# Patient Record
Sex: Male | Born: 1945 | Race: White | Hispanic: No | Marital: Married | Smoking: Never smoker
Health system: Southern US, Community
[De-identification: ages and names within clinical notes are randomized; demographics above are authoritative.]

## PROBLEM LIST (undated history)

## (undated) DIAGNOSIS — K219 Gastro-esophageal reflux disease without esophagitis: Secondary | ICD-10-CM

## (undated) DIAGNOSIS — E559 Vitamin D deficiency, unspecified: Secondary | ICD-10-CM

## (undated) DIAGNOSIS — C61 Malignant neoplasm of prostate: Secondary | ICD-10-CM

## (undated) DIAGNOSIS — R209 Unspecified disturbances of skin sensation: Secondary | ICD-10-CM

## (undated) DIAGNOSIS — S32401A Unspecified fracture of right acetabulum, initial encounter for closed fracture: Secondary | ICD-10-CM

## (undated) DIAGNOSIS — I10 Essential (primary) hypertension: Secondary | ICD-10-CM

## (undated) HISTORY — DX: Unspecified disturbances of skin sensation: R20.9

## (undated) HISTORY — DX: Essential (primary) hypertension: I10

## (undated) HISTORY — DX: Vitamin D deficiency, unspecified: E55.9

## (undated) HISTORY — DX: Unspecified fracture of right acetabulum, initial encounter for closed fracture: S32.401A

## (undated) HISTORY — PX: TONSILLECTOMY: SHX5217

---

## 1999-09-20 ENCOUNTER — Encounter: Payer: Self-pay | Admitting: Emergency Medicine

## 1999-09-20 ENCOUNTER — Emergency Department (HOSPITAL_COMMUNITY): Admission: EM | Admit: 1999-09-20 | Discharge: 1999-09-20 | Payer: Self-pay | Admitting: Emergency Medicine

## 2004-03-19 ENCOUNTER — Ambulatory Visit (HOSPITAL_COMMUNITY): Admission: RE | Admit: 2004-03-19 | Discharge: 2004-03-19 | Payer: Self-pay | Admitting: Gastroenterology

## 2004-03-19 ENCOUNTER — Encounter (INDEPENDENT_AMBULATORY_CARE_PROVIDER_SITE_OTHER): Payer: Self-pay | Admitting: Specialist

## 2004-05-05 ENCOUNTER — Encounter: Admission: RE | Admit: 2004-05-05 | Discharge: 2004-05-05 | Payer: Self-pay | Admitting: Family Medicine

## 2005-11-08 ENCOUNTER — Encounter: Admission: RE | Admit: 2005-11-08 | Discharge: 2005-11-08 | Payer: Self-pay | Admitting: Endocrinology

## 2007-01-10 ENCOUNTER — Encounter: Admission: RE | Admit: 2007-01-10 | Discharge: 2007-01-10 | Payer: Self-pay | Admitting: Otolaryngology

## 2009-01-05 ENCOUNTER — Ambulatory Visit (HOSPITAL_COMMUNITY): Admission: RE | Admit: 2009-01-05 | Discharge: 2009-01-05 | Payer: Self-pay | Admitting: *Deleted

## 2009-10-04 ENCOUNTER — Encounter: Payer: Self-pay | Admitting: Internal Medicine

## 2009-10-06 ENCOUNTER — Encounter: Payer: Self-pay | Admitting: Internal Medicine

## 2009-12-06 ENCOUNTER — Ambulatory Visit: Payer: Self-pay | Admitting: Internal Medicine

## 2010-03-16 ENCOUNTER — Ambulatory Visit: Payer: Self-pay | Admitting: Internal Medicine

## 2010-03-16 DIAGNOSIS — R209 Unspecified disturbances of skin sensation: Secondary | ICD-10-CM

## 2010-03-16 HISTORY — DX: Unspecified disturbances of skin sensation: R20.9

## 2010-04-19 ENCOUNTER — Encounter: Payer: Self-pay | Admitting: Internal Medicine

## 2010-07-31 NOTE — Letter (Signed)
Summary: PATIENT HX FORMS  PATIENT HX FORMS   Imported By: Georgian Co 04/19/2010 15:27:47  _____________________________________________________________________  External Attachment:    Type:   Image     Comment:   External Document

## 2010-07-31 NOTE — Assessment & Plan Note (Signed)
Summary: burning sensation in genital area for 2 days/cjr   Vital Signs:  Patient profile:   65 year old male Weight:      152 pounds Temp:     98.3 degrees F oral BP sitting:   120 / 78  (right arm) Cuff size:   regular  Vitals Entered By: Duard Brady LPN (March 16, 2010 11:05 AM) CC: c/o skin 'burning sensation'       **declines Flu vaccine Is Patient Diabetic? No   CC:  c/o skin 'burning sensation'       **declines Flu vaccine.  History of Present Illness: 65 year old patient who is seen today complain of some burning type sensation in the lower abdominal area.  He describes a burning, sensation it's very superficial in the skin region.  He also notes a similar sensation in the legs and left shoulder.  This was a similar complaints last spring and his previous PCP ordered LS films and laboratory evaluation, which was nonrevealing.  He states the discomfort has been present for two days.  He denies any bowel or bladder symptoms.  Allergies (verified): No Known Drug Allergies  Past History:  Past Medical History: Reviewed history from 12/06/2009 and no changes required. history of hypertension  Review of Systems  The patient denies anorexia, fever, weight loss, weight gain, vision loss, decreased hearing, hoarseness, chest pain, syncope, dyspnea on exertion, peripheral edema, prolonged cough, headaches, hemoptysis, abdominal pain, melena, hematochezia, severe indigestion/heartburn, hematuria, incontinence, genital sores, muscle weakness, suspicious skin lesions, transient blindness, difficulty walking, depression, unusual weight change, abnormal bleeding, enlarged lymph nodes, angioedema, breast masses, and testicular masses.    Physical Exam  General:  Well-developed,well-nourished,in no acute distress; alert,appropriate and cooperative throughout examination Head:  Normocephalic and atraumatic without obvious abnormalities. No apparent alopecia or balding. Eyes:   No corneal or conjunctival inflammation noted. EOMI. Perrla. Funduscopic exam benign, without hemorrhages, exudates or papilledema. Vision grossly normal. Mouth:  Oral mucosa and oropharynx without lesions or exudates.  Teeth in good repair. Neck:  No deformities, masses, or tenderness noted. Lungs:  Normal respiratory effort, chest expands symmetrically. Lungs are clear to auscultation, no crackles or wheezes. Heart:  Normal rate and regular rhythm. S1 and S2 normal without gallop, murmur, click, rub or other extra sounds. Abdomen:  Bowel sounds positive,abdomen soft and non-tender without masses, organomegaly or hernias noted. Msk:  No deformity or scoliosis noted of thoracic or lumbar spine.   Extremities:  No clubbing, cyanosis, edema, or deformity noted with normal full range of motion of all joints.   Neurologic:  alert & oriented X3, cranial nerves II-XII intact, strength normal in all extremities, sensation intact to pinprick, gait normal, and DTRs symmetrical and normal.     Impression & Recommendations:  Problem # 1:  PARESTHESIA (ICD-782.0)  Complete Medication List: 1)  No Rx Current Rx Meds   Patient Instructions: 1)  Please schedule a follow-up appointment in 6 months for Annual Exam  2)  Limit your Sodium (Salt) to less than 2 grams a day(slightly less than 1/2 a teaspoon) to prevent fluid retention, swelling, or worsening of symptoms. 3)  It is important that you exercise regularly at least 20 minutes 5 times a week. If you develop chest pain, have severe difficulty breathing, or feel very tired , stop exercising immediately and seek medical attention. 4)  Take 400-600mg  of Ibuprofen (Advil, Motrin) with food every 4-6 hours as needed for relief of pain or comfort of fever.

## 2010-07-31 NOTE — Assessment & Plan Note (Signed)
Summary: to be est/njr   Vital Signs:  Patient profile:   65 year old male Height:      66.25 inches Weight:      150 pounds BMI:     24.12 Temp:     98.0 degrees F oral BP sitting:   126 / 80  (right arm) Cuff size:   regular  Vitals Entered By: Duard Brady LPN (December 07, 1658 9:30 AM) CC: new to establish Is Patient Diabetic? No   CC:  new to establish.  History of Present Illness: 65 year old patient who is seen today to establish with our  practice.  he has a history of hypertension, which has been clean with Benicar in the past, but presently is followed off medication.  He takes no chronic medications.  He feels well today without concerns or complaints.  He did have a screening colonoscopy.  Last year.  Past medical history is otherwise unremarkable. He states that he has had some lower abdominal burning discomfort that has improved greatly since he has cut down on his coffee consumption.  Preventive Screening-Counseling & Management  Alcohol-Tobacco     Smoking Status: never  Allergies (verified): No Known Drug Allergies  Past History:  Past Medical History: history of hypertension  Past Surgical History: Tonsillectomy 1955  Family History: Reviewed history and no changes required. father died in his 90s, probable coronary artery disease mother is 65, resident of friends home 4 sisters are in good health  Social History: Reviewed history and no changes required. 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    Physical Exam  General:  Well-developed,well-nourished,in no acute distress; alert,appropriate and cooperative throughout examination; 130/80 Eyes:  No corneal or conjunctival inflammation noted. EOMI. Perrla. Funduscopic exam benign, without hemorrhages, exudates or papilledema. Vision grossly normal. Ears:  External ear exam shows no significant lesions or deformities.  Otoscopic examination reveals clear canals, tympanic membranes are intact bilaterally without bulging, retraction, inflammation or discharge. Hearing is grossly normal bilaterally. Nose:  External nasal examination shows no deformity or inflammation. Nasal mucosa are pink and moist without lesions or exudates. Mouth:  Oral mucosa and oropharynx without lesions or exudates.  Teeth in good repair. Neck:  No deformities, masses, or tenderness noted. Chest Wall:  No deformities, masses, tenderness or gynecomastia noted. Breasts:  No masses or gynecomastia noted Lungs:  Normal respiratory effort, chest expands symmetrically. Lungs are clear to auscultation, no crackles or wheezes. Heart:  Normal rate and regular rhythm. S1 and S2 normal without gallop, murmur, click, rub or other extra sounds. Abdomen:  Bowel sounds positive,abdomen soft and non-tender without masses, organomegaly or hernias noted. Rectal:  No external abnormalities noted. Normal sphincter tone. No rectal masses or tenderness. Genitalia:  Testes bilaterally descended without nodularity, tenderness or masses. No scrotal masses or lesions. No penis lesions or urethral discharge. Prostate:  Prostate gland firm and smooth, no enlargement, nodularity, tenderness, mass, asymmetry or induration. Msk:  No deformity or scoliosis noted of thoracic or lumbar spine.   Pulses:  R and L carotid,radial,femoral,dorsalis pedis and posterior tibial pulses are full and  equal  bilaterally Extremities:  No clubbing, cyanosis, edema, or deformity noted with normal full range of motion of all joints.   Neurologic:  No cranial nerve deficits noted. Station and gait are normal. Plantar reflexes are down-going bilaterally. DTRs are symmetrical throughout. Sensory, motor and coordinative functions appear intact. Skin:  Intact without suspicious lesions or rashes Cervical Nodes:  No lymphadenopathy noted Axillary Nodes:  No palpable lymphadenopathy Inguinal Nodes:  No significant adenopathy Psych:  Cognition and judgment appear intact. Alert and cooperative with normal attention span and concentration. No apparent delusions, illusions, hallucinations   Impression & Recommendations:  Problem # 1:  Preventive Health Care (ICD-V70.0)  Complete Medication List: 1)  No Rx Current Rx Meds   Patient Instructions: 1)  It is important that you exercise regularly at least 20 minutes 5 times a week. If you develop chest pain, have severe difficulty breathing, or feel very tired , stop exercising immediately and seek medical attention. 2)  Check your Blood Pressure regularly. If it is above: 150/90 you should make an appointment. 3)  Please schedule a follow-up appointment in 1 year.

## 2010-07-31 NOTE — Letter (Signed)
Summary: Records from [facility] 2010 - 2011  Records from [facility] 2010 - 2011   Imported By: Maryln Gottron 12/27/2009 15:18:21  _____________________________________________________________________  External Attachment:    Type:   Image     Comment:   External Document

## 2010-09-11 ENCOUNTER — Encounter: Payer: Self-pay | Admitting: Internal Medicine

## 2010-09-12 ENCOUNTER — Ambulatory Visit (INDEPENDENT_AMBULATORY_CARE_PROVIDER_SITE_OTHER): Payer: BC Managed Care – PPO | Admitting: Internal Medicine

## 2010-09-12 ENCOUNTER — Encounter: Payer: Self-pay | Admitting: Internal Medicine

## 2010-09-12 VITALS — BP 122/80 | Temp 97.9°F | Wt 152.0 lb

## 2010-09-12 DIAGNOSIS — R42 Dizziness and giddiness: Secondary | ICD-10-CM

## 2010-09-12 MED ORDER — MECLIZINE HCL 25 MG PO TABS: 25.0000 mg | ORAL_TABLET | Freq: Three times a day (TID) | ORAL | Status: DC | PRN

## 2010-09-12 NOTE — Progress Notes (Signed)
  Subjective:    Patient ID: Leslie Cox, male    DOB: [birth year], 65 y.o.   MRN: [id number]  HPI   65 year old patient who presents with two-day history of some cough and hoarseness. His chief complaint is positional dizziness he describes a sense of mild spinning and disequilibrium. No nausea or falls. No fever. No prior history of vertigo. He takes no chronic medications.    Review of Systems  Constitutional: Negative for fever, chills, appetite change and fatigue.  HENT: Positive for congestion. Negative for hearing loss, ear pain, sore throat, trouble swallowing, neck stiffness, dental problem, voice change and tinnitus.   Eyes: Negative for pain, discharge and visual disturbance.  Respiratory: Negative for cough, chest tightness, wheezing and stridor.   Cardiovascular: Negative for chest pain, palpitations and leg swelling.  Gastrointestinal: Negative for nausea, vomiting, abdominal pain, diarrhea, constipation, blood in stool and abdominal distention.  Genitourinary: Negative for urgency, hematuria, flank pain, discharge, difficulty urinating and genital sores.  Musculoskeletal: Negative for myalgias, back pain, joint swelling, arthralgias and gait problem.  Skin: Negative for rash.  Neurological: Positive for light-headedness. Negative for dizziness, syncope, speech difficulty, weakness, numbness and headaches.  Hematological: Negative for adenopathy. Does not bruise/bleed easily.  Psychiatric/Behavioral: Negative for behavioral problems and dysphoric mood. The patient is not nervous/anxious.        Objective:   Physical Exam  Constitutional: He is oriented to person, place, and time. He appears well-developed and well-nourished. No distress.  HENT:  Head: Normocephalic and atraumatic.  Right Ear: External ear normal.  Left Ear: External ear normal.  Nose: Nose normal.  Mouth/Throat: Oropharynx is clear and moist.  Eyes: Conjunctivae and EOM are normal. Pupils are equal,  round, and reactive to light.  Neck: Normal range of motion.  Cardiovascular: Normal rate and normal heart sounds.   Pulmonary/Chest: Breath sounds normal.  Abdominal: Bowel sounds are normal.  Musculoskeletal: Normal range of motion. He exhibits no edema and no tenderness.  Neurological: He is alert and oriented to person, place, and time.        Finger to nose testing normal  Psychiatric: He has a normal mood and affect. His behavior is normal.          Assessment & Plan:   URI with mild associated vertigo. He has been asked to take a once a day nonsedating antihistamine such as Claritin Alavert or Allegra. He was given a prescription for meclizine if symptoms worsen. He will be seen later this year for his annual physical

## 2010-09-12 NOTE — Patient Instructions (Signed)
Call or return to clinic prn if these symptoms worsen or fail to improve as anticipated.   Annual exam later this year as scheduled

## 2010-11-13 NOTE — Op Note (Signed)
NAME:  Leslie Cox, Leslie Cox NO.:  [id number]   MEDICAL RECORD NO.:  [id number]          PATIENT TYPE:  AMB   LOCATION:  ENDO                         FACILITY:  [facility]   PHYSICIAN:  Georgiana Spinner, M.D.    DATE OF BIRTH:  December 26, 1945   DATE OF PROCEDURE:  DATE OF DISCHARGE:                               OPERATIVE REPORT   PROCEDURE:  Colonoscopy.   INDICATIONS:  Colon cancer screening.   ANESTHESIA:  Fentanyl 50 mcg, Versed 5 mg.   PROCEDURE:  With the patient mildly sedated in the left lateral  decubitus position, a rectal exam was performed which was unremarkable.  The prostate felt slightly enlarged, but non-nodular and nontender.  Subsequently, the Pentax videoscopic pediatric colonoscope was inserted  in the rectum and passed under direct vision to the cecum, identified by  ileocecal valve and appendiceal orifice both of which were photographed.  From this point, the colonoscope was slowly withdrawn taking  circumferential views of colonic mucosa, stopping only in the rectum  which appeared normal on direct and retroflexed view.  The endoscope was  straightened and withdrawn.  The patient's vital signs and pulse  oximeter remained stable.  The patient tolerated procedure well without  apparent complications.   FINDINGS:  Negative examination.   PLAN:  Consider repeat examination in 5-10 years.           ______________________________  Georgiana Spinner, M.D.     GMO/MEDQ  D:  01/05/2009  T:  01/05/2009  Job:  [id number]

## 2010-11-16 NOTE — Op Note (Signed)
NAME:  Leslie Cox, Leslie Cox                        ACCOUNT NO.:  [id number]   MEDICAL RECORD NO.:  [id number]                   PATIENT TYPE:  AMB   LOCATION:  ENDO                                 FACILITY[facility]  [facility]   PHYSICIAN:  Danise Edge, M.D.                DATE OF BIRTH:  1946-01-17   DATE OF PROCEDURE:  03/19/2004  DATE OF DISCHARGE:                                 OPERATIVE REPORT   PROCEDURE:  Screening colonoscopy.   INDICATIONS FOR PROCEDURE:  Mr. Leslie Cox is a 65 year old male born  1945-11-17.  Mr. Leslie Cox is scheduled to undergo his first screening  colonoscopy with polypectomy to prevent colon cancer.   ENDOSCOPIST:  Charolett Bumpers, M.D.   PREMEDICATION:  Versed 5 mg, Demerol 50 mg.   PROCEDURE:  After obtaining confirmed consent, Leslie Cox was placed in the  left lateral decubitus position.  I administered intravenous Demerol and  intravenous Versed to achieve conscious sedation for the procedure.  The  patient's blood pressure, oxygen saturation, and cardiac rhythm were  monitored throughout the procedure and documented in the medical record.  Anal inspection and digital rectal exam were normal.  The Olympus pediatric  video colonoscope was introduced into the rectum and advanced to the cecum.  Colonic preparation for the exam today was excellent.   Rectum normal.  Sigmoid colon and descending colon:  From the distal descending colon at 50  cm from the anal verge, a 2 mm sessile polyp was removed with the cold  biopsy forceps.  Splenic flexure normal.  Transverse colon normal.  Hepatic flexure normal.  Ascending colon normal.  Cecum and ileocecal valve normal.   ASSESSMENT:  A small polyp was removed from the distal descending colon;  otherwise normal screening proctocolonoscopy to the cecum.     MJ/MEDQ  D:  03/19/2004  T:  03/19/2004  Job:  [id number]   cc:   Donia Guiles, M.D.  [address] [place]  [place] [address]  Fax: [phone]

## 2010-12-18 ENCOUNTER — Telehealth: Payer: Self-pay

## 2010-12-18 NOTE — Telephone Encounter (Signed)
Took overhead page call - c/o chest discomfort x1wk , no SOB , no Gerd , c/o (R) shoulder and hand tingling on/off. Offer appt today declined - appt made for tomorrow 915AM

## 2010-12-19 ENCOUNTER — Encounter: Payer: Self-pay | Admitting: Internal Medicine

## 2010-12-19 ENCOUNTER — Ambulatory Visit (INDEPENDENT_AMBULATORY_CARE_PROVIDER_SITE_OTHER): Payer: BC Managed Care – PPO | Admitting: Internal Medicine

## 2010-12-19 VITALS — BP 140/82 | Temp 97.8°F | Wt 151.0 lb

## 2010-12-19 DIAGNOSIS — R0789 Other chest pain: Secondary | ICD-10-CM

## 2010-12-19 NOTE — Progress Notes (Signed)
  Subjective:    Patient ID: Leslie Cox, male    DOB: [birth year], 65 y.o.   MRN: [id number]  HPI   65 year old patient who presents with a one-week history of chest discomfort. Last week this was present in the right upper anterior chest just beneath his right shoulder.  More recently it is present in the right mid chest region. He feels this is a muscular pain in this area is often tender. Denies any exertional chest pain. He has no cardiac risk factors. He feels it's a muscle strain. He has had minimal benefit with ibuprofen and especially Tylenol. Denies any shortness breath nausea diaphoresis or other complaints   Review of Systems  Respiratory: Negative for shortness of breath.   Cardiovascular: Positive for chest pain. Negative for palpitations.       Objective:   Physical Exam  Constitutional: He is oriented to person, place, and time. He appears well-developed.  HENT:  Head: Normocephalic.  Right Ear: External ear normal.  Left Ear: External ear normal.  Eyes: Conjunctivae and EOM are normal.  Neck: Normal range of motion.  Cardiovascular: Normal rate and normal heart sounds.   Pulmonary/Chest: Effort normal and breath sounds normal. No respiratory distress. He has no wheezes. He has no rales. He exhibits no tenderness.       Suggestion of tenderness along the right anterior chest  Abdominal: Bowel sounds are normal.  Musculoskeletal: Normal range of motion. He exhibits no edema and no tenderness.  Neurological: He is alert and oriented to person, place, and time.  Psychiatric: He has a normal mood and affect. His behavior is normal.          Assessment & Plan:   Chest wall pain. We'll treat with VIMOVO  twice a day and clinically observe

## 2010-12-19 NOTE — Patient Instructions (Signed)
Call or return to clinic prn if these symptoms worsen or fail to improve as anticipated.  Vimovo 1 twice daily

## 2011-02-07 ENCOUNTER — Other Ambulatory Visit (INDEPENDENT_AMBULATORY_CARE_PROVIDER_SITE_OTHER): Payer: BC Managed Care – PPO

## 2011-02-07 DIAGNOSIS — Z0000 Encounter for general adult medical examination without abnormal findings: Secondary | ICD-10-CM

## 2011-02-07 LAB — CBC WITH DIFFERENTIAL/PLATELET
Basophils Absolute: 0 10*3/uL (ref 0.0–0.1)
Eosinophils Absolute: 0.1 10*3/uL (ref 0.0–0.7)
Eosinophils Relative: 1.8 % (ref 0.0–5.0)
HCT: 45.6 % (ref 39.0–52.0)
Lymphs Abs: 1.3 10*3/uL (ref 0.7–4.0)
MCHC: 33.6 g/dL (ref 30.0–36.0)
MCV: 91.9 fl (ref 78.0–100.0)
Monocytes Absolute: 0.5 10*3/uL (ref 0.1–1.0)
Neutrophils Relative %: 67 % (ref 43.0–77.0)
Platelets: 236 10*3/uL (ref 150.0–400.0)
RDW: 12.9 % (ref 11.5–14.6)
WBC: 5.6 10*3/uL (ref 4.5–10.5)

## 2011-02-07 LAB — LIPID PANEL
Cholesterol: 198 mg/dL (ref 0–200)
HDL: 37.4 mg/dL — ABNORMAL LOW (ref >39.00)
LDL Cholesterol: 127 mg/dL — ABNORMAL HIGH (ref 0–99)
Total CHOL/HDL Ratio: 5
Triglycerides: 168 mg/dL — ABNORMAL HIGH (ref 0.0–149.0)
VLDL: 33.6 mg/dL (ref 0.0–40.0)

## 2011-02-07 LAB — BASIC METABOLIC PANEL
BUN: 11 mg/dL (ref 6–23)
Chloride: 102 mEq/L (ref 96–112)
Creatinine, Ser: 0.7 mg/dL (ref 0.4–1.5)
Glucose, Bld: 98 mg/dL (ref 70–99)

## 2011-02-07 LAB — HEPATIC FUNCTION PANEL
Bilirubin, Direct: 0.1 mg/dL (ref 0.0–0.3)
Total Bilirubin: 1.2 mg/dL (ref 0.3–1.2)

## 2011-02-07 LAB — POCT URINALYSIS DIPSTICK
Blood, UA: NEGATIVE
Nitrite, UA: NEGATIVE
Spec Grav, UA: 1.015
Urobilinogen, UA: 0.2
pH, UA: 7

## 2011-02-07 LAB — TSH: TSH: 2.24 u[IU]/mL (ref 0.35–5.50)

## 2011-02-14 ENCOUNTER — Ambulatory Visit (INDEPENDENT_AMBULATORY_CARE_PROVIDER_SITE_OTHER): Payer: BC Managed Care – PPO | Admitting: Internal Medicine

## 2011-02-14 ENCOUNTER — Encounter: Payer: Self-pay | Admitting: Internal Medicine

## 2011-02-14 VITALS — BP 120/80 | HR 80 | Temp 98.1°F | Resp 18 | Ht 66.0 in | Wt 148.0 lb

## 2011-02-14 DIAGNOSIS — Z2911 Encounter for prophylactic immunotherapy for respiratory syncytial virus (RSV): Secondary | ICD-10-CM

## 2011-02-14 DIAGNOSIS — Z23 Encounter for immunization: Secondary | ICD-10-CM

## 2011-02-14 DIAGNOSIS — Z0000 Encounter for general adult medical examination without abnormal findings: Secondary | ICD-10-CM

## 2011-02-14 NOTE — Patient Instructions (Addendum)
Limit your sodium (Salt) intake    It is important that you exercise regularly, at least 20 minutes 3 to 4 times per week.  If you develop chest pain or shortness of breath seek  medical attention.  Return in one year for follow-up   Please check your blood pressure on a regular basis.  If it is consistently greater than 150/90, please make an office appointment.   

## 2011-02-14 NOTE — Progress Notes (Signed)
Subjective:    Patient ID: Leslie Cox, male    DOB: [birth year], 65 y.o.   MRN: [id number]  HPI  65 year old patient who is seen today for a wellness exam. He enjoys excellent health. There has been a history of hypertension in the past but he has been successfully tapered off medication. He does monitor blood pressure with normal results.   Preventive Screening-Counseling & Management  Alcohol-Tobacco  Smoking Status: never   Allergies (verified):  No Known Drug Allergies   Past History:  Past Medical History:  history of hypertension   Past Surgical History:  Tonsillectomy 1955   Family History:  Reviewed history and no changes required.  father died in his 34s, probable coronary artery disease  mother is 22, resident of friends home  4 sisters are in good health   Social History:  Reviewed history and no changes required.  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  HENT: Negative for hearing loss, ear pain, congestion, rhinorrhea, sneezing, mouth sores, trouble swallowing, neck pain, neck stiffness, dental problem, voice change, sinus pressure and tinnitus.   Eyes: Negative for photophobia, pain, redness and visual disturbance.  Respiratory: Negative for apnea, cough, choking, chest tightness, shortness of breath and wheezing.   Cardiovascular: Negative for chest pain, palpitations and leg swelling.  Gastrointestinal: Negative for nausea, vomiting, abdominal pain, diarrhea, constipation, blood in stool, abdominal distention, anal bleeding and rectal pain.  Genitourinary: Negative for dysuria, urgency, frequency, hematuria, flank pain, decreased urine volume, discharge, penile swelling, scrotal swelling, difficulty urinating, genital sores and testicular pain.  Musculoskeletal: Negative for myalgias, back pain, joint swelling, arthralgias  and gait problem.  Skin: Negative for color change, rash and wound.  Neurological: Negative for dizziness, tremors, seizures, syncope, facial asymmetry, speech difficulty, weakness, light-headedness, numbness and headaches.  Hematological: Negative for adenopathy. Does not bruise/bleed easily.  Psychiatric/Behavioral: Negative for suicidal ideas, hallucinations, behavioral problems, confusion, sleep disturbance, self-injury, dysphoric mood, decreased concentration and agitation. The patient is not nervous/anxious.        Objective:   Physical Exam  Constitutional: He appears well-developed and well-nourished.  HENT:  Head: Normocephalic and atraumatic.  Right Ear: External ear normal.  Left Ear: External ear normal.  Nose: Nose normal.  Mouth/Throat: Oropharynx is clear and moist.  Eyes: Conjunctivae and EOM are normal. Pupils are equal, round, and reactive to light. No scleral icterus.  Neck: Normal range of motion. Neck supple. No JVD present. No thyromegaly present.  Cardiovascular: Regular rhythm, normal heart sounds and intact distal pulses.  Exam reveals no gallop and no friction rub.   No murmur heard. Pulmonary/Chest: Effort normal and breath sounds normal. He exhibits no tenderness.  Abdominal: Soft. Bowel sounds are normal. He exhibits no distension and no mass. There is no tenderness.  Genitourinary: Prostate normal and penis normal.  Musculoskeletal: Normal range of motion. He exhibits no edema and no tenderness.  Lymphadenopathy:    He has no cervical adenopathy.  Neurological: He is alert. He has normal reflexes. No cranial nerve deficit. Coordination normal.  Skin: Skin is warm and dry. No rash noted.  Psychiatric: He has a normal mood and affect. His behavior is normal.          Assessment & Plan:   Preventive health examination Normotensive  Exercise regimen low salt diet all encouraged. He will return in one year for followup. He's been asked  to monitor his  blood pressure readings

## 2012-01-08 ENCOUNTER — Ambulatory Visit: Payer: BC Managed Care – PPO | Admitting: Internal Medicine

## 2012-02-26 ENCOUNTER — Other Ambulatory Visit (INDEPENDENT_AMBULATORY_CARE_PROVIDER_SITE_OTHER): Payer: BC Managed Care – PPO

## 2012-02-26 DIAGNOSIS — Z0000 Encounter for general adult medical examination without abnormal findings: Secondary | ICD-10-CM

## 2012-02-26 LAB — BASIC METABOLIC PANEL
CO2: 27 mEq/L (ref 19–32)
Calcium: 9.2 mg/dL (ref 8.4–10.5)
Chloride: 105 mEq/L (ref 96–112)
Creatinine, Ser: 0.7 mg/dL (ref 0.4–1.5)
Glucose, Bld: 98 mg/dL (ref 70–99)

## 2012-02-26 LAB — LIPID PANEL
Cholesterol: 180 mg/dL (ref 0–200)
HDL: 34.9 mg/dL — ABNORMAL LOW (ref >39.00)
VLDL: 16.8 mg/dL (ref 0.0–40.0)

## 2012-02-26 LAB — CBC WITH DIFFERENTIAL/PLATELET
Basophils Absolute: 0 10*3/uL (ref 0.0–0.1)
HCT: 44.5 % (ref 39.0–52.0)
Lymphs Abs: 1.2 10*3/uL (ref 0.7–4.0)
MCHC: 33 g/dL (ref 30.0–36.0)
MCV: 92.6 fl (ref 78.0–100.0)
Monocytes Absolute: 0.4 10*3/uL (ref 0.1–1.0)
Platelets: 210 10*3/uL (ref 150.0–400.0)
RDW: 12.5 % (ref 11.5–14.6)

## 2012-02-26 LAB — POCT URINALYSIS DIPSTICK
Bilirubin, UA: NEGATIVE
Glucose, UA: NEGATIVE
Ketones, UA: NEGATIVE
Leukocytes, UA: NEGATIVE
pH, UA: 7

## 2012-02-26 LAB — HEPATIC FUNCTION PANEL
ALT: 17 U/L (ref 0–53)
Total Bilirubin: 0.8 mg/dL (ref 0.3–1.2)

## 2012-02-26 LAB — TSH: TSH: 1.63 u[IU]/mL (ref 0.35–5.50)

## 2012-03-04 ENCOUNTER — Encounter: Payer: Self-pay | Admitting: Internal Medicine

## 2012-03-04 ENCOUNTER — Ambulatory Visit (INDEPENDENT_AMBULATORY_CARE_PROVIDER_SITE_OTHER): Payer: BC Managed Care – PPO | Admitting: Internal Medicine

## 2012-03-04 VITALS — BP 130/80 | HR 80 | Temp 97.6°F | Resp 20 | Ht 66.0 in | Wt 149.0 lb

## 2012-03-04 DIAGNOSIS — Z0000 Encounter for general adult medical examination without abnormal findings: Secondary | ICD-10-CM

## 2012-03-04 NOTE — Patient Instructions (Addendum)
It is important that you exercise regularly, at least 20 minutes 3 to 4 times per week.  If you develop chest pain or shortness of breath seek  medical attention.  Return in one year for follow-up   

## 2012-03-04 NOTE — Progress Notes (Signed)
Subjective:    Patient ID: Leslie Cox, male    DOB: 07-Jun-1946, 66 y.o.   MRN: [id number]  HPI Subjective:    Patient ID: Leslie Cox, male    DOB: March 19, 1946, 66 y.o.   MRN: [id number]  HPI  66 year old patient who is seen today for a wellness exam. He enjoys excellent health. There has been a history of hypertension in the past but he has been successfully tapered off medication. He does monitor blood pressure with normal results. His only complaint today or concern is excessive flatus. He did have a screening colonoscopy by Dr. Laural Benes in 2005     Preventive Screening-Counseling & Management  Alcohol-Tobacco  Smoking Status: never   Allergies (verified):  No Known Drug Allergies   Past History:  Past Medical History:  history of hypertension   Past Surgical History:  Tonsillectomy 1955  Colonoscopy 2005  Family History:  Reviewed history and no changes required.  father died in his 33s, probable coronary artery disease  mother is 61, resident of [facility]  4 sisters are in good health   Social History:  Reviewed history and no changes required.  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  HENT: Negative for hearing loss, ear pain, congestion, rhinorrhea, sneezing, mouth sores, trouble swallowing, neck pain, neck stiffness, dental problem, voice change, sinus pressure and tinnitus.   Eyes: Negative for photophobia, pain, redness and visual disturbance.  Respiratory: Negative for apnea, cough, choking, chest tightness, shortness of breath and wheezing.   Cardiovascular: Negative for chest pain, palpitations and leg swelling.  Gastrointestinal: Negative for nausea, vomiting, abdominal pain, diarrhea, constipation, blood in stool, abdominal distention, anal bleeding and rectal pain.  Genitourinary: Negative for dysuria, urgency,  frequency, hematuria, flank pain, decreased urine volume, discharge, penile swelling, scrotal swelling, difficulty urinating, genital sores and testicular pain.  Musculoskeletal: Negative for myalgias, back pain, joint swelling, arthralgias and gait problem.  Skin: Negative for color change, rash and wound.  Neurological: Negative for dizziness, tremors, seizures, syncope, facial asymmetry, speech difficulty, weakness, light-headedness, numbness and headaches.  Hematological: Negative for adenopathy. Does not bruise/bleed easily.  Psychiatric/Behavioral: Negative for suicidal ideas, hallucinations, behavioral problems, confusion, sleep disturbance, self-injury, dysphoric mood, decreased concentration and agitation. The patient is not nervous/anxious.        Objective:   Physical Exam  Constitutional: He appears well-developed and well-nourished.  HENT:  Head: Normocephalic and atraumatic.  Right Ear: External ear normal.  Left Ear: External ear normal.  Nose: Nose normal.  Mouth/Throat: Oropharynx is clear and moist.  Eyes: Conjunctivae and EOM are normal. Pupils are equal, round, and reactive to light. No scleral icterus.  Neck: Normal range of motion. Neck supple. No JVD present. No thyromegaly present.  Cardiovascular: Regular rhythm, normal heart sounds and intact distal pulses.  Exam reveals no gallop and no friction rub.   No murmur heard. Pulmonary/Chest: Effort normal and breath sounds normal. He exhibits no tenderness.  Abdominal: Soft. Bowel sounds are normal. He exhibits no distension and no mass. There is no tenderness.  Genitourinary: Prostate normal and penis normal.  Musculoskeletal: Normal range of motion. He exhibits no edema and no tenderness.  Lymphadenopathy:    He has no cervical adenopathy.  Neurological: He is alert. He has normal reflexes. No cranial nerve deficit. Coordination normal.  Skin: Skin is warm and dry. No rash noted.  Psychiatric: He has  a normal mood  and affect. His behavior is normal.          Assessment & Plan:   Preventive health examination Normotensive  Exercise regimen low salt diet all encouraged. He will return in one year for followup. He's been asked to monitor his blood pressure readings    Review of Systems     Objective:   Physical Exam        Assessment & Plan:

## 2012-09-05 ENCOUNTER — Inpatient Hospital Stay (HOSPITAL_COMMUNITY): Payer: BC Managed Care – PPO

## 2012-09-05 ENCOUNTER — Inpatient Hospital Stay: Admit: 2012-09-05 | Payer: Self-pay | Admitting: Orthopedic Surgery

## 2012-09-05 ENCOUNTER — Encounter (HOSPITAL_COMMUNITY)
Admission: EM | Disposition: A | Discharge status: Rehabilitation Facility | Payer: Self-pay | Source: Home / Self Care | Attending: Orthopedic Surgery

## 2012-09-05 ENCOUNTER — Encounter (HOSPITAL_COMMUNITY): Payer: Self-pay

## 2012-09-05 ENCOUNTER — Inpatient Hospital Stay (HOSPITAL_COMMUNITY)
Admission: EM | Admit: 2012-09-05 | Discharge: 2012-09-14 | DRG: 233 | Disposition: A | Discharge status: Rehabilitation Facility | Payer: BC Managed Care – PPO | Attending: Orthopedic Surgery | Admitting: Orthopedic Surgery

## 2012-09-05 ENCOUNTER — Emergency Department (HOSPITAL_COMMUNITY): Payer: BC Managed Care – PPO

## 2012-09-05 ENCOUNTER — Inpatient Hospital Stay (HOSPITAL_COMMUNITY): Payer: BC Managed Care – PPO | Admitting: Anesthesiology

## 2012-09-05 ENCOUNTER — Encounter (HOSPITAL_COMMUNITY): Payer: Self-pay | Admitting: Anesthesiology

## 2012-09-05 DIAGNOSIS — K560 Paralytic ileus: Secondary | ICD-10-CM | POA: Diagnosis not present

## 2012-09-05 DIAGNOSIS — R338 Other retention of urine: Secondary | ICD-10-CM | POA: Diagnosis not present

## 2012-09-05 DIAGNOSIS — W010XXA Fall on same level from slipping, tripping and stumbling without subsequent striking against object, initial encounter: Secondary | ICD-10-CM | POA: Diagnosis present

## 2012-09-05 DIAGNOSIS — K9189 Other postprocedural complications and disorders of digestive system: Secondary | ICD-10-CM | POA: Diagnosis not present

## 2012-09-05 DIAGNOSIS — Y838 Other surgical procedures as the cause of abnormal reaction of the patient, or of later complication, without mention of misadventure at the time of the procedure: Secondary | ICD-10-CM | POA: Diagnosis not present

## 2012-09-05 DIAGNOSIS — R300 Dysuria: Secondary | ICD-10-CM | POA: Diagnosis not present

## 2012-09-05 DIAGNOSIS — N5089 Other specified disorders of the male genital organs: Secondary | ICD-10-CM | POA: Diagnosis not present

## 2012-09-05 DIAGNOSIS — S32409A Unspecified fracture of unspecified acetabulum, initial encounter for closed fracture: Principal | ICD-10-CM

## 2012-09-05 DIAGNOSIS — R339 Retention of urine, unspecified: Secondary | ICD-10-CM | POA: Diagnosis not present

## 2012-09-05 DIAGNOSIS — D62 Acute posthemorrhagic anemia: Secondary | ICD-10-CM | POA: Diagnosis not present

## 2012-09-05 DIAGNOSIS — E871 Hypo-osmolality and hyponatremia: Secondary | ICD-10-CM | POA: Diagnosis not present

## 2012-09-05 DIAGNOSIS — I10 Essential (primary) hypertension: Secondary | ICD-10-CM | POA: Diagnosis present

## 2012-09-05 DIAGNOSIS — E559 Vitamin D deficiency, unspecified: Secondary | ICD-10-CM | POA: Diagnosis present

## 2012-09-05 DIAGNOSIS — K219 Gastro-esophageal reflux disease without esophagitis: Secondary | ICD-10-CM | POA: Diagnosis present

## 2012-09-05 DIAGNOSIS — K929 Disease of digestive system, unspecified: Secondary | ICD-10-CM | POA: Diagnosis not present

## 2012-09-05 HISTORY — PX: EXTERNAL FIXATION LEG: SHX1549

## 2012-09-05 HISTORY — DX: Gastro-esophageal reflux disease without esophagitis: K21.9

## 2012-09-05 LAB — URINALYSIS, MICROSCOPIC ONLY
Glucose, UA: NEGATIVE mg/dL
Leukocytes, UA: NEGATIVE
Protein, ur: NEGATIVE mg/dL
pH: 7 (ref 5.0–8.0)

## 2012-09-05 LAB — BASIC METABOLIC PANEL
CO2: 28 mEq/L (ref 19–32)
Chloride: 103 mEq/L (ref 96–112)
Sodium: 139 mEq/L (ref 135–145)

## 2012-09-05 LAB — CBC
Platelets: 214 10*3/uL (ref 150–400)
RBC: 4.74 MIL/uL (ref 4.22–5.81)
WBC: 12.2 10*3/uL — ABNORMAL HIGH (ref 4.0–10.5)

## 2012-09-05 SURGERY — EXTERNAL FIXATION, LOWER EXTREMITY
Anesthesia: General | Site: Leg Lower | Laterality: Right | Wound class: Clean

## 2012-09-05 SURGERY — Surgical Case
Anesthesia: *Unknown

## 2012-09-05 MED ORDER — ONDANSETRON HCL 4 MG PO TABS: 4.0000 mg | ORAL_TABLET | Freq: Four times a day (QID) | ORAL | Status: DC | PRN

## 2012-09-05 MED ORDER — LIDOCAINE HCL (CARDIAC) 20 MG/ML IV SOLN
INTRAVENOUS | Status: DC | PRN
  Administered 2012-09-05: 30 mg via INTRAVENOUS

## 2012-09-05 MED ORDER — PROPOFOL 10 MG/ML IV BOLUS
INTRAVENOUS | Status: DC | PRN
  Administered 2012-09-05: 120 mg via INTRAVENOUS

## 2012-09-05 MED ORDER — MEPERIDINE HCL 25 MG/ML IJ SOLN: 6.2500 mg | INTRAMUSCULAR | Status: DC | PRN

## 2012-09-05 MED ORDER — SODIUM CHLORIDE 0.9 % IV BOLUS (SEPSIS)
500.0000 mL | Freq: Once | INTRAVENOUS | Status: AC
  Administered 2012-09-05: 500 mL via INTRAVENOUS

## 2012-09-05 MED ORDER — SODIUM CHLORIDE 0.9 % IV SOLN
INTRAVENOUS | Status: DC
  Filled 2012-09-05 (×6): qty 1000

## 2012-09-05 MED ORDER — MIDAZOLAM HCL 5 MG/5ML IJ SOLN
INTRAMUSCULAR | Status: DC | PRN
  Administered 2012-09-05: 2 mg via INTRAVENOUS

## 2012-09-05 MED ORDER — MIDAZOLAM HCL 2 MG/2ML IJ SOLN: 0.5000 mg | Freq: Once | INTRAMUSCULAR | Status: AC | PRN

## 2012-09-05 MED ORDER — SODIUM CHLORIDE 0.9 % IV SOLN: INTRAVENOUS | Status: DC

## 2012-09-05 MED ORDER — HYDROMORPHONE HCL PF 1 MG/ML IJ SOLN
0.2500 mg | INTRAMUSCULAR | Status: DC | PRN
  Administered 2012-09-08 (×2): 0.5 mg via INTRAVENOUS

## 2012-09-05 MED ORDER — HYDROMORPHONE HCL PF 1 MG/ML IJ SOLN
0.5000 mg | INTRAMUSCULAR | Status: DC | PRN
  Administered 2012-09-05: 1 mg via INTRAVENOUS
  Administered 2012-09-06: 2 mg via INTRAVENOUS
  Filled 2012-09-05: qty 2
  Filled 2012-09-05: qty 1

## 2012-09-05 MED ORDER — PHENOL 1.4 % MT LIQD: 1.0000 | OROMUCOSAL | Status: DC | PRN

## 2012-09-05 MED ORDER — ONDANSETRON HCL 4 MG/2ML IJ SOLN
INTRAMUSCULAR | Status: DC | PRN
  Administered 2012-09-05: 4 mg via INTRAVENOUS

## 2012-09-05 MED ORDER — SODIUM CHLORIDE 0.9 % IV SOLN
INTRAVENOUS | Status: DC
  Administered 2012-09-05 – 2012-09-09 (×4): via INTRAVENOUS
  Filled 2012-09-05 (×8): qty 1000

## 2012-09-05 MED ORDER — FENTANYL CITRATE 0.05 MG/ML IJ SOLN
50.0000 ug | Freq: Once | INTRAMUSCULAR | Status: AC
  Administered 2012-09-05: 50 ug via INTRAVENOUS
  Filled 2012-09-05: qty 2

## 2012-09-05 MED ORDER — ACETAMINOPHEN 325 MG PO TABS: 650.0000 mg | ORAL_TABLET | Freq: Four times a day (QID) | ORAL | Status: DC | PRN

## 2012-09-05 MED ORDER — POLYETHYLENE GLYCOL 3350 17 G PO PACK
17.0000 g | PACK | Freq: Every day | ORAL | Status: DC | PRN
  Filled 2012-09-05: qty 1

## 2012-09-05 MED ORDER — ENOXAPARIN SODIUM 40 MG/0.4ML ~~LOC~~ SOLN
40.0000 mg | SUBCUTANEOUS | Status: DC
  Filled 2012-09-05 (×2): qty 0.4

## 2012-09-05 MED ORDER — DOCUSATE SODIUM 100 MG PO CAPS
100.0000 mg | ORAL_CAPSULE | Freq: Two times a day (BID) | ORAL | Status: DC
  Administered 2012-09-05 – 2012-09-07 (×5): 100 mg via ORAL
  Filled 2012-09-05 (×6): qty 1

## 2012-09-05 MED ORDER — ACETAMINOPHEN 10 MG/ML IV SOLN
1000.0000 mg | Freq: Once | INTRAVENOUS | Status: AC
  Administered 2012-09-05: 1000 mg via INTRAVENOUS

## 2012-09-05 MED ORDER — BISACODYL 10 MG RE SUPP: 10.0000 mg | Freq: Every day | RECTAL | Status: DC | PRN

## 2012-09-05 MED ORDER — ONDANSETRON HCL 4 MG/2ML IJ SOLN: 4.0000 mg | Freq: Four times a day (QID) | INTRAMUSCULAR | Status: DC | PRN

## 2012-09-05 MED ORDER — MORPHINE SULFATE 2 MG/ML IJ SOLN: 0.5000 mg | INTRAMUSCULAR | Status: DC | PRN

## 2012-09-05 MED ORDER — OXYCODONE HCL 5 MG/5ML PO SOLN: 5.0000 mg | Freq: Once | ORAL | Status: AC | PRN

## 2012-09-05 MED ORDER — FLEET ENEMA 7-19 GM/118ML RE ENEM: 1.0000 | ENEMA | Freq: Once | RECTAL | Status: AC | PRN

## 2012-09-05 MED ORDER — METOCLOPRAMIDE HCL 10 MG PO TABS
5.0000 mg | ORAL_TABLET | Freq: Three times a day (TID) | ORAL | Status: DC | PRN
  Filled 2012-09-05: qty 1

## 2012-09-05 MED ORDER — PROMETHAZINE HCL 25 MG/ML IJ SOLN
6.2500 mg | INTRAMUSCULAR | Status: DC | PRN
  Administered 2012-09-08: 6.25 mg via INTRAVENOUS

## 2012-09-05 MED ORDER — ZOLPIDEM TARTRATE 5 MG PO TABS: 5.0000 mg | ORAL_TABLET | Freq: Every evening | ORAL | Status: DC | PRN

## 2012-09-05 MED ORDER — HYDROCODONE-ACETAMINOPHEN 5-325 MG PO TABS
1.0000 | ORAL_TABLET | ORAL | Status: DC | PRN
  Administered 2012-09-05 – 2012-09-08 (×7): 2 via ORAL
  Filled 2012-09-05 (×6): qty 2

## 2012-09-05 MED ORDER — ALUM & MAG HYDROXIDE-SIMETH 200-200-20 MG/5ML PO SUSP
30.0000 mL | Freq: Four times a day (QID) | ORAL | Status: DC | PRN
  Administered 2012-09-06: 30 mL via ORAL
  Filled 2012-09-05 (×2): qty 30

## 2012-09-05 MED ORDER — HYDROMORPHONE HCL PF 1 MG/ML IJ SOLN: 0.5000 mg | INTRAMUSCULAR | Status: DC | PRN

## 2012-09-05 MED ORDER — CEFAZOLIN SODIUM 1-5 GM-% IV SOLN
INTRAVENOUS | Status: DC | PRN
  Administered 2012-09-05: 1 g via INTRAVENOUS

## 2012-09-05 MED ORDER — OXYCODONE HCL 5 MG PO TABS
5.0000 mg | ORAL_TABLET | Freq: Once | ORAL | Status: AC | PRN
Start: 2012-09-05 — End: 2012-09-05

## 2012-09-05 MED ORDER — ENOXAPARIN SODIUM 40 MG/0.4ML ~~LOC~~ SOLN
40.0000 mg | SUBCUTANEOUS | Status: DC
  Administered 2012-09-06 – 2012-09-07 (×2): 40 mg via SUBCUTANEOUS
  Filled 2012-09-05 (×3): qty 0.4

## 2012-09-05 MED ORDER — MENTHOL 3 MG MT LOZG: 1.0000 | LOZENGE | OROMUCOSAL | Status: DC | PRN

## 2012-09-05 MED ORDER — HYDROCODONE-ACETAMINOPHEN 5-325 MG PO TABS
1.0000 | ORAL_TABLET | Freq: Four times a day (QID) | ORAL | Status: DC | PRN
  Administered 2012-09-06: 2 via ORAL
  Filled 2012-09-05 (×2): qty 2

## 2012-09-05 MED ORDER — EPHEDRINE SULFATE 50 MG/ML IJ SOLN
INTRAMUSCULAR | Status: DC | PRN
  Administered 2012-09-05: 10 mg via INTRAVENOUS

## 2012-09-05 MED ORDER — ACETAMINOPHEN 650 MG RE SUPP: 650.0000 mg | Freq: Four times a day (QID) | RECTAL | Status: DC | PRN

## 2012-09-05 MED ORDER — ONDANSETRON HCL 4 MG/2ML IJ SOLN: 4.0000 mg | Freq: Three times a day (TID) | INTRAMUSCULAR | Status: DC | PRN

## 2012-09-05 MED ORDER — METOCLOPRAMIDE HCL 5 MG/ML IJ SOLN
5.0000 mg | Freq: Three times a day (TID) | INTRAMUSCULAR | Status: DC | PRN
  Filled 2012-09-05: qty 2

## 2012-09-05 MED ORDER — LACTATED RINGERS IV SOLN
INTRAVENOUS | Status: DC | PRN
  Administered 2012-09-05 (×2): via INTRAVENOUS

## 2012-09-05 SURGICAL SUPPLY — 40 items
BLADE SURG CLIPPER 3M 9600 (MISCELLANEOUS) ×3 IMPLANT
BNDG COHESIVE 4X5 TAN STRL (GAUZE/BANDAGES/DRESSINGS) ×3 IMPLANT
BNDG COHESIVE 6X5 TAN STRL LF (GAUZE/BANDAGES/DRESSINGS) ×3 IMPLANT
CLOTH BEACON ORANGE TIMEOUT ST (SAFETY) ×3 IMPLANT
COVER SURGICAL LIGHT HANDLE (MISCELLANEOUS) ×3 IMPLANT
DRAPE STERI IOBAN 125X83 (DRAPES) ×3 IMPLANT
DRAPE SURG 17X23 STRL (DRAPES) ×6 IMPLANT
DRSG ADAPTIC 3X8 NADH LF (GAUZE/BANDAGES/DRESSINGS) ×3 IMPLANT
DRSG MEPILEX BORDER 4X4 (GAUZE/BANDAGES/DRESSINGS) ×3 IMPLANT
DURAPREP 26ML APPLICATOR (WOUND CARE) ×3 IMPLANT
ELECT REM PT RETURN 9FT ADLT (ELECTROSURGICAL) ×3
ELECTRODE REM PT RTRN 9FT ADLT (ELECTROSURGICAL) ×2 IMPLANT
FACESHIELD LNG OPTICON STERILE (SAFETY) IMPLANT
GAUZE SPONGE 2X2 8PLY STRL LF (GAUZE/BANDAGES/DRESSINGS) ×2 IMPLANT
GAUZE XEROFORM 1X8 LF (GAUZE/BANDAGES/DRESSINGS) ×3 IMPLANT
GLOVE BIOGEL PI IND STRL 7.5 (GLOVE) ×2 IMPLANT
GLOVE BIOGEL PI IND STRL 8 (GLOVE) IMPLANT
GLOVE BIOGEL PI INDICATOR 7.5 (GLOVE) ×1
GLOVE BIOGEL PI INDICATOR 8 (GLOVE)
GLOVE ORTHO TXT STRL SZ7.5 (GLOVE) ×6 IMPLANT
GLOVE SURG ORTHO 8.0 STRL STRW (GLOVE) IMPLANT
GOWN STRL NON-REIN LRG LVL3 (GOWN DISPOSABLE) ×9 IMPLANT
KIT BASIN OR (CUSTOM PROCEDURE TRAY) ×3 IMPLANT
KIT ROOM TURNOVER OR (KITS) ×3 IMPLANT
MANIFOLD NEPTUNE II (INSTRUMENTS) IMPLANT
NS IRRIG 1000ML POUR BTL (IV SOLUTION) ×3 IMPLANT
PACK GENERAL/GYN (CUSTOM PROCEDURE TRAY) IMPLANT
PACK ORTHO EXTREMITY (CUSTOM PROCEDURE TRAY) ×3 IMPLANT
PAD ARMBOARD 7.5X6 YLW CONV (MISCELLANEOUS) ×6 IMPLANT
SPONGE GAUZE 2X2 STER 10/PKG (GAUZE/BANDAGES/DRESSINGS) ×1
STAPLER VISISTAT 35W (STAPLE) ×3 IMPLANT
STOCKINETTE IMPERVIOUS LG (DRAPES) ×3 IMPLANT
SUT VIC AB 0 CT1 27 (SUTURE) ×1
SUT VIC AB 0 CT1 27XBRD ANBCTR (SUTURE) ×2 IMPLANT
SUT VIC AB 2-0 CT1 27 (SUTURE) ×1
SUT VIC AB 2-0 CT1 TAPERPNT 27 (SUTURE) ×2 IMPLANT
TAPE CLOTH SURG 4X10 WHT LF (GAUZE/BANDAGES/DRESSINGS) ×3 IMPLANT
TOWEL OR 17X24 6PK STRL BLUE (TOWEL DISPOSABLE) ×3 IMPLANT
TOWEL OR 17X26 10 PK STRL BLUE (TOWEL DISPOSABLE) ×3 IMPLANT
WATER STERILE IRR 1000ML POUR (IV SOLUTION) IMPLANT

## 2012-09-05 NOTE — ED Notes (Addendum)
Per EMS, Pt slipped and fell on ice this AM.  C/o R hip pain.  No shortening or deformity noted.  Full ROM.  Pt was ambulatory directly after fall.  Pain score 10/10.  Vitals are stable.  Denies numbness and tingling.  Pulses are strong.  Fentanyl given in route.

## 2012-09-05 NOTE — ED Provider Notes (Signed)
History     CSN: [id number]  Arrival date & time 09/05/12  1134   First MD Initiated Contact with Patient 09/05/12 1148      Chief Complaint  Patient presents with  . Fall  . Hip Pain    (Consider location/radiation/quality/duration/timing/severity/associated sxs/prior treatment) HPI Comments: Leslie Cox is a 67 y.o. male with a history of hypertension presents emergency department status post mechanical fall that occurred this morning.  Patient reports slipping with point of impact being the right side of his body.  Fall was from about 3-4 feet onto concrete.  Patient was able to get up with assistance after the fall but has been unable to bear weight on his right lower extremity since.  Pain location is described as being in the right hip region and right groin.  Patient denies numbness or tingling of extremity.  Pain worsened by use of extremity, palpation, and rotation.  Severity rated at 6/10.  Patient denies a history of osteopenia, osteoporosis or arthritis.  Patient denies associated symptoms. Last meal at 8 a.m. this morning consisting of eggs and bacon. No other concerns at this time.  Patient is a 68 y.o. male presenting with fall and hip pain. The history is provided by the patient.  Fall  Hip Pain    Past Medical History  Diagnosis Date  . Hypertension     Past Surgical History  Procedure Laterality Date  . Tonsillectomy      No family history on file.  History  Substance Use Topics  . Smoking status: Never Smoker   . Smokeless tobacco: Never Used  . Alcohol Use: No      Review of Systems  All other systems reviewed and are negative.    Allergies  Review of patient's allergies indicates no known allergies.  Home Medications  No current outpatient prescriptions on file.  BP 188/97  Pulse 110  Temp(Src) 98.3 F (36.8 C) (Oral)  Resp 16  SpO2 96%  Physical Exam  Nursing note and vitals reviewed. Constitutional: He appears well-developed and  well-nourished. No distress.  HENT:  Head: Normocephalic and atraumatic.  Eyes: Conjunctivae and EOM are normal.  Neck: Normal range of motion. Neck supple.  Cardiovascular:  Intact distal pulses, capillary refill < 3 seconds  Musculoskeletal:  Tenderness to palpation of right hip and groin area.  Pain with internal and external rotation of right lower extremity.  Leg length equal bilaterally, no inversion or eversion. All other extremities with normal ROM  Neurological:  No sensory deficit, inability to bear weight due to pain  Skin: He is not diaphoretic.  Skin intact, no tenting    ED Course  Procedures (including critical care time)  Labs Reviewed  CBC  BASIC METABOLIC PANEL   Dg Hip Complete Right  09/05/2012  *RADIOLOGY REPORT*  Clinical Data: Fall, right hip pain  RIGHT HIP - COMPLETE 2+ VIEW  Comparison: None.  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  The right proximal humerus appears intact.  Deformity of the right inferior pubic ramus is favored to be chronic.  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   Original Report Authenticated By: Charline Bills, M.D.      No diagnosis found.  Consult orthopedics: Dr. Charlann Boxer [facility] [facility], reviewed imaging and requests that patient be transferred to [facility] for possible surgery today.  Patient has been kept n.p.o. with last meal at  8 a.m.                               MDM  Right acetabular fracture  Patient is a 67 year old male with a history of hypertension presents emergency department status post mechanical fall.  Point of impact with right hip.  Neurovascularly intact on exam, unable to bear weight  N.p.o. since 8 a.m.  Patient's denies numbness or tingling of extremity.  Imaging and consults as above.  Case discussed with attending who agrees with treatment plan.  He is to be admitted to [facility] for  possible surgery today. The patient appears reasonably stabilized for admission considering the current resources, flow, and capabilities available in the ED at this time, and I doubt any other [facility] requiring further screening and/or treatment in the ED prior to admission.         Jaci Carrel, [place] 09/05/12 1610

## 2012-09-05 NOTE — Anesthesia Procedure Notes (Signed)
Procedure Name: LMA Insertion Date/Time: 09/05/2012 8:10 PM Performed by: Molli Hazard Pre-anesthesia Checklist: Patient identified, Emergency Drugs available, Suction available and Patient being monitored Patient Re-evaluated:Patient Re-evaluated prior to inductionOxygen Delivery Method: Circle system utilized Preoxygenation: Pre-oxygenation with 100% oxygen Intubation Type: IV induction LMA: LMA with gastric port inserted LMA Size: 4.0 Number of attempts: 1 Tube secured with: Tape Dental Injury: Teeth and Oropharynx as per pre-operative assessment

## 2012-09-05 NOTE — H&P (Signed)
Leslie Cox is an 67 y.o. male.    Chief Complaint:   Right hip pain  HPI: Leslie Cox is a 67 y.o. male with a history of hypertension presents emergency department status post mechanical fall that occurred this morning. Patient reports slipping with point of impact being the right side of his body. Fall was from about 3-4 feet onto concrete. Patient was able to get up with assistance after the fall but has been unable to bear weight on his right lower extremity since. Pain location is described as being in the right hip region and right groin. Patient denies numbness or tingling of extremity. Pain worsened by use of extremity, palpation, and rotation. Severity rated at 6/10. Patient denies a history of osteopenia, osteoporosis or arthritis. Patient denies associated symptoms. Last meal at 8 a.m. this morning consisting of eggs and bacon. No other concerns at this time. Ortho consulted and case was discussed. X-rays revealed a right acetabular with protrusio of the femoral head.  Discussion was had that the patient will need surgery. Risks, benefits and expectations were discussed with the patient. It is stressed to the patient regarding that he will be at risk for AVN and OA of the right hip/ Patient understand the risks, benefits and expectations and wishes to proceed with surgery.    PCP:  Rogelia Boga, MD  PMH: Past Medical History  Diagnosis Date  . Hypertension    PSH: Past Surgical History  Procedure Laterality Date  . Tonsillectomy     Social History:  reports that he has never smoked. He has never used smokeless tobacco. He reports that he does not drink alcohol or use illicit drugs.  Allergies:  No Known Allergies  Medications: No current facility-administered medications for this encounter.   No current outpatient prescriptions on file.    Results for orders placed during the hospital encounter of 09/05/12 (from the past 48 hour(s))  CBC     Status: Abnormal   Collection Time    09/05/12  2:45 PM      Result Value Range   WBC 12.2 (*) 4.0 - 10.5 K/uL   RBC 4.74  4.22 - 5.81 MIL/uL   Hemoglobin 14.6  13.0 - 17.0 g/dL   HCT 16.1  09.6 - 04.5 %   MCV 90.5  78.0 - 100.0 fL   MCH 30.8  26.0 - 34.0 pg   MCHC 34.0  30.0 - 36.0 g/dL   RDW 40.9  81.1 - 91.4 %   Platelets 214  150 - 400 K/uL  BASIC METABOLIC PANEL     Status: Abnormal   Collection Time    09/05/12  2:45 PM      Result Value Range   Sodium 139  135 - 145 mEq/L   Potassium 4.1  3.5 - 5.1 mEq/L   Chloride 103  96 - 112 mEq/L   CO2 28  19 - 32 mEq/L   Glucose, Bld 104 (*) 70 - 99 mg/dL   BUN 13  6 - 23 mg/dL   Creatinine, Ser 7.82  0.50 - 1.35 mg/dL   Calcium 8.7  8.4 - 95.6 mg/dL   GFR calc non Af Amer >90  >90 mL/min   GFR calc Af Amer >90  >90 mL/min   Comment:            The eGFR has been calculated     using the CKD EPI equation.     This calculation has not been  validated in all clinical     situations.     eGFR's persistently     <90 mL/min signify     possible Chronic Kidney Disease.   Dg Hip Complete Right  09/05/2012  *RADIOLOGY REPORT*  Clinical Data: Fall, right hip pain  RIGHT HIP - COMPLETE 2+ VIEW  Comparison: None.  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  The right proximal humerus appears intact.  Deformity of the right inferior pubic ramus is favored to be chronic.  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   Original Report Authenticated By: Charline Bills, M.D.     Review of Systems  Constitutional: Negative.   HENT: Negative.   Eyes: Negative.   Respiratory: Negative.   Cardiovascular: Negative.   Gastrointestinal: Negative.   Genitourinary: Negative.   Musculoskeletal: Positive for joint pain.  Skin: Negative.   Neurological: Negative.   Endo/Heme/Allergies: Negative.   Psychiatric/Behavioral: Negative.      Physical Exam    Constitutional: He is oriented to person, place, and time and well-developed, well-nourished, and in no distress.  HENT:  Head: Normocephalic and atraumatic.  Mouth/Throat: Oropharynx is clear and moist.  Eyes: Pupils are equal, round, and reactive to light.  Neck: Neck supple. No JVD present. No tracheal deviation present. No thyromegaly present.  Cardiovascular: Normal rate, regular rhythm, normal heart sounds and intact distal pulses.   Pulmonary/Chest: Effort normal and breath sounds normal. No stridor. No respiratory distress. He has no wheezes.  Abdominal: Soft. There is no tenderness. There is no guarding.  Musculoskeletal:       Right hip: He exhibits decreased range of motion, decreased strength, tenderness, bony tenderness and deformity. He exhibits no swelling and no laceration.  Lymphadenopathy:    He has no cervical adenopathy.  Neurological: He is alert and oriented to person, place, and time.  Skin: Skin is warm and dry.  Psychiatric: Affect normal.     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  Plan for today is a transtibial traction pin to pull inline traction on the right leg. Further diagnostic tests will be ordered to evaluate the acetabular fracture. Plan is for Dr. Carola Frost to address the fracture and for definitive treatment next week. Patient will be at risk for AVN and OA changes in future and this is discussed with the patient.   Anastasio Auerbach Rogan Ecklund   PAC  09/05/2012, 4:34 PM

## 2012-09-05 NOTE — ED Notes (Signed)
ZOX:[id number]<AV> Expected date:09/05/12<BR> Expected time:11:19 AM<BR> Means of arrival:Ambulance<BR> Comments:<BR> Fall/hip pain

## 2012-09-05 NOTE — Anesthesia Preprocedure Evaluation (Addendum)
Anesthesia Evaluation  Patient identified by MRN, date of birth, ID band Patient awake    Reviewed: Allergy & Precautions, H&P , NPO status , Patient's Chart, lab work & pertinent test results  History of Anesthesia Complications Negative for: history of anesthetic complications  Airway Mallampati: II TM Distance: >3 FB Neck ROM: Full    Dental  (+) Dental Advisory Given, Teeth Intact and Caps,    Pulmonary neg pulmonary ROS,  breath sounds clear to auscultation  Pulmonary exam normal       Cardiovascular negative cardio ROS  Rhythm:Regular Rate:Tachycardia  05-Sep-2012 16:39:02 [facility] ED ROUTINE RECORD SINUS TACHYCARDIA ~ V-rate> 99 EARLY PRECORDIAL R/S TRANSITION ~ QRS area positive in V2   Neuro/Psych negative neurological ROS  negative psych ROS   GI/Hepatic negative GI ROS, Neg liver ROS,   Endo/Other  negative endocrine ROS  Renal/GU negative Renal ROS  negative genitourinary   Musculoskeletal negative musculoskeletal ROS (+)   Abdominal   Peds  Hematology negative hematology ROS (+)   Anesthesia Other Findings Several rear crowns/molars  Reproductive/Obstetrics                        Anesthesia Physical Anesthesia Plan  ASA: I and emergent  Anesthesia Plan: General   Post-op Pain Management:    Induction: Intravenous  Airway Management Planned: Oral ETT  Additional Equipment:   Intra-op Plan:   Post-operative Plan: Extubation in OR  Informed Consent: I have reviewed the patients History and Physical, chart, labs and discussed the procedure including the risks, benefits and alternatives for the proposed anesthesia with the patient or authorized representative who has indicated his/her understanding and acceptance.   Dental advisory given  Plan Discussed with: CRNA, Anesthesiologist and Surgeon  Anesthesia Plan Comments: (Plan routine monitors, GETA)        Anesthesia Quick Evaluation

## 2012-09-05 NOTE — Preoperative (Signed)
Beta Blockers   Reason not to administer Beta Blockers:Not Applicable 

## 2012-09-05 NOTE — Anesthesia Postprocedure Evaluation (Signed)
  Anesthesia Post-op Note  Patient: Leslie Cox  Procedure(s) Performed: Procedure(s): Application of traction bow externally (Right)  Patient Location: PACU  Anesthesia Type:General  Level of Consciousness: sedated, patient cooperative and responds to stimulation and voice  Airway and Oxygen Therapy: Patient Spontanous Breathing and Patient connected to nasal cannula oxygen  Post-op Pain: none  Post-op Assessment: Post-op Vital signs reviewed, Patient's Cardiovascular Status Stable, Respiratory Function Stable, Patent Airway, No signs of Nausea or vomiting and Pain level controlled  Post-op Vital Signs: Reviewed and stable  Complications: No apparent anesthesia complications

## 2012-09-05 NOTE — ED Notes (Signed)
Patient transported to X-ray 

## 2012-09-05 NOTE — Transfer of Care (Signed)
Immediate Anesthesia Transfer of Care Note  Patient: Leslie Cox  Procedure(s) Performed: Procedure(s): Application of traction bow externally (Right)  Patient Location: PACU  Anesthesia Type:General  Level of Consciousness: awake, alert  and oriented  Airway & Oxygen Therapy: Patient connected to nasal cannula oxygen  Post-op Assessment: Report given to PACU RN, Post -op Vital signs reviewed and stable and Patient moving all extremities X 4  Post vital signs: Reviewed and stable  Complications: No apparent anesthesia complications

## 2012-09-05 NOTE — Progress Notes (Signed)
Orthopedic Tech Progress Note Patient Details:  Leslie Cox Dec 19, 1945 [id number]  Musculoskeletal Traction Type of Traction: Skeletal (Balanced Suspension) Traction Location: (R) LE Traction Weight: 20 lbs    Jennye Moccasin 09/05/2012, 9:19 PM

## 2012-09-06 ENCOUNTER — Inpatient Hospital Stay (HOSPITAL_COMMUNITY): Payer: BC Managed Care – PPO

## 2012-09-06 ENCOUNTER — Encounter (HOSPITAL_COMMUNITY): Payer: Self-pay | Admitting: Orthopedic Surgery

## 2012-09-06 SURGERY — FIXATION, FEMUR, NECK, PERCUTANEOUS, USING SCREW
Anesthesia: General | Laterality: Right

## 2012-09-06 NOTE — Progress Notes (Signed)
Subjective: Right hip pain   Objective: Vital signs in last 24 hours: Temp:  [97.5 F (36.4 C)-99.1 F (37.3 C)] 98 F (36.7 C) (03/09 0628) Pulse Rate:  [91-118] 91 (03/09 0628) Resp:  [11-18] 16 (03/09 0628) BP: (131-188)/(76-109) 131/79 mmHg (03/09 0628) SpO2:  [96 %-100 %] 100 % (03/09 0628)  Intake/Output from previous day: 03/08 0701 - 03/09 0700 In: 2077.5 [P.O.:240; I.V.:1787.5] Out: 805 [Urine:790; Blood:15] Intake/Output this shift:     Recent Labs  09/05/12 1445  HGB 14.6    Recent Labs  09/05/12 1445  WBC 12.2*  RBC 4.74  HCT 42.9  PLT 214    Recent Labs  09/05/12 1445  NA 139  K 4.1  CL 103  CO2 28  BUN 13  CREATININE 0.64  GLUCOSE 104*  CALCIUM 8.7   No results found for this basename: LABPT, INR,  in the last 72 hours  Neurologically intact No DVT. Pulses intact CT scan results pending Assessment/Plan: Right acetabular fracture. Stable Handy to see tomorrow. CT scan   BEANE,JEFFREY C 09/06/2012, 10:10 AM

## 2012-09-06 NOTE — Brief Op Note (Signed)
09/05/2012  5:55 AM  PATIENT:  Leslie Cox  67 y.o. male  PRE-OPERATIVE DIAGNOSIS:  Right acetabular fracture  POST-OPERATIVE DIAGNOSIS:  Right acetabular fracture, involving the medial wall with degree of acetabular protruiso  PROCEDURE:  Procedure(s): Application of traction bow externally (Right) tibia  SURGEON:  Surgeon(s) and Role:    * Shelda Pal, MD - Primary  PHYSICIAN ASSISTANT: None  ANESTHESIA:   general  EBL:  Total I/O In: 1470 [P.O.:120; I.V.:1300; Other:50] Out: 265 [Urine:250; Blood:15]  BLOOD ADMINISTERED:none  DRAINS: none   LOCAL MEDICATIONS USED:  NONE  SPECIMEN:  No Specimen  DISPOSITION OF SPECIMEN:  N/A  COUNTS:  Percutaneous procedure   TOURNIQUET:  * No tourniquets in log *  DICTATION: .Other Dictation: Dictation Number [id number]  PLAN OF CARE: Admit to inpatient   PATIENT DISPOSITION:  PACU - hemodynamically stable.   Delay start of Pharmacological VTE agent (>24hrs) due to surgical blood loss or risk of bleeding: no

## 2012-09-07 ENCOUNTER — Encounter (HOSPITAL_COMMUNITY): Payer: Self-pay | Admitting: Orthopedic Surgery

## 2012-09-07 ENCOUNTER — Inpatient Hospital Stay (HOSPITAL_COMMUNITY): Payer: BC Managed Care – PPO

## 2012-09-07 DIAGNOSIS — K219 Gastro-esophageal reflux disease without esophagitis: Secondary | ICD-10-CM | POA: Diagnosis present

## 2012-09-07 LAB — CBC
HCT: 30.1 % — ABNORMAL LOW (ref 39.0–52.0)
MCH: 30.7 pg (ref 26.0–34.0)
MCHC: 34.6 g/dL (ref 30.0–36.0)
MCV: 88.8 fL (ref 78.0–100.0)
Platelets: 150 10*3/uL (ref 150–400)
RDW: 12.6 % (ref 11.5–15.5)

## 2012-09-07 LAB — BASIC METABOLIC PANEL
BUN: 7 mg/dL (ref 6–23)
CO2: 29 mEq/L (ref 19–32)
Calcium: 8 mg/dL — ABNORMAL LOW (ref 8.4–10.5)
Creatinine, Ser: 0.55 mg/dL (ref 0.50–1.35)
Glucose, Bld: 164 mg/dL — ABNORMAL HIGH (ref 70–99)

## 2012-09-07 MED ORDER — CEFAZOLIN SODIUM-DEXTROSE 2-3 GM-% IV SOLR
2.0000 g | Freq: Once | INTRAVENOUS | Status: AC
  Administered 2012-09-08 (×2): 2 g via INTRAVENOUS
  Filled 2012-09-07: qty 50

## 2012-09-07 MED ORDER — PANTOPRAZOLE SODIUM 40 MG PO TBEC
40.0000 mg | DELAYED_RELEASE_TABLET | Freq: Every day | ORAL | Status: DC
  Administered 2012-09-07: 40 mg via ORAL
  Filled 2012-09-07: qty 1

## 2012-09-07 NOTE — Progress Notes (Signed)
Orthopedic Tech Progress Note Patient Details:  Leslie Cox 06-Aug-1945 [id number] Trapeze bar patient helper Patient ID: [name], male   DOB: [birth year], 67 y.o.   MRN: [id number]   Nikki Dom 09/07/2012, 3:40 PM

## 2012-09-07 NOTE — Op Note (Signed)
NAMEMarland Cox  CHET, GREENLEY NO.:  [id number]  MEDICAL RECORD NO.:  [id number]  LOCATION:  5N15C                        FACILITY:  [facility]  PHYSICIAN:  Madlyn Frankel. Charlann Boxer, M.D.  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  PROCEDURE:  Placement of a right transtibial pin for application of skeletal traction.  SURGEON:  Madlyn Frankel. Charlann Boxer, M.D.  ASSISTANT:  Surgical team.  ANESTHESIA:  General LMA.  SPECIMENS:  None.  COMPLICATION:  None.  TOURNIQUET:  None used fluoroscopy.  Procedural based on fluoroscopy used for stress radiography, evaluation of pin placement as well as evaluation acetabulum with applied traction.  INDICATIONS FOR PROCEDURE:  Mr. Leslie Cox is a 67 year old male who had a slip on ice, landing on his lateral side of his right hip.  He had immediate onset of pain in the groin area, inability to bear weight.  He was brought to the emergency room where plain films were ordered.  These revealed a acetabular protrusio with medial wall fracture compared to his left hip.  He was initially seen and evaluated by emergency room physician and subsequently an orthopedic consult was made.  After reviewing fracture pattern, I reviewed this with our trauma surgeon Dr. Doreen Beam and his team for definitive repair this week.  Plan was outlined as follows for skeletal traction, advanced imaging of the right hip with angled radiographic hip views, Judet views as well as CT scan of the acetabulum.  Consent was obtained after reviewing the indications of the procedure, minimal risk at this point.  Definitive surgical treatment will be reviewed at a later date.  PROCEDURE  IN DETAIL:  The patient was brought to operative theater. Once adequate anesthesia was established, I did give him a gram of Ancef.  He was positioned supine.  Time-out was performed and identifying the patient, planned procedure, and extremity.  The right lower extremity  basically prepped from just above the knee to the ankle with an impervious stockinette.  Fluoroscopy was draped out as well.  I selected a 5/30 second Steinmann pin.  Fluoroscopy was used to identify the proximal metaphysis of the tibia.  A stab incision was made laterally in the Steinmann pin pressed upon the anterior third of the tibia and then passed with power from lateral to medial and a stab incision made over the skin to allow for penetration medially.  Confirmed orientation of the pin radiographically in AP and lateral planes.  Once this was done, we evaluated the hip fracture with and without traction identifying gentle subluxation of the femoral head from acetabular fracture site reducing the medial protrudes in the segment.  Following this identification with radiographs, we applied the traction bow and padded it appropriately.  He was then subsequently awoken from anesthesia and brought to recovery room in stable condition.  The skeletal traction will be applied for 25 pounds of pressure on the tibial pin.  Postoperative orders  for Judet views of the right hip will be ordered as well as CT scan to better evaluate the acetabulum.  Dr. Myrene Galas will be assuming care on Monday with planned surgical intervention sometime this week.  He will be started on DVT prophylaxis in the interim.     Madlyn Frankel Charlann Boxer, M.D.     MDO/MEDQ  D:  09/06/2012  T:  09/07/2012  Job:  [id number]

## 2012-09-07 NOTE — ED Provider Notes (Signed)
Medical screening examination/treatment/procedure(s) were performed by non-physician practitioner and as supervising physician I was immediately available for consultation/collaboration.   Loren Racer, MD 09/07/12 [phone]

## 2012-09-07 NOTE — Progress Notes (Signed)
Patient ID: Leslie Cox, male   DOB: 11-Jun-1946, 67 y.o.   MRN: [id number] Subjective: 2 Days Post-Op Procedure(s) (LRB): Application of traction bow externally (Right)    Patient reports pain as mild.  Right hip better with traction in place. Comfortable awaiting discussion with Handy  Objective:   VITALS:   Filed Vitals:   09/07/12 0600  BP: 136/69  Pulse: 97  Temp: 97.7 F (36.5 C)  Resp: 18    Neurovascular intact Incisions with minimal dried blood  LABS  Recent Labs  09/05/12 1445 09/07/12 0500  HGB 14.6 10.4*  HCT 42.9 30.1*  WBC 12.2* 6.6  PLT 214 150     Recent Labs  09/05/12 1445 09/07/12 0500  NA 139 136  K 4.1 4.1  BUN 13 7  CREATININE 0.64 0.55  GLUCOSE 104* 164*    No results found for this basename: LABPT, INR,  in the last 72 hours   Assessment/Plan: 2 Days Post-Op Procedure(s) (LRB): Application of traction bow externally (Right)  CT PELVIS WITHOUT CONTRAST  Technique: Multidetector CT imaging of the pelvis was performed  following the standard protocol without intravenous contrast.  Comparison: Plain film right hip 09/06/2012.  Findings: As seen on patient's plain films, there is a comminuted,  complex right acetabular fracture with associated acetabular  protrusio. The fracture is T-shaped extending transversely across  the acetabulum from anterosuperior the posteroinferior. The medial  acetabular wall is displaced into the pelvis approximately 1 cm.  The fracture extends into the right ilium. The patient has a  fracture of the right inferior pubic ramus. There is extensive  hematoma formation association with the patient's fracture. No  other fracture is identified.  IMPRESSION:  Complex right acetabular fracture as described.  Original Report Authenticated By: Holley Dexter, M.D    Plain films and CT scan done. Handy to review and come up with definitive surgical plan Has been on lovenox

## 2012-09-07 NOTE — Consult Note (Signed)
Orthopaedic Trauma Service Consult   Reason for Consult: complex R acetabulum fracture Referring Physician:  Luna Fuse, MD (Ortho)   HPI:    Leslie Cox is a very pleasant 67 year old Caucasian male who sustained a ground-level fall on 09/05/2012. The patient states that he was in the parking lot to his condo complex when he got in his car to run into his condo real quick. Patient was going to the parking lot and was cognizant to avoid the snow however he did not recognize ice on the ground. He subsequently slipped landing on his right hip. Patient did have immediate onset of pain. With help he was able to get up and was able to get back to his condo. The EMS was called and the patient was brought to [facility] for evaluation. Patient was seen in the emergency department where he was found to have a right acetabulum fracture. Dr. Charlann Boxer orthopedics was consulted.  given the complexity of the injury Dr. Charlann Boxer felt that the patient should be seen and evaluated by the orthopedic trauma service and that his injury I need the expertise of a fellowship trained orthopedic traumatologist. Patient was placed into skeletal traction after he was transferred to [place]. CT scan was then obtained afterwards to fully characterize fracture pattern as well. The patient was then transferred to the orthopedic floor for observation and pain control.   currently patient is in 5 N. 15 complains of right hip pain with movement such as coughing. He also is noting some reflux-like symptoms with burning on eating as well as in a recumbent position. Patient denies any numbness or tingling in his lower extremities. He denies any additional injuries elsewhere. Denies hitting head or blacking out during or after the fall. Denies any neck pain. No chest pain, no palpitations, no shortness of breath. No nausea or vomiting. No abnormal pain. No recent illnesses. No headaches or lightheadedness.  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 He lives in a single level condo dwelling. Patient has one step outside to navigate to gain entry to his condo. he does not use any assistive devices. He lives with his wife. Of note his wife does not drive. He is the one in the family that tries. He does have a son who is grown.  Past Medical History  Diagnosis Date  . Hypertension     Past Surgical History  Procedure Laterality Date  . Tonsillectomy      No family history on file.  Social History:  reports that he has never smoked. He has never used smokeless tobacco. He reports that he does not drink alcohol or use illicit drugs.  Allergies: No Known Allergies  Medications:  I have reviewed the patient's current medications. Prior to Admission:  No prescriptions prior to admission   Scheduled: . docusate sodium  100 mg Oral BID  . enoxaparin (LOVENOX) injection  40 mg Subcutaneous Q24H   Pain medications include Dilaudid, Norco and Tylenol all on a prn basis  Results for orders placed during the hospital encounter of 09/05/12 (from the past 48 hour(s))  CBC     Status: Abnormal   Collection Time    09/05/12  2:45 PM      Result Value Range   WBC 12.2 (*) 4.0 - 10.5 K/uL   RBC 4.74  4.22 - 5.81 MIL/uL   Hemoglobin 14.6  13.0 - 17.0 g/dL   HCT 54.0  98.1 -  52.0 %   MCV 90.5  78.0 - 100.0 fL   MCH 30.8  26.0 - 34.0 pg   MCHC 34.0  30.0 - 36.0 g/dL   RDW 81.1  91.4 - 78.2 %   Platelets 214  150 - 400 K/uL  BASIC METABOLIC PANEL     Status: Abnormal   Collection Time    09/05/12  2:45 PM      Result Value Range   Sodium 139  135 - 145 mEq/L   Potassium 4.1  3.5 - 5.1 mEq/L   Chloride 103  96 - 112 mEq/L   CO2 28  19 - 32 mEq/L   Glucose, Bld 104 (*) 70 - 99 mg/dL   BUN 13  6 - 23 mg/dL   Creatinine, Ser 9.56  0.50 - 1.35 mg/dL   Calcium 8.7  8.4 - 21.3 mg/dL   GFR calc non Af Amer >90  >90 mL/min   GFR calc Af Amer >90  >90 mL/min   Comment:            The eGFR has been  calculated     using the CKD EPI equation.     This calculation has not been     validated in all clinical     situations.     eGFR's persistently     <90 mL/min signify     possible Chronic Kidney Disease.  URINALYSIS, MICROSCOPIC ONLY     Status: Abnormal   Collection Time    09/05/12  4:27 PM      Result Value Range   Color, Urine YELLOW  YELLOW   APPearance CLEAR  CLEAR   Specific Gravity, Urine 1.021  1.005 - 1.030   pH 7.0  5.0 - 8.0   Glucose, UA NEGATIVE  NEGATIVE mg/dL   Hgb urine dipstick NEGATIVE  NEGATIVE   Bilirubin Urine NEGATIVE  NEGATIVE   Ketones, ur 15 (*) NEGATIVE mg/dL   Protein, ur NEGATIVE  NEGATIVE mg/dL   Urobilinogen, UA 0.2  0.0 - 1.0 mg/dL   Nitrite NEGATIVE  NEGATIVE   Leukocytes, UA NEGATIVE  NEGATIVE   RBC / HPF 0-2  <3 RBC/hpf   Squamous Epithelial / LPF RARE  RARE  BASIC METABOLIC PANEL     Status: Abnormal   Collection Time    09/07/12  5:00 AM      Result Value Range   Sodium 136  135 - 145 mEq/L   Potassium 4.1  3.5 - 5.1 mEq/L   Chloride 101  96 - 112 mEq/L   CO2 29  19 - 32 mEq/L   Glucose, Bld 164 (*) 70 - 99 mg/dL   BUN 7  6 - 23 mg/dL   Creatinine, Ser 0.86  0.50 - 1.35 mg/dL   Calcium 8.0 (*) 8.4 - 10.5 mg/dL   GFR calc non Af Amer >90  >90 mL/min   GFR calc Af Amer >90  >90 mL/min   Comment:            The eGFR has been calculated     using the CKD EPI equation.     This calculation has not been     validated in all clinical     situations.     eGFR's persistently     <90 mL/min signify     possible Chronic Kidney Disease.  CBC     Status: Abnormal   Collection Time    09/07/12  5:00 AM  Result Value Range   WBC 6.6  4.0 - 10.5 K/uL   RBC 3.39 (*) 4.22 - 5.81 MIL/uL   Hemoglobin 10.4 (*) 13.0 - 17.0 g/dL   HCT 11.9 (*) 14.7 - 82.9 %   MCV 88.8  78.0 - 100.0 fL   MCH 30.7  26.0 - 34.0 pg   MCHC 34.6  30.0 - 36.0 g/dL   RDW 56.2  13.0 - 86.5 %   Platelets 150  150 - 400 K/uL    Dg Hip 1 View Right  09/05/2012   *RADIOLOGY REPORT*  Clinical Data: Traction tool placement; acetabular fracture.  RIGHT HIP - 1 VIEW  Comparison: Right hip radiographs performed earlier today at 01:36 p.m.  Findings: Three fluoroscopic C-arm images are provided from the OR, demonstrating placement of a metallic device overlying the lateral aspect of the right hip joint space.  The patient's acetabular fracture is again noted.  IMPRESSION: Placement of metallic device overlying the lateral aspect of the right hip joint space; acetabular fracture again seen.   Original Report Authenticated By: Tonia Ghent, M.D.    Dg Hip Complete Right  09/06/2012  *RADIOLOGY REPORT*  Clinical Data: Right acetabular fracture with protrusion of the acetabular head.  RIGHT HIP - COMPLETE 2+ VIEW  Comparison: Plain films 09/05/2012.  Findings: Fracture of the medial wall of the right acetabulum with acetabular protrusio is again seen.  Fracture is age indeterminate. The right femoral head remains located.  Mild degenerative change is seen about the left hip.  Soft tissue structures are unremarkable.  IMPRESSION: Acetabular protrusio on the right with an age indeterminate fracture of the medial wall of the right acetabulum.  Unchanged examination.   Original Report Authenticated By: Holley Dexter, M.D.    Dg Hip Complete Right  09/05/2012  *RADIOLOGY REPORT*  Clinical Data: Fall, right hip pain  RIGHT HIP - COMPLETE 2+ VIEW  Comparison: None.  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  The right proximal humerus appears intact.  Deformity of the right inferior pubic ramus is favored to be chronic.  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   Original Report Authenticated By: Charline Bills, M.D.    Dg Knee 1-2 Views Right  09/05/2012  *RADIOLOGY REPORT*  Clinical Data: Traction tool placement at the right tibia and fibula.  RIGHT KNEE - 1-2 VIEW   Comparison: None.  Findings: A single fluoroscopic C-arm image is provided from the OR, demonstrating placement of a traction tool overlying the proximal tibia and fibula.  The knee joint is not well assessed but appears grossly unremarkable.  IMPRESSION: Status post placement of traction tool overlying the proximal tibia and fibula.   Original Report Authenticated By: Tonia Ghent, M.D.    Ct Pelvis Wo Contrast  09/06/2012  *RADIOLOGY REPORT*  Clinical Data: Right acetabular fracture.  CT PELVIS WITHOUT CONTRAST  Technique:  Multidetector CT imaging of the pelvis was performed following the standard protocol without intravenous contrast.  Comparison: Plain film right hip 09/06/2012.  Findings: As seen on patient's plain films, there is a comminuted, complex right acetabular fracture with associated acetabular protrusio. The fracture is T-shaped extending transversely across the acetabulum from anterosuperior the posteroinferior.  The medial acetabular wall is displaced into the pelvis approximately 1 cm. The fracture extends into the right ilium. The patient has a fracture of the right inferior pubic ramus.   There is extensive hematoma  formation association with the patient's fracture.  No other fracture is identified.  IMPRESSION: Complex right acetabular fracture as described.   Original Report Authenticated By: Holley Dexter, M.D.     Review of Systems  Constitutional: Negative for fever and chills.  Eyes: Negative for blurred vision and double vision.  Respiratory: Negative for shortness of breath and wheezing.   Cardiovascular: Negative for chest pain and palpitations.  Gastrointestinal: Positive for heartburn. Negative for nausea, vomiting, abdominal pain and diarrhea.  Genitourinary: Negative for dysuria.  Musculoskeletal:       Right hip pain  Neurological: Negative for dizziness, tingling, sensory change, focal weakness and headaches.   Blood pressure 136/69, pulse 97, temperature 97.7  F (36.5 C), temperature source Oral, resp. rate 13, height 5\' 7"  (1.702 m), weight 68.04 kg (150 lb), SpO2 97.00%. Physical Exam  Constitutional: He is oriented to person, place, and time. Vital signs are normal. He appears well-developed. He is cooperative. No distress.  HENT:  Head: Normocephalic and atraumatic.  Mouth/Throat: Oropharynx is clear and moist and mucous membranes are normal.  Eyes: EOM are normal. Pupils are equal, round, and reactive to light.  Neck: Normal range of motion and full passive range of motion without pain. No spinous process tenderness and no muscular tenderness present. Normal range of motion present.  Cardiovascular:  S1 and S2, regular  Respiratory:  To auscultation bilaterally  GI:  Soft, nondistended, nontender,+ bowel sounds  Musculoskeletal:  Bilateral upper extremities are unremarkable. No acute findings. Motor and sensory functions are grossly intact. Palpable pulse is appreciated. Patient can perform active range of motion of his upper extremity joints.  Pelvis: Stable with lateral compression as well as AP compression.          Skin of the right hemipelvis is stable no lesions or wounds are noted. No significant swelling is appreciated  Right lower extremity     The patient with a traction pin in proximal tibia is attached to a 20 pounds of weight, in line skeletal traction      The patient does have discomfort with even subtle manipulation of his leg and this is referred to his right hip.      Thigh, tibia, ankle are relatively nontender and are without gross findings.       Extremity is warm with palpable dorsalis pedis pulse as well as posterior tibialis pulse.   No significant swelling to the right leg is noted.   EHL, FHL, and 2 tibialis, posterior tibialis, peroneals and gastrocsoleus complex motor function are intact.   Deep peroneal nerve, superficial peroneal nerve and tibial nerve sensory functions are intact lateral femoral cutaneous  nerve sensory functions intact as well     compartments are soft and nontender. No pain with passive stretch.  A left lower extremity unremarkable, no acute findings. Sensory functions are intact palpable dorsalis pedis pulses noted                 Neurological: He is alert and oriented to person, place, and time. No sensory deficit.  Skin: Skin is warm and intact. No abrasion and no ecchymosis noted.  Psychiatric: He has a normal mood and affect. His speech is normal. Cognition and memory are normal.    Assessment/Plan:  67 year old male status post ground level fall  1. Mechanical ground-level fall 2. Complex right both column acetabular fracture. OTA 62-C2  Patient will require surgical intervention for plate osteosynthesis.  We plan on proceeding to the OR tomorrow  The patient will be touchdown weightbearing for 8 weeks after definitive fixation.  Begin therapies after surgery as well.   Given pattern of injury and anticipated approach patient most likely will not need radiation treatment for it for prophylaxis   consider CIR consult after surgery as well suspect patient will return home after surgery. Short-term CIR stay may be of benefit for this patient.   3. Hypertension  Stable continue to monitor 4. Pain  Continue with current regimen will adjust as needed after surgery 5. FEN  Continue regular diet  N.p.o. after midnight  Reflux- will start on protonix daily, pt did have some relief with maalox yesterday 6. DVT/PE prophylaxis   Lovenox, will resume after surgery  Will likely place on 21 days therapy 7. ? Metabolic bone disease  We'll check some basic labs given low energy mechanism include vitamin D levels. 8. Disposition  OR tomorrow  Therapies likely to commence on 09/09/2012  Appreciate the opportunity to consult and will assume primary role on this patient   Leslie Latin, PA-C [facility] [phone] (P) 09/07/2012, 10:57 AM

## 2012-09-08 ENCOUNTER — Encounter (HOSPITAL_COMMUNITY): Payer: Self-pay | Admitting: Certified Registered"

## 2012-09-08 ENCOUNTER — Inpatient Hospital Stay (HOSPITAL_COMMUNITY): Payer: BC Managed Care – PPO

## 2012-09-08 ENCOUNTER — Inpatient Hospital Stay (HOSPITAL_COMMUNITY): Payer: BC Managed Care – PPO | Admitting: Certified Registered"

## 2012-09-08 ENCOUNTER — Encounter (HOSPITAL_COMMUNITY)
Admission: EM | Disposition: A | Discharge status: Rehabilitation Facility | Payer: Self-pay | Source: Home / Self Care | Attending: Orthopedic Surgery

## 2012-09-08 HISTORY — PX: ORIF ACETABULAR FRACTURE: SHX5029

## 2012-09-08 LAB — BASIC METABOLIC PANEL
BUN: 7 mg/dL (ref 6–23)
CO2: 28 mEq/L (ref 19–32)
Calcium: 8.7 mg/dL (ref 8.4–10.5)
Chloride: 99 mEq/L (ref 96–112)
Creatinine, Ser: 0.53 mg/dL (ref 0.50–1.35)

## 2012-09-08 LAB — COMPREHENSIVE METABOLIC PANEL
ALT: 19 U/L (ref 0–53)
AST: 35 U/L (ref 0–37)
Alkaline Phosphatase: 43 U/L (ref 39–117)
Calcium: 8.2 mg/dL — ABNORMAL LOW (ref 8.4–10.5)
GFR calc Af Amer: >90 mL/min (ref >90)
Glucose, Bld: 104 mg/dL — ABNORMAL HIGH (ref 70–99)
Potassium: 5.3 mEq/L — ABNORMAL HIGH (ref 3.5–5.1)
Sodium: 135 mEq/L (ref 135–145)
Total Protein: 5.9 g/dL — ABNORMAL LOW (ref 6.0–8.3)

## 2012-09-08 LAB — CBC
Hemoglobin: 11.6 g/dL — ABNORMAL LOW (ref 13.0–17.0)
MCH: 30.9 pg (ref 26.0–34.0)
MCH: 30.4 pg (ref 26.0–34.0)
MCHC: 35.2 g/dL (ref 30.0–36.0)
MCHC: 34.3 g/dL (ref 30.0–36.0)
Platelets: 186 10*3/uL (ref 150–400)
RDW: 12.8 % (ref 11.5–15.5)

## 2012-09-08 LAB — SURGICAL PCR SCREEN: Staphylococcus aureus: NEGATIVE

## 2012-09-08 LAB — VITAMIN D 25 HYDROXY (VIT D DEFICIENCY, FRACTURES): Vit D, 25-Hydroxy: 26 ng/mL — ABNORMAL LOW (ref 30–89)

## 2012-09-08 LAB — POCT I-STAT 4, (NA,K, GLUC, HGB,HCT)
Hemoglobin: 8.2 g/dL — ABNORMAL LOW (ref 13.0–17.0)
Potassium: 4.3 mEq/L (ref 3.5–5.1)

## 2012-09-08 SURGERY — OPEN REDUCTION INTERNAL FIXATION (ORIF) ACETABULAR FRACTURE
Anesthesia: General | Site: Pelvis | Laterality: Right | Wound class: Clean

## 2012-09-08 MED ORDER — HYDROMORPHONE HCL PF 1 MG/ML IJ SOLN: 0.2500 mg | INTRAMUSCULAR | Status: DC | PRN

## 2012-09-08 MED ORDER — GLYCOPYRROLATE 0.2 MG/ML IJ SOLN
INTRAMUSCULAR | Status: DC | PRN
  Administered 2012-09-08: .4 mg via INTRAVENOUS

## 2012-09-08 MED ORDER — NEOSTIGMINE METHYLSULFATE 1 MG/ML IJ SOLN
INTRAMUSCULAR | Status: DC | PRN
  Administered 2012-09-08: 3 mg via INTRAVENOUS

## 2012-09-08 MED ORDER — ONDANSETRON HCL 4 MG/2ML IJ SOLN
4.0000 mg | Freq: Four times a day (QID) | INTRAMUSCULAR | Status: DC | PRN
  Filled 2012-09-08: qty 2

## 2012-09-08 MED ORDER — 0.9 % SODIUM CHLORIDE (POUR BTL) OPTIME
TOPICAL | Status: DC | PRN
  Administered 2012-09-08 (×2): 1000 mL

## 2012-09-08 MED ORDER — MIDAZOLAM HCL 5 MG/5ML IJ SOLN
INTRAMUSCULAR | Status: DC | PRN
  Administered 2012-09-08: 2 mg via INTRAVENOUS

## 2012-09-08 MED ORDER — HYDRALAZINE HCL 10 MG PO TABS
10.0000 mg | ORAL_TABLET | Freq: Once | ORAL | Status: AC
  Administered 2012-09-08: 10 mg via ORAL
  Filled 2012-09-08: qty 1

## 2012-09-08 MED ORDER — MORPHINE SULFATE (PF) 1 MG/ML IV SOLN
INTRAVENOUS | Status: DC
  Administered 2012-09-09: 19:00:00 via INTRAVENOUS
  Administered 2012-09-09: 13 mg via INTRAVENOUS
  Administered 2012-09-09: 06:00:00 via INTRAVENOUS
  Administered 2012-09-09: 7 mg via INTRAVENOUS
  Administered 2012-09-10: 8 mg via INTRAVENOUS
  Administered 2012-09-10: 4 mg via INTRAVENOUS
  Administered 2012-09-10: 5 mg via INTRAVENOUS
  Filled 2012-09-08 (×2): qty 25

## 2012-09-08 MED ORDER — ONDANSETRON HCL 4 MG/2ML IJ SOLN
INTRAMUSCULAR | Status: DC | PRN
  Administered 2012-09-08: 4 mg via INTRAVENOUS

## 2012-09-08 MED ORDER — SODIUM CHLORIDE 0.9 % IJ SOLN: 9.0000 mL | INTRAMUSCULAR | Status: DC | PRN

## 2012-09-08 MED ORDER — FENTANYL CITRATE 0.05 MG/ML IJ SOLN
INTRAMUSCULAR | Status: DC | PRN
  Administered 2012-09-08 (×5): 50 ug via INTRAVENOUS
  Administered 2012-09-08: 150 ug via INTRAVENOUS
  Administered 2012-09-08 (×2): 50 ug via INTRAVENOUS

## 2012-09-08 MED ORDER — PHENYLEPHRINE HCL 10 MG/ML IJ SOLN
INTRAMUSCULAR | Status: DC | PRN
  Administered 2012-09-08: 80 ug via INTRAVENOUS
  Administered 2012-09-08: 40 ug via INTRAVENOUS
  Administered 2012-09-08 (×5): 80 ug via INTRAVENOUS
  Administered 2012-09-08: 40 ug via INTRAVENOUS
  Administered 2012-09-08: 120 ug via INTRAVENOUS
  Administered 2012-09-08: 80 ug via INTRAVENOUS
  Administered 2012-09-08: 40 ug via INTRAVENOUS

## 2012-09-08 MED ORDER — DIPHENHYDRAMINE HCL 50 MG/ML IJ SOLN: 12.5000 mg | Freq: Four times a day (QID) | INTRAMUSCULAR | Status: DC | PRN

## 2012-09-08 MED ORDER — SODIUM CHLORIDE 0.9 % IV SOLN
INTRAVENOUS | Status: DC | PRN
  Administered 2012-09-08: 19:00:00 via INTRAVENOUS

## 2012-09-08 MED ORDER — PROMETHAZINE HCL 25 MG/ML IJ SOLN
INTRAMUSCULAR | Status: AC
  Administered 2012-09-09: 6.25 mg via INTRAVENOUS
  Filled 2012-09-08: qty 1

## 2012-09-08 MED ORDER — OXYCODONE HCL 5 MG PO TABS: 5.0000 mg | ORAL_TABLET | Freq: Once | ORAL | Status: DC | PRN

## 2012-09-08 MED ORDER — LACTATED RINGERS IV SOLN
INTRAVENOUS | Status: DC
  Administered 2012-09-08 (×4): via INTRAVENOUS

## 2012-09-08 MED ORDER — LIDOCAINE HCL (CARDIAC) 20 MG/ML IV SOLN
INTRAVENOUS | Status: DC | PRN
  Administered 2012-09-08: 80 mg via INTRAVENOUS

## 2012-09-08 MED ORDER — NALOXONE HCL 0.4 MG/ML IJ SOLN: 0.4000 mg | INTRAMUSCULAR | Status: DC | PRN

## 2012-09-08 MED ORDER — MORPHINE SULFATE (PF) 1 MG/ML IV SOLN
INTRAVENOUS | Status: AC
  Filled 2012-09-08: qty 25

## 2012-09-08 MED ORDER — OXYCODONE HCL 5 MG/5ML PO SOLN: 5.0000 mg | Freq: Once | ORAL | Status: DC | PRN

## 2012-09-08 MED ORDER — HYDROMORPHONE HCL PF 1 MG/ML IJ SOLN
INTRAMUSCULAR | Status: AC
  Filled 2012-09-08: qty 1

## 2012-09-08 MED ORDER — PROPOFOL 10 MG/ML IV BOLUS
INTRAVENOUS | Status: DC | PRN
  Administered 2012-09-08: 160 mg via INTRAVENOUS

## 2012-09-08 MED ORDER — ALBUMIN HUMAN 5 % IV SOLN
INTRAVENOUS | Status: DC | PRN
  Administered 2012-09-08: 16:00:00 via INTRAVENOUS

## 2012-09-08 MED ORDER — CEFAZOLIN SODIUM 1-5 GM-% IV SOLN
INTRAVENOUS | Status: AC
  Filled 2012-09-08: qty 100

## 2012-09-08 MED ORDER — DIPHENHYDRAMINE HCL 12.5 MG/5ML PO ELIX
12.5000 mg | ORAL_SOLUTION | Freq: Four times a day (QID) | ORAL | Status: DC | PRN
  Filled 2012-09-08: qty 5

## 2012-09-08 MED ORDER — ROCURONIUM BROMIDE 100 MG/10ML IV SOLN
INTRAVENOUS | Status: DC | PRN
  Administered 2012-09-08: 20 mg via INTRAVENOUS
  Administered 2012-09-08: 50 mg via INTRAVENOUS
  Administered 2012-09-08 (×2): 10 mg via INTRAVENOUS
  Administered 2012-09-08: 20 mg via INTRAVENOUS
  Administered 2012-09-08 (×2): 10 mg via INTRAVENOUS
  Administered 2012-09-08: 20 mg via INTRAVENOUS

## 2012-09-08 SURGICAL SUPPLY — 84 items
APPLIER CLIP 11 MED OPEN (CLIP) ×2
BIT DRILL 2.5X300 (BIT) ×1 IMPLANT
BIT DRILL QC 3.5X195 (BIT) ×2 IMPLANT
BLADE SURG ROTATE 9660 (MISCELLANEOUS) IMPLANT
BRUSH SCRUB DISP (MISCELLANEOUS) ×4 IMPLANT
CLIP APPLIE 11 MED OPEN (CLIP) ×1 IMPLANT
CLOTH BEACON ORANGE TIMEOUT ST (SAFETY) ×2 IMPLANT
COVER SURGICAL LIGHT HANDLE (MISCELLANEOUS) ×4 IMPLANT
DRAIN CHANNEL 10F 3/8 F FF (DRAIN) ×2 IMPLANT
DRAIN CHANNEL 15F RND FF W/TCR (WOUND CARE) IMPLANT
DRAPE C-ARM 42X72 X-RAY (DRAPES) IMPLANT
DRAPE C-ARMOR (DRAPES) ×2 IMPLANT
DRAPE INCISE IOBAN 66X45 STRL (DRAPES) IMPLANT
DRAPE INCISE IOBAN 85X60 (DRAPES) ×4 IMPLANT
DRAPE ORTHO SPLIT 77X108 STRL (DRAPES) ×2
DRAPE SURG ORHT 6 SPLT 77X108 (DRAPES) ×2 IMPLANT
DRAPE U-SHAPE 47X51 STRL (DRAPES) ×2 IMPLANT
DRILL BIT 2.5X300 (BIT) ×1
DRSG ADAPTIC 3X8 NADH LF (GAUZE/BANDAGES/DRESSINGS) ×2 IMPLANT
DRSG MEPILEX BORDER 4X8 (GAUZE/BANDAGES/DRESSINGS) ×4 IMPLANT
DRSG PAD ABDOMINAL 8X10 ST (GAUZE/BANDAGES/DRESSINGS) ×4 IMPLANT
ELECT BLADE 6.5 EXT (BLADE) IMPLANT
ELECT REM PT RETURN 9FT ADLT (ELECTROSURGICAL) ×2
ELECTRODE REM PT RTRN 9FT ADLT (ELECTROSURGICAL) ×1 IMPLANT
EVACUATOR 1/8 PVC DRAIN (DRAIN) IMPLANT
EVACUATOR SILICONE 100CC (DRAIN) ×4 IMPLANT
GAUZE SPONGE 4X4 16PLY XRAY LF (GAUZE/BANDAGES/DRESSINGS) ×2 IMPLANT
GLOVE BIO SURGEON STRL SZ7.5 (GLOVE) ×2 IMPLANT
GLOVE BIO SURGEON STRL SZ8 (GLOVE) ×2 IMPLANT
GLOVE BIOGEL PI IND STRL 7.5 (GLOVE) ×1 IMPLANT
GLOVE BIOGEL PI IND STRL 8 (GLOVE) ×1 IMPLANT
GLOVE BIOGEL PI INDICATOR 7.5 (GLOVE) ×1
GLOVE BIOGEL PI INDICATOR 8 (GLOVE) ×1
GOWN PREVENTION PLUS XLARGE (GOWN DISPOSABLE) ×2 IMPLANT
GOWN PREVENTION PLUS XXLARGE (GOWN DISPOSABLE) ×2 IMPLANT
GOWN STRL NON-REIN LRG LVL3 (GOWN DISPOSABLE) ×4 IMPLANT
HANDPIECE INTERPULSE COAX TIP (DISPOSABLE) ×1
J-PLATE PELVIC 3.5MM 88MM (Plate) ×2 IMPLANT
KIT BASIN OR (CUSTOM PROCEDURE TRAY) ×2 IMPLANT
KIT ROOM TURNOVER OR (KITS) ×2 IMPLANT
LIGHT ORTHO (MISCELLANEOUS) ×2 IMPLANT
LOOP VESSEL MAXI BLUE (MISCELLANEOUS) IMPLANT
MANIFOLD NEPTUNE II (INSTRUMENTS) ×2 IMPLANT
NEEDLE MAYO TROCAR (NEEDLE) ×2 IMPLANT
NS IRRIG 1000ML POUR BTL (IV SOLUTION) ×2 IMPLANT
PACK TOTAL JOINT (CUSTOM PROCEDURE TRAY) ×2 IMPLANT
PAD ARMBOARD 7.5X6 YLW CONV (MISCELLANEOUS) ×4 IMPLANT
PLATE PELVIC 4H 3.5 (Plate) ×2 IMPLANT
RETRIEVER SUT HEWSON (MISCELLANEOUS) ×2 IMPLANT
SCREW 3.5X42MM (Screw) ×2 IMPLANT
SCREW CORTEX 3.5X40MM (Screw) ×2 IMPLANT
SCREW CORTEX 3.5X75MM PELVIC (Screw) ×2 IMPLANT
SCREW CORTEX STAR 3.5X46MM (Screw) ×4 IMPLANT
SCREW PELVIC CORT ST 3.5X55 (Screw) ×3 IMPLANT
SCREW PELVIC CORTEX 12MM (Screw) ×2 IMPLANT
SCREW PELVIC CORTEX 24MM (Screw) ×2 IMPLANT
SCREW PELVIC CORTEX 32MM (Screw) ×2 IMPLANT
SCREW PELVIC CORTEX 40MM (Screw) ×4 IMPLANT
SCREW PELVIC CORTEX 44MM (Screw) ×2 IMPLANT
SCREW PELVIC CORTEX 48MM (Screw) ×2 IMPLANT
SCREW SELF TAP 3.5 55M (Screw) ×3 IMPLANT
SET HNDPC FAN SPRY TIP SCT (DISPOSABLE) ×1 IMPLANT
SPONGE GAUZE 4X4 12PLY (GAUZE/BANDAGES/DRESSINGS) ×2 IMPLANT
SPONGE LAP 18X18 X RAY DECT (DISPOSABLE) ×6 IMPLANT
STAPLER VISISTAT 35W (STAPLE) ×2 IMPLANT
STRIP CLOSURE SKIN 1/2X4 (GAUZE/BANDAGES/DRESSINGS) ×4 IMPLANT
SUCTION FRAZIER TIP 10 FR DISP (SUCTIONS) ×4 IMPLANT
SUT BONE WAX W31G (SUTURE) ×2 IMPLANT
SUT ETHILON 3 0 PS 1 (SUTURE) ×2 IMPLANT
SUT FIBERWIRE #2 38 T-5 BLUE (SUTURE) ×4
SUT VIC AB 0 CT1 27 (SUTURE) ×5
SUT VIC AB 0 CT1 27XBRD ANBCTR (SUTURE) ×5 IMPLANT
SUT VIC AB 1 CT1 18XCR BRD 8 (SUTURE) ×2 IMPLANT
SUT VIC AB 1 CT1 27 (SUTURE) ×3
SUT VIC AB 1 CT1 27XBRD ANBCTR (SUTURE) ×3 IMPLANT
SUT VIC AB 1 CT1 8-18 (SUTURE) ×2
SUT VIC AB 2-0 CT1 27 (SUTURE) ×5
SUT VIC AB 2-0 CT1 TAPERPNT 27 (SUTURE) ×5 IMPLANT
SUTURE FIBERWR #2 38 T-5 BLUE (SUTURE) ×2 IMPLANT
TAPE CLOTH SURG 6X10 WHT LF (GAUZE/BANDAGES/DRESSINGS) ×2 IMPLANT
TOWEL OR 17X24 6PK STRL BLUE (TOWEL DISPOSABLE) ×2 IMPLANT
TOWEL OR 17X26 10 PK STRL BLUE (TOWEL DISPOSABLE) ×4 IMPLANT
TRAY FOLEY CATH 14FR (SET/KITS/TRAYS/PACK) IMPLANT
WATER STERILE IRR 1000ML POUR (IV SOLUTION) ×8 IMPLANT

## 2012-09-08 NOTE — Anesthesia Postprocedure Evaluation (Signed)
  Anesthesia Post-op Note  Patient: Joan Avetisyan  Procedure(s) Performed: Procedure(s): OPEN REDUCTION INTERNAL FIXATION (ORIF) ACETABULAR FRACTURE (Right)  Patient Location: PACU  Anesthesia Type:General  Level of Consciousness: awake  Airway and Oxygen Therapy: Patient Spontanous Breathing and Patient connected to nasal cannula oxygen  Post-op Pain: mild  Post-op Assessment: Post-op Vital signs reviewed, Patient's Cardiovascular Status Stable, Respiratory Function Stable, Patent Airway and No signs of Nausea or vomiting  Post-op Vital Signs: Reviewed and stable  Complications: No apparent anesthesia complications

## 2012-09-08 NOTE — Anesthesia Preprocedure Evaluation (Addendum)
Anesthesia Evaluation  Patient identified by MRN, date of birth, ID band Patient awake    Reviewed: Allergy & Precautions  Airway Mallampati: II      Dental   Pulmonary neg pulmonary ROS,          Cardiovascular METS: hypertension, Pt. on medications     Neuro/Psych    GI/Hepatic Neg liver ROS, GERD-  ,  Endo/Other  negative endocrine ROS  Renal/GU negative Renal ROS     Musculoskeletal   Abdominal   Peds  Hematology   Anesthesia Other Findings   Reproductive/Obstetrics                          Anesthesia Physical Anesthesia Plan  ASA: III  Anesthesia Plan: General   Post-op Pain Management:    Induction: Intravenous  Airway Management Planned: Oral ETT  Additional Equipment:   Intra-op Plan:   Post-operative Plan: Extubation in OR  Informed Consent: I have reviewed the patients History and Physical, chart, labs and discussed the procedure including the risks, benefits and alternatives for the proposed anesthesia with the patient or authorized representative who has indicated his/her understanding and acceptance.     Plan Discussed with: CRNA, Anesthesiologist and Surgeon  Anesthesia Plan Comments:         Anesthesia Quick Evaluation

## 2012-09-08 NOTE — Anesthesia Procedure Notes (Signed)
Procedure Name: Intubation Date/Time: 09/08/2012 3:19 PM Performed by: Arlice Colt B Pre-anesthesia Checklist: Patient identified, Emergency Drugs available, Suction available, Patient being monitored and Timeout performed Patient Re-evaluated:Patient Re-evaluated prior to inductionOxygen Delivery Method: Circle system utilized Preoxygenation: Pre-oxygenation with 100% oxygen Intubation Type: IV induction Ventilation: Mask ventilation without difficulty Laryngoscope Size: Mac and 3 Grade View: Grade II Tube type: Oral Tube size: 7.5 mm Number of attempts: 1 Airway Equipment and Method: Stylet Placement Confirmation: ETT inserted through vocal cords under direct vision,  positive ETCO2 and breath sounds checked- equal and bilateral Secured at: 23 cm Tube secured with: Tape Dental Injury: Teeth and Oropharynx as per pre-operative assessment

## 2012-09-08 NOTE — Transfer of Care (Signed)
Immediate Anesthesia Transfer of Care Note  Patient: Leslie Cox  Procedure(s) Performed: Procedure(s): OPEN REDUCTION INTERNAL FIXATION (ORIF) ACETABULAR FRACTURE (Right)  Patient Location: PACU  Anesthesia Type:General  Level of Consciousness: awake and patient cooperative  Airway & Oxygen Therapy: Patient Spontanous Breathing and Patient connected to face mask oxygen  Post-op Assessment: Report given to PACU RN and Post -op Vital signs reviewed and stable  Post vital signs: Reviewed and stable  Complications: No apparent anesthesia complications

## 2012-09-08 NOTE — Brief Op Note (Signed)
09/05/2012 - 09/08/2012  9:31 PM  PATIENT:  Leslie Cox  68 y.o. male  PRE-OPERATIVE DIAGNOSIS:  RIGHT ACETABULUM FRACTURE anterior column and posterior hemitransverse  POST-OPERATIVE DIAGNOSIS:  Right acetbular fracture anterior column and posterior hemitransverse   PROCEDURE:  Procedure(s): OPEN REDUCTION INTERNAL FIXATION (ORIF) ACETABULAR FRACTURE (Right) involving both columns for anterior column and posterior hemitransverse pattern 2. Removal of tibial traction pin  SURGEON:  Surgeon(s) and Role:    * Budd Palmer, MD - Primary  PHYSICIAN ASSISTANT: Montez Morita, [facility]  ANESTHESIA:   general  EBL:  Total I/O In: 700 [I.V.:350; Blood:350] Out: 655 [Urine:155; Blood:500]  DRAINS: (1) Jackson-Pratt drain(s) with closed bulb suction in the space of Retzius   LOCAL MEDICATIONS USED:  NONE  SPECIMEN:  No Specimen  DISPOSITION OF SPECIMEN:  N/A  COUNTS:  YES  TOURNIQUET:  * No tourniquets in log *  DICTATION: .Other Dictation: Dictation Number [id number]  PLAN OF CARE: Admit to inpatient   PATIENT DISPOSITION:  PACU - hemodynamically stable.   Delay start of Pharmacological VTE agent (>24hrs) due to surgical blood loss or risk of bleeding: no

## 2012-09-09 LAB — BASIC METABOLIC PANEL
Chloride: 101 mEq/L (ref 96–112)
GFR calc Af Amer: >90 mL/min (ref >90)
GFR calc non Af Amer: >90 mL/min (ref >90)
Potassium: 4.3 mEq/L (ref 3.5–5.1)
Sodium: 135 mEq/L (ref 135–145)

## 2012-09-09 LAB — TYPE AND SCREEN
ABO/RH(D): O POS
Antibody Screen: NEGATIVE
Unit division: 0

## 2012-09-09 LAB — MRSA PCR SCREENING: MRSA by PCR: NEGATIVE

## 2012-09-09 LAB — PROTIME-INR
INR: 1.28 (ref 0.00–1.49)
Prothrombin Time: 15.7 seconds — ABNORMAL HIGH (ref 11.6–15.2)

## 2012-09-09 LAB — CBC
HCT: 27.9 % — ABNORMAL LOW (ref 39.0–52.0)
MCHC: 34.8 g/dL (ref 30.0–36.0)
Platelets: 183 10*3/uL (ref 150–400)
RDW: 13.3 % (ref 11.5–15.5)
WBC: 7.8 10*3/uL (ref 4.0–10.5)

## 2012-09-09 LAB — GLUCOSE, CAPILLARY

## 2012-09-09 MED ORDER — ACETAMINOPHEN 10 MG/ML IV SOLN
1000.0000 mg | Freq: Four times a day (QID) | INTRAVENOUS | Status: AC
  Administered 2012-09-09 – 2012-09-10 (×4): 1000 mg via INTRAVENOUS
  Filled 2012-09-09 (×6): qty 100

## 2012-09-09 MED ORDER — PROMETHAZINE HCL 25 MG/ML IJ SOLN
6.2500 mg | Freq: Once | INTRAMUSCULAR | Status: AC
  Filled 2012-09-09: qty 1

## 2012-09-09 MED ORDER — POLYETHYLENE GLYCOL 3350 17 G PO PACK
17.0000 g | PACK | Freq: Every day | ORAL | Status: DC
  Administered 2012-09-09 – 2012-09-14 (×4): 17 g via ORAL
  Filled 2012-09-09 (×6): qty 1

## 2012-09-09 MED ORDER — WARFARIN - PHARMACIST DOSING INPATIENT: Freq: Every day | Status: DC

## 2012-09-09 MED ORDER — WARFARIN VIDEO
Freq: Once | Status: AC
  Administered 2012-09-09: 14:00:00

## 2012-09-09 MED ORDER — ALUM & MAG HYDROXIDE-SIMETH 200-200-20 MG/5ML PO SUSP
30.0000 mL | ORAL | Status: DC | PRN
  Administered 2012-09-10 (×2): 30 mL via ORAL
  Filled 2012-09-09 (×2): qty 30

## 2012-09-09 MED ORDER — METOCLOPRAMIDE HCL 5 MG/ML IJ SOLN
10.0000 mg | Freq: Two times a day (BID) | INTRAMUSCULAR | Status: DC
  Administered 2012-09-09 – 2012-09-11 (×5): 10 mg via INTRAVENOUS
  Filled 2012-09-09 (×6): qty 2

## 2012-09-09 MED ORDER — WARFARIN SODIUM 7.5 MG PO TABS
7.5000 mg | ORAL_TABLET | Freq: Once | ORAL | Status: AC
  Administered 2012-09-09: 7.5 mg via ORAL
  Filled 2012-09-09: qty 1

## 2012-09-09 MED ORDER — ONDANSETRON HCL 4 MG PO TABS: 4.0000 mg | ORAL_TABLET | Freq: Four times a day (QID) | ORAL | Status: DC | PRN

## 2012-09-09 MED ORDER — SORBITOL 70 % SOLN
30.0000 mL | Freq: Every day | Status: DC | PRN
  Administered 2012-09-10: 30 mL via ORAL
  Filled 2012-09-09 (×2): qty 30

## 2012-09-09 MED ORDER — COUMADIN BOOK
Freq: Once | Status: AC
  Administered 2012-09-09: 05:00:00
  Filled 2012-09-09: qty 1

## 2012-09-09 MED ORDER — ENOXAPARIN SODIUM 40 MG/0.4ML ~~LOC~~ SOLN
40.0000 mg | Freq: Every day | SUBCUTANEOUS | Status: DC
  Administered 2012-09-09 – 2012-09-10 (×2): 40 mg via SUBCUTANEOUS
  Filled 2012-09-09 (×3): qty 0.4

## 2012-09-09 MED ORDER — OXYCODONE HCL 5 MG PO TABS
5.0000 mg | ORAL_TABLET | ORAL | Status: DC | PRN
  Administered 2012-09-09 (×2): 10 mg via ORAL
  Filled 2012-09-09 (×2): qty 2

## 2012-09-09 MED ORDER — ONDANSETRON HCL 4 MG/2ML IJ SOLN
4.0000 mg | Freq: Four times a day (QID) | INTRAMUSCULAR | Status: DC | PRN
  Administered 2012-09-09 – 2012-09-10 (×3): 4 mg via INTRAVENOUS
  Filled 2012-09-09 (×2): qty 2

## 2012-09-09 MED ORDER — BIOTENE DRY MOUTH MT LIQD
15.0000 mL | Freq: Two times a day (BID) | OROMUCOSAL | Status: DC
  Administered 2012-09-09 – 2012-09-14 (×11): 15 mL via OROMUCOSAL

## 2012-09-09 MED ORDER — FERROUS SULFATE 325 (65 FE) MG PO TABS
325.0000 mg | ORAL_TABLET | Freq: Three times a day (TID) | ORAL | Status: DC
  Administered 2012-09-09 – 2012-09-11 (×7): 325 mg via ORAL
  Filled 2012-09-09 (×13): qty 1

## 2012-09-09 MED ORDER — CEFAZOLIN SODIUM 1-5 GM-% IV SOLN
1.0000 g | Freq: Three times a day (TID) | INTRAVENOUS | Status: AC
  Administered 2012-09-09 (×3): 1 g via INTRAVENOUS
  Filled 2012-09-09 (×4): qty 50

## 2012-09-09 MED ORDER — METHOCARBAMOL 500 MG PO TABS
1000.0000 mg | ORAL_TABLET | Freq: Four times a day (QID) | ORAL | Status: DC
  Administered 2012-09-09 – 2012-09-14 (×14): 1000 mg via ORAL
  Filled 2012-09-09 (×31): qty 2

## 2012-09-09 MED ORDER — MAGNESIUM CITRATE PO SOLN
1.0000 | Freq: Once | ORAL | Status: AC | PRN
  Filled 2012-09-09: qty 296

## 2012-09-09 MED ORDER — DOCUSATE SODIUM 100 MG PO CAPS
100.0000 mg | ORAL_CAPSULE | Freq: Two times a day (BID) | ORAL | Status: DC
  Administered 2012-09-09 – 2012-09-11 (×5): 100 mg via ORAL
  Filled 2012-09-09 (×9): qty 1

## 2012-09-09 MED ORDER — DEXTROSE 5 % IV SOLN
1000.0000 mg | Freq: Four times a day (QID) | INTRAVENOUS | Status: DC
  Administered 2012-09-09 – 2012-09-12 (×9): 1000 mg via INTRAVENOUS
  Filled 2012-09-09 (×9): qty 10

## 2012-09-09 MED ORDER — POTASSIUM CHLORIDE IN NACL 20-0.9 MEQ/L-% IV SOLN
INTRAVENOUS | Status: DC
  Administered 2012-09-09 – 2012-09-10 (×4): via INTRAVENOUS
  Administered 2012-09-10: 50 mL/h via INTRAVENOUS
  Administered 2012-09-11: 06:00:00 via INTRAVENOUS
  Administered 2012-09-11 – 2012-09-12 (×2): 125 mL/h via INTRAVENOUS
  Administered 2012-09-12 – 2012-09-13 (×2): via INTRAVENOUS
  Filled 2012-09-09 (×16): qty 1000

## 2012-09-09 NOTE — Evaluation (Signed)
Physical Therapy Evaluation Patient Details Name: Leslie Cox MRN: [id number] DOB: 07-07-1945 Today's Date: 09/09/2012 Time: 1478-2956 PT Time Calculation (min): 30 min  PT Assessment / Plan / Recommendation Clinical Impression  Patient is a 67 y/o male admitted s/p fall with right acetabular fracture and ORIF.  He presents with decreased AROM and strength right LE with limited weight bearing and acute pain.  Will benefit from skilled PT in the acute setting to maximize independence and allow d/c home after CIR stay.    PT Assessment  Patient needs continued PT services    Follow Up Recommendations  CIR    Does the patient have the potential to tolerate intense rehabilitation    Yes  Barriers to Discharge None      Equipment Recommendations  Rolling walker with 5" wheels    Recommendations for Other Services   None  Frequency Min 5X/week    Precautions / Restrictions Precautions Precautions: Fall Restrictions Weight Bearing Restrictions: Yes RLE Weight Bearing: Touchdown weight bearing   Pertinent Vitals/Pain Moderate c/o pain right hip with movement, severe with coughing      Mobility  Bed Mobility Bed Mobility: Supine to Sit;Sit to Supine Supine to Sit: 1: +1 Total assist;Other (comment) (with trapeze) Sit to Supine: 1: +2 Total assist (with trapeze) Sit to Supine: Patient Percentage: 30% Details for Bed Mobility Assistance: assist, slow movement and needs max instructional cues using left leg and trapeze to move to edge of bed Transfers Transfers: Not assessed    Exercises General Exercises - Lower Extremity Ankle Circles/Pumps: AROM;Both;10 reps;Supine Quad Sets: AROM;Right;5 reps;Supine Gluteal Sets: AROM;Both;5 reps;Supine   PT Diagnosis: Difficulty walking;Acute pain  PT Problem List: Decreased range of motion;Decreased strength;Decreased activity tolerance;Decreased balance;Decreased mobility;Pain;Decreased knowledge of use of DME;Decreased knowledge of  precautions PT Treatment Interventions: DME instruction;Gait training;Functional mobility training;Therapeutic activities;Therapeutic exercise;Patient/family education;Balance training   PT Goals Acute Rehab PT Goals PT Goal Formulation: With patient Time For Goal Achievement: 09/23/12 Potential to Achieve Goals: Good Pt will go Supine/Side to Sit: with min assist PT Goal: Supine/Side to Sit - Progress: Goal set today Pt will Sit at Edge of Bed: with modified independence;with unilateral upper extremity support;6-10 min PT Goal: Sit at Edge Of Bed - Progress: Goal set today Pt will go Sit to Supine/Side: with min assist PT Goal: Sit to Supine/Side - Progress: Goal set today Pt will go Sit to Stand: with supervision PT Goal: Sit to Stand - Progress: Goal set today Pt will go Stand to Sit: with supervision PT Goal: Stand to Sit - Progress: Goal set today Pt will Transfer Bed to Chair/Chair to Bed: with supervision PT Transfer Goal: Bed to Chair/Chair to Bed - Progress: Goal set today Pt will Ambulate: 51 - 150 feet;with supervision;with rolling walker PT Goal: Ambulate - Progress: Goal set today Pt will Perform Home Exercise Program: with supervision, verbal cues required/provided PT Goal: Perform Home Exercise Program - Progress: Goal set today  Visit Information  Last PT Received On: 09/09/12 Assistance Needed: +2    Subjective Data  Subjective: I hurt when I cough Patient Stated Goal: To return home   Prior Functioning  Home Living Lives With: Spouse Available Help at Discharge: Available 24 hours/day Type of Home: House Home Access: Stairs to enter Secretary/administrator of Steps: 1 Entrance Stairs-Rails: None Home Layout: One level Bathroom Shower/Tub: Teacher, adult education: None Prior Function Level of Independence: Independent Able to Take Stairs?: Yes Driving: Yes Vocation: Full time 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, advertising Overall Cognitive Status: Impaired Area of Impairment: Following commands Arousal/Alertness: Awake/alert Orientation Level: Appears intact for tasks assessed Behavior During Session: [facility] for tasks performed Following Commands: Follows multi-step commands with increased time Cognition - Other Comments: Seems impaired due to meds    Extremity/Trunk Assessment Right Upper Extremity Assessment RUE ROM/Strength/Tone: Within functional levels Left Upper Extremity Assessment LUE ROM/Strength/Tone: Within functional levels Right Lower Extremity Assessment RLE ROM/Strength/Tone: Deficits;Due to pain RLE ROM/Strength/Tone Deficits: ankle WFL AROM, during mobility hip flexion to about 75 degrees sitting; knee flexion to about 80 degrees Left Lower Extremity Assessment LLE ROM/Strength/Tone: WFL for tasks assessed   Balance Balance Balance Assessed: Yes Static Sitting Balance Static Sitting - Balance Support: Bilateral upper extremity supported;Feet unsupported Static Sitting - Level of Assistance: 3: Mod assist;4: Min assist Static Sitting - Comment/# of Minutes: sat edge of bed 7 minutes initially with mod assist, then with bilat UE assist with minguard assist  End of Session PT - End of Session Activity Tolerance: Patient limited by pain Patient left: in bed;with call bell/phone within reach;with family/visitor present;with nursing in room  GP     [facility] 09/09/2012, 5:22 PM [place], [place] [phone] 09/09/2012

## 2012-09-09 NOTE — Progress Notes (Signed)
Pt transferred to 5 north . Report given to carrie rn,

## 2012-09-09 NOTE — Op Note (Signed)
NAMEMarland Cox  DEWAIN, PLATZ NO.:  [id number]  MEDICAL RECORD NO.:  [id number]  LOCATION:  2604                         FACILITY:  [facility]  PHYSICIAN:  Doralee Albino. Carola Frost, M.D. DATE OF BIRTH:  October 07, 1945  DATE OF PROCEDURE:  09/08/2012 DATE OF DISCHARGE:                              OPERATIVE REPORT   PREOPERATIVE DIAGNOSIS:  Right acetabular fracture, anterior column and posterior column hemitransverse.  POSTOPERATIVE DIAGNOSIS:  Right acetabular fracture, anterior column and posterior column hemitransverse.  PROCEDURE:  Operative repair of the right acetabular fracture involving both columns in a anterior column and posterior hemitransverse pattern using the Stoppa approach with the superior window of the ilioinguinal approach.  SURGEON:  Doralee Albino. Carola Frost, M.D.  ASSISTANT:  Mearl Latin, PA-C.  ANESTHESIA:  General.  COMPLICATIONS:  None.  I/O:  IV fluids 350 mL of crystalloid, blood 350 mL out; urine 155 mL and blood 500 mL.  DRAINS:  A Jackson-Pratt in the deep space of Retzius.  SPECIMENS:  None.  DISPOSITION:  To PACU.  CONDITION:  Stable.  BRIEF SUMMARY AND INDICATION OF PROCEDURE:  Leslie Cox is a very pleasant 67 year old male, who sustained a right acetabular fracture.  I did discuss with the patient the risks and benefits of surgical repair including possibility of urologic injury, nerve injury, vessel injury, blood loss requiring transfusions, DVT, PE, heart attack, stroke, hip instability, arthritis, and need for further surgery among others.  The patient understood these risks and did wish to proceed.  BRIEF DESCRIPTION OF PROCEDURE:  Leslie Cox received 2 g of Ancef, was taken to operating room where general anesthesia was induced.  His abdomen was prepped and draped in usual sterile fashion with his right hip flexed to 45 degrees and the tibial traction pin left in for traction during the procedure to facilitate reduction.  The  superior window of the ilioinguinal incision was then made carrying dissection down to the iliac brim through the a muscular plane elevating over to the inner table, which was then packed with a sponge.  Attention was then turned to the abdomen where the Pfannenstiel incision was made. Dissection was carried down to the rectus insertion in pyramidalis, which were divided.  The right spermatic cord was retracted to the lateral side, and was protected throughout the case.  Dissection then continued deep into the pelvis where the malleable retractor was used to protect the bladder at all times.  The obturator nerve was identified and I carefully protected and retracted during the continuation of the procedure.  The displaced anterior column fracture was identified as well as the extension posteriorly down to the quadrilateral plate to the posterior column.  The fracture hematoma was cleaned out of the fracture site using a curved curette and lavage.  At times, I did use pulsatile lavage as well, but when doing so I did protect the obturator nerve. Following this, with the help of my assistant, who pulled longitudinal traction through the traction pin and bow.  Then, able to mobilize the anterior column into a reduced position, which was pinned provisionally. The bone stock was sufficient that we were unable to maintain the reduction with the  pin and then this was exchanged for a lag screw and here again the screw obtained excellent purchase, but the head with sequential tightening to maintain the reduction would tend to mobilize through the inner table.  Consequently, we needed to use a plate as essentially a washer to obtain and maintain the reduction affectively. This did make it more difficult as we were unable to again established a provisional reduction.  The long J-plate from [employer] using the low contour system was then placed along the anterior brim and up the iliac wing where it was  visualized in the superior window.  We then added the plate along the inner surface through the Stoppa window in order to not only counteract the upward force that was done with the brim plate, but also the protrusio displacement with this plate from inside the pelvis. We did not use a quadrilateral plate as we were unable to obtain a provisional reduction.  This resulted in excellent appearance of the hip joint on AP today and inlet and outlet films.  I did adjust the length of some screws, which were found to be slightly long as the plate was sequentially tightened down and final images did show all screws out of the joint and of appropriate length.  We had a choice between the screw being slightly too long or slightly too short because of the 5 mm increments, we did need to go slightly long so that we could gain purchase in the far cortex given the softness of the bone between the tables.  All wounds were irrigated thoroughly.  The patient did receive 2 units of blood as noted above.  Montez Morita, PA-C did assist me throughout and was absolutely necessary for the completion of the case as he did need to protect the bladder, protect the obturator nerve, again exposure to assist with placement of provisional fixation, and also helped to obtain and maintain reduction.  He did assist me with simultaneous wound closure as well.  I did perform anterior portion with placing a deep drain into the space of Retzius and reapproximated with #1 Vicryl figure-of-eight fashion in the rectus and then 0 Vicryl, 2-0 Vicryl, and nylon was used for the skin.  Sterile and gently compressive dressings were applied.  The patient's tibial traction pin was removed and a fresh dressing was applied.  The patient was then taken to PACU in stable condition.  PROGNOSIS:  Leslie Cox will have a pharmacologic DVT prophylaxis.  He will have touched touchdown weightbearing precautions for the next 8 weeks with  graduated weightbearing for the ensuing 4 weeks.  He will not have any formal range of motion restrictions, but will be encouraged to maintain hip precautions to limit stress on his repair and is at increased risk for arthritis and thromboembolic complications.  Repeat examination will be performed regarding his neurologic function.     Doralee Albino. Carola Frost, M.D.     [facility]/MED[facility]  D:  09/08/2012  T:  09/09/2012  Job:  [id number]

## 2012-09-09 NOTE — Progress Notes (Signed)
Physical medicine and rehabilitation consult requested. Patient's status post right acetabular fracture with ORIF. Will await formal physical and occupational therapy evaluation and followup with formal rehabilitation consult

## 2012-09-09 NOTE — Care Management Note (Signed)
  Page 1 of 1   09/09/2012     2:40:49 PM   CARE MANAGEMENT NOTE 09/09/2012  Patient:  Leslie Cox, Leslie Cox   Account Number:  [id number]  Date Initiated:  09/09/2012  Documentation initiated by:  Alvira Philips Assessment:   67 yr-old male adm with (R) acetabular fx s/p operative repair; lives with spouse, independent PTA     DC Planning Services  CM consult      Comments:  09/09/12 1425 Elba Dendinger RN BSN MSN CCM Pt very active, employed.  Discussed rehab options with pt and spouse: SNF vs CIR vs HH therapies.  Await evals.

## 2012-09-09 NOTE — Progress Notes (Signed)
ANTICOAGULATION CONSULT NOTE - Initial Consult  Pharmacy Consult for Coumadin Indication: VTE prophylaxis   No Known Allergies  Patient Measurements: Height: 5\' 7"  (170.2 cm) Weight: 182 lb 12.2 oz (82.9 kg) IBW/kg (Calculated) : 66.1  Vital Signs: Temp: 98.4 F (36.9 C) (03/12 0100) Temp src: Oral (03/12 0100) BP: 124/97 mmHg (03/12 0200) Pulse Rate: 115 (03/12 0200)  Labs:  Recent Labs  09/07/12 0500 09/08/12 0620 09/08/12 1050 09/08/12 1827 09/08/12 2231  HGB 10.4* 10.6*  --  8.2* 11.6*  HCT 30.1* 30.1*  --  24.0* 33.8*  PLT 150 173  --   --  186  CREATININE 0.55 0.52 0.53  --   --     Estimated Creatinine Clearance: 96.1 ml/min (by C-G formula based on Cr of 0.53).   Medical History: Past Medical History  Diagnosis Date  . Hypertension     Medications:  No prescriptions prior to admission    Assessment: 67 yo male s/p hip fracture/repair for DVT prophylaxios with Coumadin  Goal of Therapy:  INR 2-3 Monitor platelets by anticoagulation protocol: Yes   Plan:  Coumadin 7.5 mg po today  Daily INR  Abbott, Gary Fleet 09/09/2012,3:37 AM

## 2012-09-10 DIAGNOSIS — W1789XA Other fall from one level to another, initial encounter: Secondary | ICD-10-CM

## 2012-09-10 DIAGNOSIS — S32409A Unspecified fracture of unspecified acetabulum, initial encounter for closed fracture: Secondary | ICD-10-CM

## 2012-09-10 LAB — BASIC METABOLIC PANEL
CO2: 25 mEq/L (ref 19–32)
Chloride: 101 mEq/L (ref 96–112)
GFR calc Af Amer: >90 mL/min (ref >90)
Potassium: 4.5 mEq/L (ref 3.5–5.1)

## 2012-09-10 LAB — CBC
HCT: 26.4 % — ABNORMAL LOW (ref 39.0–52.0)
Platelets: 198 10*3/uL (ref 150–400)
RBC: 2.99 MIL/uL — ABNORMAL LOW (ref 4.22–5.81)
RDW: 13.4 % (ref 11.5–15.5)
WBC: 7.8 10*3/uL (ref 4.0–10.5)

## 2012-09-10 LAB — PROTIME-INR: Prothrombin Time: 19 seconds — ABNORMAL HIGH (ref 11.6–15.2)

## 2012-09-10 MED ORDER — WARFARIN SODIUM 5 MG IV SOLR
3.0000 mg | Freq: Once | INTRAVENOUS | Status: AC
  Administered 2012-09-10: 3 mg via INTRAVENOUS
  Filled 2012-09-10: qty 1.5

## 2012-09-10 MED ORDER — MORPHINE SULFATE 2 MG/ML IJ SOLN
1.0000 mg | INTRAMUSCULAR | Status: DC | PRN
  Administered 2012-09-10: 2 mg via INTRAVENOUS
  Administered 2012-09-10 – 2012-09-11 (×2): 1 mg via INTRAVENOUS
  Administered 2012-09-11 – 2012-09-12 (×4): 2 mg via INTRAVENOUS
  Filled 2012-09-10 (×9): qty 1

## 2012-09-10 MED ORDER — PANTOPRAZOLE SODIUM 40 MG IV SOLR
40.0000 mg | Freq: Once | INTRAVENOUS | Status: AC
  Administered 2012-09-11: 40 mg via INTRAVENOUS
  Filled 2012-09-10: qty 40

## 2012-09-10 MED ORDER — PANTOPRAZOLE SODIUM 40 MG PO TBEC
40.0000 mg | DELAYED_RELEASE_TABLET | Freq: Every day | ORAL | Status: DC
  Administered 2012-09-10 – 2012-09-11 (×2): 40 mg via ORAL
  Filled 2012-09-10: qty 1

## 2012-09-10 MED ORDER — WARFARIN SODIUM 3 MG PO TABS
3.0000 mg | ORAL_TABLET | Freq: Once | ORAL | Status: DC
  Filled 2012-09-10: qty 1

## 2012-09-10 MED ORDER — HYDROCODONE-ACETAMINOPHEN 5-325 MG PO TABS
1.0000 | ORAL_TABLET | Freq: Four times a day (QID) | ORAL | Status: DC | PRN
  Administered 2012-09-10: 2 via ORAL
  Filled 2012-09-10: qty 2

## 2012-09-10 MED ORDER — BISACODYL 10 MG RE SUPP
10.0000 mg | Freq: Every day | RECTAL | Status: DC | PRN
  Administered 2012-09-11: 10 mg via RECTAL
  Filled 2012-09-10: qty 1

## 2012-09-10 MED ORDER — ACETAMINOPHEN 325 MG PO TABS: 325.0000 mg | ORAL_TABLET | Freq: Four times a day (QID) | ORAL | Status: DC | PRN

## 2012-09-10 MED ORDER — TRAMADOL HCL 50 MG PO TABS: 50.0000 mg | ORAL_TABLET | Freq: Three times a day (TID) | ORAL | Status: DC | PRN

## 2012-09-10 NOTE — Progress Notes (Signed)
ANTICOAGULATION CONSULT NOTE - Initial Consult  Pharmacy Consult for Coumadin Indication: VTE prophylaxis   No Known Allergies  Patient Measurements: Height: 5\' 7"  (170.2 cm) Weight: 182 lb 12.2 oz (82.9 kg) IBW/kg (Calculated) : 66.1  Vital Signs: Temp: 97.3 F (36.3 C) (03/13 1300) BP: 142/71 mmHg (03/13 1300) Pulse Rate: 126 (03/13 1300)  Labs:  Recent Labs  09/08/12 1050  09/08/12 2231 09/09/12 0440 09/10/12 0610  HGB  --   < > 11.6* 9.7* 9.1*  HCT  --   < > 33.8* 27.9* 26.4*  PLT  --   --  186 183 198  LABPROT  --   --   --  15.7* 19.0*  INR  --   --   --  1.28 1.65*  CREATININE 0.53  --   --  0.63 0.56  < > = values in this interval not displayed.  Estimated Creatinine Clearance: 96.1 ml/min (by C-G formula based on Cr of 0.56).   Medical History: Past Medical History  Diagnosis Date  . Hypertension     Assessment: 67 yo male s/p hip fracture/repair for DVT prophylaxis with Coumadin. INR up to 1.65 after 7.5mg  x 1.  Goal of Therapy:  INR 2-3 Monitor platelets by anticoagulation protocol: Yes   Plan:  Coumadin 3 mg po today  Daily INR  [place], [place] 09/10/2012,1:47 PM

## 2012-09-10 NOTE — Progress Notes (Signed)
Physical Therapy Treatment Patient Details Name: Leslie Cox MRN: [id number] DOB: [birth year] Today's Date: 09/10/2012 Time: 9629-5284 PT Time Calculation (min): 19 min  PT Assessment / Plan / Recommendation Comments on Treatment Session  Patient able to transfer today with +2 total A. Patient motivated and aware of precautions and does his best to maintain. Progressing slowly and would benefit from CIR to work on transfer and early mobilization/ambulation    Follow Up Recommendations  CIR     Does the patient have the potential to tolerate intense rehabilitation     Barriers to Discharge        Equipment Recommendations  Rolling walker with 5" wheels    Recommendations for Other Services    Frequency Min 5X/week   Plan Discharge plan remains appropriate;Frequency remains appropriate    Precautions / Restrictions Precautions Precautions: Fall Restrictions Weight Bearing Restrictions: Yes RLE Weight Bearing: Touchdown weight bearing   Pertinent Vitals/Pain 5/10 R leg pain. Patient premedicated    Mobility  Bed Mobility Bed Mobility: Sitting - Scoot to Edge of Bed Supine to Sit: 1: +2 Total assist Supine to Sit: Patient Percentage: 50% Sitting - Scoot to Edge of Bed: 3: Mod assist Details for Bed Mobility Assistance: Cues for technique and positoning. A for LEs and shoulders up out of bed. Patient able to pull up using therapist hand. Able to scoot with assist with chuck pad Transfers Transfers: Stand Pivot Transfers;Sit to Stand;Stand to Sit Sit to Stand: 1: +2 Total assist Sit to Stand: Patient Percentage: 50% Stand to Sit: 1: +2 Total assist Stand to Sit: Patient Percentage: 50% Stand Pivot Transfers: 1: +2 Total assist Stand Pivot Transfers: Patient Percentage: 50% Details for Transfer Assistance: Cues for technique and positioning prior to stand for stand pivot transfers. A for anterior weight shift over BOS as patient unable to bear weight through R LE. A to ensure  TWB and patient able to maintain pretty well. Heavy lean with assist to stabilize to L side    Exercises     PT Diagnosis:    PT Problem List:   PT Treatment Interventions:     PT Goals Acute Rehab PT Goals PT Goal: Supine/Side to Sit - Progress: Progressing toward goal PT Goal: Sit at Edge Of Bed - Progress: Progressing toward goal PT Goal: Sit to Stand - Progress: Progressing toward goal PT Goal: Stand to Sit - Progress: Progressing toward goal PT Transfer Goal: Bed to Chair/Chair to Bed - Progress: Progressing toward goal  Visit Information  Last PT Received On: 09/10/12 Assistance Needed: +2    Subjective Data      Cognition  Cognition Overall Cognitive Status: Appears within functional limits for tasks assessed/performed Arousal/Alertness: Awake/alert Orientation Level: Appears intact for tasks assessed Behavior During Session: [facility] for tasks performed    Balance  Static Sitting Balance Static Sitting - Balance Support: Right upper extremity supported;Feet supported Static Sitting - Level of Assistance: 5: Stand by assistance Static Sitting - Comment/# of Minutes: sat EOB for ~5 minutes with cues for posture  End of Session PT - End of Session Equipment Utilized During Treatment: Gait belt Activity Tolerance: Patient tolerated treatment well Patient left: in chair;with call bell/phone within reach Nurse Communication: Mobility status   GP     Fredrich Birks 09/10/2012, 10:57 AM 09/10/2012 Fredrich Birks PTA [phone] pager [phone] office

## 2012-09-10 NOTE — Progress Notes (Signed)
Foley placed after pt unable to void.  Immediate return of 375.  Pt tolerated well.  Scrotum is enlarged and ecchymosed.

## 2012-09-10 NOTE — Progress Notes (Signed)
Orthopaedic Trauma Service (OTS)  Subjective: C/o pain but denies numbness, motor loss, or other complaint.  Controlled with iv meds but has not been oob to chair.  Objective: Current Vitals Blood pressure 117/69, pulse 106, temperature 98.2 F (36.8 C), temperature source Oral, resp. rate 16, height 5\' 7"  (1.702 m), weight 182 lb 12.2 oz (82.9 kg), SpO2 97.00%. Vital signs in last 24 hours: Temp:  [97.8 F (36.6 C)-100.2 F (37.9 C)] 98.2 F (36.8 C) (03/13 0554) Pulse Rate:  [88-132] 106 (03/13 0554) Resp:  [13-21] 16 (03/13 0554) BP: (112-150)/(61-81) 117/69 mmHg (03/13 0554) SpO2:  [90 %-97 %] 97 % (03/13 0554)  Intake/Output from previous day: 03/12 0701 - 03/13 0700 In: 2880 [P.O.:480; I.V.:2400] Out: 1600 [Urine:1600]  LABS  Recent Labs  09/08/12 0620 09/08/12 1827 09/08/12 2231 09/09/12 0440 09/10/12 0610  HGB 10.6* 8.2* 11.6* 9.7* 9.1*    Recent Labs  09/09/12 0440 09/10/12 0610  WBC 7.8 7.8  RBC 3.18* 2.99*  HCT 27.9* 26.4*  PLT 183 198    Recent Labs  09/08/12 1050 09/08/12 1827 09/09/12 0440  NA 137 137 135  K 4.0 4.3 4.3  CL 99  --  101  CO2 28  --  25  BUN 7  --  10  CREATININE 0.53  --  0.63  GLUCOSE 100* 122* 120*  CALCIUM 8.7  --  7.6*    Recent Labs  09/09/12 0440 09/10/12 0610  INR 1.28 1.65*    Physical Exam  Appears to be in pain, but A&O x 4; wife at bedside RRR No wheezing RLE  Sens DPN, SPN, TN intact  Motor EHL, ext, flex, evers 5/5  DP 2+, PT 2+ Wounds clean and dry without any drainage   Imaging Dg Pelvis 1-2 Views  09/08/2012  *RADIOLOGY REPORT*  Clinical Data: Right acetabular fracture fixation  DG C-ARM GT 120 MIN,PELVIS - 1-2 VIEW  Comparison:  09/06/2012  Findings: Four intraoperative images demonstrate side chain and screw fixation of the previously seen medial right acetabular fracture with fracture fragments in near anatomic alignment. Overlying skin staples are noted.  No evidence for hardware  failure.  IMPRESSION:  Expected intraoperative appearance after right acetabular fracture fixation.   Original Report Authenticated By: Christiana Pellant, M.D.    Dg Pelvis Comp Min 3v  09/09/2012  *RADIOLOGY REPORT*  Clinical Data: Right acetabulum fracture.  JUDET PELVIS - 3+ VIEW  Comparison: Radiographs dated 09/07/2012  Findings: The patient has undergone open reduction and internal fixation of the right acetabular fracture.  Plates and screws have been inserted across the fractures and anatomic alignment has been restored.  Soft tissue drain in place.  IMPRESSION: Restoration of anatomic alignment of the right acetabular fracture after open reduction and internal fixation.   Original Report Authenticated By: Francene Boyers, M.D.    Dg C-arm Gt 120 Min  09/08/2012  *RADIOLOGY REPORT*  Clinical Data: Right acetabular fracture fixation  DG C-ARM GT 120 MIN,PELVIS - 1-2 VIEW  Comparison:  09/06/2012  Findings: Four intraoperative images demonstrate side chain and screw fixation of the previously seen medial right acetabular fracture with fracture fragments in near anatomic alignment. Overlying skin staples are noted.  No evidence for hardware failure.  IMPRESSION:  Expected intraoperative appearance after right acetabular fracture fixation.   Original Report Authenticated By: Christiana Pellant, M.D.     Assessment/Plan: POD 1 s/p ORIF R acetabulum Acute blood loss anemia but will hold on transfusion for now  xfer to floor  TTWB Lovenox, coumadin  Myrene Galas, MD [facility] [phone] (p)

## 2012-09-10 NOTE — Consult Note (Signed)
Physical Medicine and Rehabilitation Consult Reason for Consult: Right acetabular fracture Referring Physician: Dr. Carola Frost   HPI: [name] is a 67 y.o. right-handed male admitted 09/05/2012 after mechanical fall. Patient reports slipping with point of impact being the right side of his body. Fall from about 3-4 feet onto concrete. There was no loss of consciousness. X-rays and imaging revealed a right acetabulum fracture anterior column and posterior hemitransverse. Orthopedic services consulted patient placed entrance tibial traction and later underwent operative repair of right acetabular fracture/ORIF 09/08/2012 per Dr. Carola Frost. Touchdown weightbearing right lower extremity. Placed on Coumadin for DVT prophylaxis. Postoperative anemia with latest hemoglobin 9.7. Physical therapy evaluation completed 09/09/2012 with recommendations of physical medicine rehabilitation consult to consider inpatient rehabilitation services  Review of Systems  Gastrointestinal: Positive for constipation.  Musculoskeletal: Positive for myalgias.  All other systems reviewed and are negative.   Past Medical History  Diagnosis Date  . Hypertension    Past Surgical History  Procedure Laterality Date  . Tonsillectomy    . External fixation leg Right 09/05/2012    Procedure: Application of traction bow externally;  Surgeon: Shelda Pal, MD;  Location: [facility] OR;  Service: Orthopedics;  Laterality: Right;   No family history on file. Social History:  reports that he has never smoked. He has never used smokeless tobacco. He reports that he does not drink alcohol or use illicit drugs. 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, nature jobs Functional Status:  Mobility: Bed Mobility Bed Mobility: Supine to Sit;Sit to Supine Supine to Sit: 1: +1 Total assist;Other (comment) (with trapeze) Sit to Supine: 1: +2 Total assist (with trapeze) Sit to Supine: Patient Percentage: 30% Transfers Transfers: Not assessed      ADL:    Cognition: Cognition Arousal/Alertness: Awake/alert Orientation Level: Oriented X4 Cognition Overall Cognitive Status: Impaired Area of Impairment: Following commands Arousal/Alertness: Awake/alert Orientation Level: Appears intact for tasks assessed Behavior During Session: [facility] for tasks performed Following Commands: Follows multi-step commands with increased time Cognition - Other Comments: Seems impaired due to meds  Blood pressure 117/69, pulse 106, temperature 98.2 F (36.8 C), temperature source Oral, resp. rate 16, height 5\' 7"  (1.702 m), weight 82.9 kg (182 lb 12.2 oz), SpO2 97.00%. Physical Exam  Vitals reviewed. HENT:  Head: Normocephalic.  Eyes: EOM are normal.  Neck: Neck supple. No thyromegaly present.  Cardiovascular: Normal rate and regular rhythm.   Pulmonary/Chest: Effort normal and breath sounds normal. No respiratory distress.  Abdominal: Soft. Bowel sounds are normal. He exhibits no distension.  Neurological:  Patient a little groggy and a bit anxious. He did participate with exam. Patient was alert and oriented x3. He can follow 3 step commands  Skin:  Surgical sites dressed    Results for orders placed during the hospital encounter of 09/05/12 (from the past 24 hour(s))  GLUCOSE, CAPILLARY     Status: Abnormal   Collection Time    09/09/12  7:43 AM      Result Value Range   Glucose-Capillary 108 (*) 70 - 99 mg/dL   Comment 1 Notify RN     Dg Pelvis  1-2 Views  09/08/2012  *RADIOLOGY  REPORT*  Clinical Data: Right acetabular fracture fixation  DG C-ARM GT 120 MIN,PELVIS - 1-2 VIEW  Comparison:  09/06/2012  Findings: Four intraoperative images demonstrate side chain and screw fixation of the previously seen medial right acetabular fracture with fracture fragments in near anatomic alignment. Overlying skin staples are noted.  No evidence for hardware failure.  IMPRESSION:  Expected intraoperative appearance after right acetabular fracture fixation.   Original Report Authenticated By: Christiana Pellant, M.D.    Dg Pelvis Comp Min 3v  09/09/2012  *RADIOLOGY REPORT*  Clinical Data: Right acetabulum fracture.  JUDET PELVIS - 3+ VIEW  Comparison: Radiographs dated 09/07/2012  Findings: The patient has undergone open reduction and internal fixation of the right acetabular fracture.  Plates and screws have been inserted across the fractures and anatomic alignment has been restored.  Soft tissue drain in place.  IMPRESSION: Restoration of anatomic alignment of the right acetabular fracture after open reduction and internal fixation.   Original Report Authenticated By: Francene Boyers, M.D.    Dg C-arm Gt 120 Min  09/08/2012  *RADIOLOGY REPORT*  Clinical Data: Right acetabular fracture fixation  DG C-ARM GT 120 MIN,PELVIS - 1-2 VIEW  Comparison:  09/06/2012  Findings: Four intraoperative images demonstrate side chain and screw fixation of the previously seen medial right acetabular fracture with fracture fragments in near anatomic alignment. Overlying skin staples are noted.  No evidence for hardware failure.  IMPRESSION:  Expected intraoperative appearance after right acetabular fracture fixation.   Original Report Authenticated By: Christiana Pellant, M.D.     Assessment/Plan: Diagnosis: right acetabular fx 1. Does the need for close, 24 hr/day medical supervision in concert with the patient's rehab needs make it unreasonable for this patient to be served in a less intensive setting?  Yes 2. Co-Morbidities requiring supervision/potential complications: severe pain 3. Due to bladder management, bowel management, safety, skin/wound care, disease management, medication administration, pain management and patient education, does the patient require 24 hr/day rehab nursing? Yes 4. Does the patient require coordinated care of a physician, rehab nurse, PT (1-2 hrs/day, 5 days/week) and OT (1-2 hrs/day, 5 days/week) to address physical and functional deficits in the context of the above medical diagnosis(es)? Yes Addressing deficits in the following areas: balance, endurance, locomotion, strength, transferring, bowel/bladder control, bathing, dressing, feeding, grooming, toileting and psychosocial support 5. Can the patient actively participate in an intensive therapy program of at least 3 hrs of therapy per day at least 5 days per week? Yes 6. The potential for patient to make measurable gains while on inpatient rehab is excellent 7. Anticipated functional outcomes upon discharge from inpatient rehab are mod I to min assist with PT, mod I to min assist with OT, n/a with SLP. 8. Estimated rehab length of stay to reach the above functional goals is: 2 weeks 9. Does the patient have adequate social supports to accommodate these discharge functional goals? Yes 10. Anticipated D/C setting: Home 11. Anticipated post D/C treatments: HH therapy 12. Overall Rehab/Functional Prognosis: excellent  RECOMMENDATIONS: This patient's condition is appropriate for continued rehabilitative care in the following setting: CIR Patient has agreed to participate in recommended program. Yes Note that insurance prior authorization may be required for reimbursement for recommended care.  Comment:Rehab RN to follow up.   Ranelle Oyster, MD, [place] [facility]     09/10/2012

## 2012-09-10 NOTE — Plan of Care (Signed)
Problem: Phase II Progression Outcomes Goal: Discharge plan established Recommend CIR for further therapy needs after acute care d/c     

## 2012-09-10 NOTE — Discharge Instructions (Signed)
Orthopaedic Trauma Service Discharge Instructions,   General Discharge Instructions  WEIGHT BEARING STATUS: Touch down weight bearing R leg  RANGE OF MOTION/ACTIVITY: range of motion as tolerated Right hip. Avoid extreme ranges  Diet: as you were eating previously.  Can use over the counter stool softeners and bowel preparations, such as Miralax, to help with bowel movements.  Narcotics can be constipating.  Be sure to drink plenty of fluids  STOP SMOKING OR USING NICOTINE PRODUCTS!!!!  As discussed nicotine severely impairs your body's ability to heal surgical and traumatic wounds but also impairs bone healing.  Wounds and bone heal by forming microscopic blood vessels (angiogenesis) and nicotine is a vasoconstrictor (essentially, shrinks blood vessels).  Therefore, if vasoconstriction occurs to these microscopic blood vessels they essentially disappear and are unable to deliver necessary nutrients to the healing tissue.  This is one modifiable factor that you can do to dramatically increase your chances of healing your injury.    (This means no smoking, no nicotine gum, patches, etc)  DO NOT USE NONSTEROIDAL ANTI-INFLAMMATORY DRUGS (NSAID'S)  Using products such as Advil (ibuprofen), Aleve (naproxen), Motrin (ibuprofen) for additional pain control during fracture healing can delay and/or prevent the healing response.  If you would like to take over the counter (OTC) medication, Tylenol (acetaminophen) is ok.  However, some narcotic medications that are given for pain control contain acetaminophen as well. Therefore, you should not exceed more than 4000 mg of tylenol in a day if you do not have liver disease.  Also note that there are may OTC medicines, such as cold medicines and allergy medicines that my contain tylenol as well.  If you have any questions about medications and/or interactions please ask your doctor/PA or your pharmacist.   PAIN MEDICATION USE AND EXPECTATIONS  You have likely  been given narcotic medications to help control your pain.  After a traumatic event that results in an fracture (broken bone) with or without surgery, it is ok to use narcotic pain medications to help control one's pain.  We understand that everyone responds to pain differently and each individual patient will be evaluated on a regular basis for the continued need for narcotic medications. Ideally, narcotic medication use should last no more than 6-8 weeks (coinciding with fracture healing).   As a patient it is your responsibility as well to monitor narcotic medication use and report the amount and frequency you use these medications when you come to your office visit.   We would also advise that if you are using narcotic medications, you should take a dose prior to therapy to maximize you participation.  IF YOU ARE ON NARCOTIC MEDICATIONS IT IS NOT PERMISSIBLE TO OPERATE A MOTOR VEHICLE (MOTORCYCLE/CAR/TRUCK/MOPED) OR HEAVY MACHINERY DO NOT MIX NARCOTICS WITH OTHER CNS (CENTRAL NERVOUS SYSTEM) DEPRESSANTS SUCH AS ALCOHOL       ICE AND ELEVATE INJURED/OPERATIVE EXTREMITY  Using ice and elevating the injured extremity above your heart can help with swelling and pain control.  Icing in a pulsatile fashion, such as 20 minutes on and 20 minutes off, can be followed.    Do not place ice directly on skin. Make sure there is a barrier between to skin and the ice pack.    Using frozen items such as frozen peas works well as the conform nicely to the are that needs to be iced.  USE AN ACE WRAP OR TED HOSE FOR SWELLING CONTROL  In addition to icing and elevation, Ace wraps or TED hose  are used to help limit and resolve swelling.  It is recommended to use Ace wraps or TED hose until you are informed to stop.    When using Ace Wraps start the wrapping distally (farthest away from the body) and wrap proximally (closer to the body)   Example: If you had surgery on your leg or thing and you do not have a splint  on, start the ace wrap at the toes and work your way up to the thigh        If you had surgery on your upper extremity and do not have a splint on, start the ace wrap at your fingers and work your way up to the upper arm  IF YOU ARE IN A SPLINT OR CAST DO NOT REMOVE IT FOR ANY REASON   If your splint gets wet for any reason please contact the office immediately. You may shower in your splint or cast as long as you keep it dry.  This can be done by wrapping in a cast cover or garbage back (or similar)  Do Not stick any thing down your splint or cast such as pencils, money, or hangers to try and scratch yourself with.  If you feel itchy take benadryl as prescribed on the bottle for itching  IF YOU ARE IN A CAM BOOT (BLACK BOOT)  You may remove boot periodically. Perform daily dressing changes as noted below.  Wash the liner of the boot regularly and wear a sock when wearing the boot. It is recommended that you sleep in the boot until told otherwise  CALL THE OFFICE WITH ANY QUESTIONS OR CONCERTS: [phone]    Discharge Wound Care Instructions  Do NOT apply any ointments, solutions or lotions to pin sites or surgical wounds.  These prevent needed drainage and even though solutions like hydrogen peroxide kill bacteria, they also damage cells lining the pin sites that help fight infection.  Applying lotions or ointments can keep the wounds moist and can cause them to breakdown and open up as well. This can increase the risk for infection. When in doubt call the office.  Surgical incisions should be dressed daily.  If any drainage is noted, use one layer of adaptic, then gauze, Kerlix, and an ace wrap.  Once the incision is completely dry and without drainage, it may be left open to air out.  Showering may begin 36-48 hours later.  Cleaning gently with soap and water.  Traumatic wounds should be dressed daily as well.    One layer of adaptic, gauze, Kerlix, then ace wrap.  The adaptic can be  discontinued once the draining has ceased    If you have a wet to dry dressing: wet the gauze with saline the squeeze as much saline out so the gauze is moist (not soaking wet), place moistened gauze over wound, then place a dry gauze over the moist one, followed by Kerlix wrap, then ace wrap.

## 2012-09-10 NOTE — Evaluation (Signed)
Occupational Therapy Evaluation Patient Details Name: Leslie Cox MRN: [id number] DOB: [birth year] Today's Date: 09/10/2012 Time: 1308-6578 OT Time Calculation (min): 19 min  OT Assessment / Plan / Recommendation Clinical Impression  Pt demos decline in function with ADLs, strength, activity tolerance, safety and balance following R femur fx. Pt would benefit from OT services to address these impairments to help retsore PLOF    OT Assessment  Patient needs continued OT Services    Follow Up Recommendations       Barriers to Discharge Decreased caregiver support Pt's wife unable to provied adequate care at this time, [facility] therapy may not be adequate  Equipment Recommendations  Tub/shower seat;Other (comment) (TBD at [facility])    Recommendations for Other Services Rehab consult  Frequency  Min 2X/week    Precautions / Restrictions Precautions Precautions: Fall Restrictions Weight Bearing Restrictions: Yes RLE Weight Bearing: Touchdown weight bearing       ADL  Eating/Feeding: Performed;Set up Grooming: Performed;Wash/dry hands;Wash/dry face;Brushing hair;Supervision/safety;Set up Where Assessed - Grooming: Unsupported sitting Upper Body Bathing: Simulated;Supervision/safety;Set up Lower Body Bathing: +1 Total assistance Upper Body Dressing: Performed;Supervision/safety;Set up;Minimal assistance;Other (comment) (min A due to IV lines) Lower Body Dressing: +1 Total assistance Toilet Transfer: Simulated;+2 Total assistance Toilet Transfer Method: Stand pivot Toileting - Clothing Manipulation and Hygiene: +1 Total assistance Where Assessed - Toileting Clothing Manipulation and Hygiene: Standing Transfers/Ambulation Related to ADLs: pt required verbal cues for correct hand placementand for TDWB    OT Diagnosis: Generalized weakness;Acute pain  OT Problem List: Decreased strength;Decreased knowledge of use of DME or AE;Impaired balance (sitting and/or standing);Pain;Decreased activity  tolerance OT Treatment Interventions: Self-care/ADL training;Balance training;Therapeutic exercise;Neuromuscular education;Therapeutic activities;DME and/or AE instruction;Patient/family education   OT Goals Acute Rehab OT Goals OT Goal Formulation: With patient Time For Goal Achievement: 09/17/12 Potential to Achieve Goals: Good ADL Goals Pt Will Perform Lower Body Bathing: with mod assist;with max assist;with adaptive equipment ADL Goal: Lower Body Bathing - Progress: Goal set today Pt Will Perform Lower Body Dressing: with mod assist;with max assist;with adaptive equipment ADL Goal: Lower Body Dressing - Progress: Goal set today Pt Will Transfer to Toilet: with max assist;with DME;Maintaining weight bearing status ADL Goal: Toilet Transfer - Progress: Goal set today Pt Will Perform Toileting - Clothing Manipulation: with max assist;Sitting on 3-in-1 or toilet;Standing ADL Goal: Toileting - Clothing Manipulation - Progress: Goal set today Pt Will Perform Tub/Shower Transfer: Tub transfer;with DME;Grab bars ADL Goal: Tub/Shower Transfer - Progress: Goal set today  Visit Information  Last OT Received On: 09/10/12 Assistance Needed: +2 PT/OT Co-Evaluation/Treatment: Yes    Subjective Data  Subjective: " I hurt and it makes me feel sick on my stomach " Patient Stated Goal: To return home   Prior Functioning     Home Living Lives With: Spouse Type of Home: House Home Access: Stairs to enter Secretary/administrator of Steps: 1 Entrance Stairs-Rails: None Home Layout: One level Bathroom Shower/Tub: Engineer, manufacturing systems: Handicapped height Home Adaptive Equipment: None Prior Function Level of Independence: Independent Able to Take Stairs?: Yes Driving: Yes Vocation: Full time employment Dominant Hand: Right         Vision/Perception Vision - History Baseline Vision: Wears glasses all the time Patient Visual Report: No change from  baseline Perception Perception: Within Functional Limits   Cognition  Cognition Overall Cognitive Status: Appears within functional limits for tasks assessed/performed Arousal/Alertness: Awake/alert Orientation Level: Appears intact for tasks assessed Behavior During Session: [facility] for tasks performed    Extremity/Trunk Assessment Right  Upper Extremity Assessment RUE ROM/Strength/Tone: [facility] for tasks assessed Left Upper Extremity Assessment LUE ROM/Strength/Tone: WFL for tasks assessed     Mobility Bed Mobility Bed Mobility: Sitting - Scoot to Edge of Bed Supine to Sit: 1: +2 Total assist Supine to Sit: Patient Percentage: 50% Sitting - Scoot to Edge of Bed: 3: Mod assist Sit to Supine: 1: +2 Total assist Sit to Supine: Patient Percentage: 30% Details for Bed Mobility Assistance: Cues for technique and positoning. A for LEs and shoulders up out of bed. Patient able to pull up using therapist hand. Able to scoot with assist with chuck pad Transfers Sit to Stand: 1: +2 Total assist Sit to Stand: Patient Percentage: 50% Stand to Sit: 1: +2 Total assist Stand to Sit: Patient Percentage: 50% Details for Transfer Assistance: Cues for technique and positioning prior to stand for stand pivot transfers. A for anterior weight shift over BOS as patient unable to bear weight through R LE. A to ensure TWB and patient able to maintain pretty well. Heavy lean with assist to stabilize to L side     Exercise     Balance Balance Balance Assessed: No    End of Session OT - End of Session Equipment Utilized During Treatment: Gait belt Activity Tolerance: Patient tolerated treatment well Patient left: in chair;with call bell/phone within reach;with family/visitor present  GO     Galen Manila 09/10/2012, 11:18 AM

## 2012-09-10 NOTE — Progress Notes (Signed)
Orthopaedic Trauma Service Progress Note  Subjective  Doing fair Transferred to 5N yesterday C/o pain and soreness R hip  Tolerating clear liquid diet, wants to advance but not to reg just yet  Denies any numbness or tingling No CP or SOB No n/v   Objective  BP 117/69  Pulse 106  Temp(Src) 98.2 F (36.8 C) (Oral)  Resp 16  Ht 5\' 7"  (1.702 m)  Wt 82.9 kg (182 lb 12.2 oz)  BMI 28.62 kg/m2  SpO2 97%  Patient Vitals for the past 24 hrs:  BP Temp Temp src Pulse Resp SpO2  09/10/12 0554 117/69 mmHg 98.2 F (36.8 C) - 106 16 97 %  09/10/12 0400 - - - - 15 93 %  09/10/12 0200 112/61 mmHg 99.2 F (37.3 C) - 126 18 95 %  09/10/12 0000 - - - - 15 94 %  09/09/12 2124 122/66 mmHg 97.8 F (36.6 C) - 112 20 90 %  09/09/12 1925 - - - - 13 93 %  09/09/12 1630 150/81 mmHg 100.2 F (37.9 C) Oral 132 21 90 %  09/09/12 1600 - - - - 18 90 %  09/09/12 1209 138/74 mmHg 98.6 F (37 C) Oral 88 16 94 %    Intake/Output     03/12 0701 - 03/13 0700 03/13 0701 - 03/14 0700   P.O. 480    I.V. (mL/kg) 2400 (29)    Blood     IV Piggyback     Total Intake(mL/kg) 2880 (34.7)    Urine (mL/kg/hr) 1600 (0.8)    Drains     Blood     Total Output 1600     Net +1280            Labs Results for CHICK, COUSINS (MRN [id number]) as of 09/10/2012 08:32  Ref. Range 09/10/2012 06:10  Sodium Latest Range: 135-145 mEq/L 135  Potassium Latest Range: 3.5-5.1 mEq/L 4.5  Chloride Latest Range: 96-112 mEq/L 101  CO2 Latest Range: 19-32 mEq/L 25  BUN Latest Range: 6-23 mg/dL 9  Creatinine Latest Range: 0.50-1.35 mg/dL 4.09  Calcium Latest Range: 8.4-10.5 mg/dL 7.9 (L)  GFR calc non Af Amer Latest Range: >90 mL/min >90  GFR calc Af Amer Latest Range: >90 mL/min >90  Glucose Latest Range: 70-99 mg/dL 811 (H)  WBC Latest Range: 4.0-10.5 K/uL 7.8  RBC Latest Range: 4.22-5.81 MIL/uL 2.99 (L)  Hemoglobin Latest Range: 13.0-17.0 g/dL 9.1 (L)  HCT Latest Range: 39.0-52.0 % 26.4 (L)  MCV Latest Range:  78.0-100.0 fL 88.3  MCH Latest Range: 26.0-34.0 pg 30.4  MCHC Latest Range: 30.0-36.0 g/dL 91.4  RDW Latest Range: 11.5-15.5 % 13.4  Platelets Latest Range: 150-400 K/uL 198  Prothrombin Time Latest Range: 11.6-15.2 seconds 19.0 (H)  INR Latest Range: 0.00-1.49  1.65 (H)     Exam  Gen:  Awake and alert, NAD Lungs: clear B Cardiac: s1 and s2, tachy but reg Abd:  Distended, + BS, NT Pelvis:  Incisions look great, drain d/c'd this am Ext:            Right Lower Extremity    Wounds stable  DPN, SPN, TN, LFCN, obturator nv sensation intact  EHL, FHL, AT, PT, peroneals, gastroc motor intact  Pt not willing to move knee or hip for me this am  Ext warm  + DP pulse  TED hose in place  No DCT  Compartments soft and NT    Assessment and Plan  67 year old male status post ground level fall  1. Mechanical ground-level fall 2. Complex right both column acetabular fracture. OTA 62-C2 POD 1  TDWB R leg  No formal ROM restrictions, avoid extreme ranges  Ice and elevate   TED hose  PT/OT  Formal CIR consult pending, PT recommends CIR                                     3. Hypertension             Stable continue to monitor 4. Pain  D/c PCA  Norco and ultram PRN  IV morphine for breakthrough pain  Pt completed IV tylenol, convert to PO tylenol PRN             5. FEN  Advance to full liquid diet  Bowel regimen  Continue to monitor for ileus   Decreased IVF to [facility], very good U/O            6. DVT/PE prophylaxis             lovenox bridge to coumadin   7. Metabolic bone disease  Vitamin D (25 OH), mildly decreased, supplement  Additional labs pending 8. Disposition             continue with PT/OT  Formal CIR consult  Advance diet gently   Mearl Latin, PA-C [facility] [phone] (P) 09/10/2012 8:30 AM

## 2012-09-10 NOTE — Progress Notes (Signed)
  Rehab admissions - Evaluated for possible admission.  I spoke with patient.  He lives with his wife and wife can assist after discharge.  I will begin authorization process with [employer].  Awaiting OT consult.  Currently, rehab beds are full.  Will follow up after I have decision from insurance case manager.  Call me for questions.  #[phone]

## 2012-09-10 NOTE — Progress Notes (Signed)
[facility]opaedic [facility]uma [facility] follow up  Called this pm by nurse regarding vomiting, continued abd distension and dec PO intake.  Will have NGT placed for ileus Pt will be NPO for now Increase IVF to 125 cc/hr Try to minimize narcotics as much as possible Keep serum K+ at a normal levels MOBILIZE pt   Will write for PRN suppository as well, it has been about  5 days since last BM  Continue to monitor I&O's closely   Mearl Latin, PA-C [facility] [phone] (P) 09/10/2012 1:09 PM

## 2012-09-11 DIAGNOSIS — K560 Paralytic ileus: Secondary | ICD-10-CM

## 2012-09-11 LAB — URINALYSIS, ROUTINE W REFLEX MICROSCOPIC
Glucose, UA: NEGATIVE mg/dL
Leukocytes, UA: NEGATIVE
Nitrite: NEGATIVE
Protein, ur: 30 mg/dL — AB
Urobilinogen, UA: 0.2 mg/dL (ref 0.0–1.0)

## 2012-09-11 LAB — PROTIME-INR
INR: 7.82 (ref 0.00–1.49)
Prothrombin Time: 59.2 seconds — ABNORMAL HIGH (ref 11.6–15.2)
Prothrombin Time: 60.1 seconds — ABNORMAL HIGH (ref 11.6–15.2)
Prothrombin Time: 17.7 seconds — ABNORMAL HIGH (ref 11.6–15.2)

## 2012-09-11 LAB — BASIC METABOLIC PANEL
BUN: 8 mg/dL (ref 6–23)
Chloride: 100 mEq/L (ref 96–112)
GFR calc Af Amer: >90 mL/min (ref >90)
Potassium: 4.8 mEq/L (ref 3.5–5.1)

## 2012-09-11 LAB — VITAMIN D 1,25 DIHYDROXY
Vitamin D 1, 25 (OH)2 Total: 49 pg/mL (ref 18–72)
Vitamin D3 1, 25 (OH)2: 49 pg/mL

## 2012-09-11 LAB — CBC
Hemoglobin: 9 g/dL — ABNORMAL LOW (ref 13.0–17.0)
MCH: 30.4 pg (ref 26.0–34.0)
RBC: 2.96 MIL/uL — ABNORMAL LOW (ref 4.22–5.81)

## 2012-09-11 MED ORDER — VITAMIN K1 10 MG/ML IJ SOLN
5.0000 mg | Freq: Once | INTRAVENOUS | Status: AC
  Administered 2012-09-11: 5 mg via INTRAVENOUS
  Filled 2012-09-11: qty 0.5

## 2012-09-11 MED ORDER — TAMSULOSIN HCL 0.4 MG PO CAPS
0.8000 mg | ORAL_CAPSULE | Freq: Every day | ORAL | Status: DC
  Administered 2012-09-11 – 2012-09-13 (×3): 0.8 mg via ORAL
  Filled 2012-09-11 (×4): qty 2

## 2012-09-11 MED ORDER — METOCLOPRAMIDE HCL 5 MG/ML IJ SOLN
10.0000 mg | Freq: Four times a day (QID) | INTRAMUSCULAR | Status: DC
  Administered 2012-09-11 – 2012-09-13 (×7): 10 mg via INTRAVENOUS
  Filled 2012-09-11 (×9): qty 2

## 2012-09-11 MED ORDER — MENTHOL 3 MG MT LOZG
1.0000 | LOZENGE | OROMUCOSAL | Status: DC | PRN
  Filled 2012-09-11: qty 9

## 2012-09-11 MED ORDER — DIAZEPAM 5 MG/ML IJ SOLN
2.5000 mg | Freq: Once | INTRAMUSCULAR | Status: AC
  Administered 2012-09-11: 2.5 mg via INTRAVENOUS
  Filled 2012-09-11: qty 2

## 2012-09-11 MED ORDER — SODIUM CHLORIDE 0.9 % IV SOLN
25.0000 mg | Freq: Four times a day (QID) | INTRAVENOUS | Status: DC | PRN
  Filled 2012-09-11: qty 1

## 2012-09-11 MED ORDER — TRAMADOL HCL 50 MG PO TABS
100.0000 mg | ORAL_TABLET | Freq: Four times a day (QID) | ORAL | Status: DC
  Administered 2012-09-11 – 2012-09-14 (×12): 100 mg via ORAL
  Filled 2012-09-11 (×12): qty 2

## 2012-09-11 MED ORDER — ENOXAPARIN SODIUM 40 MG/0.4ML ~~LOC~~ SOLN: 40.0000 mg | Freq: Every day | SUBCUTANEOUS | Status: DC

## 2012-09-11 MED ORDER — HYDROCODONE-ACETAMINOPHEN 10-325 MG PO TABS
0.5000 | ORAL_TABLET | ORAL | Status: DC | PRN
  Administered 2012-09-11 – 2012-09-14 (×3): 2 via ORAL
  Filled 2012-09-11 (×3): qty 2

## 2012-09-11 NOTE — Progress Notes (Signed)
I have [employer] authorization for acute inpatient rehab admission for Monday, 03/17, if patient is medically ready on Monday.  I will follow up on Monday.  Call me for questions.  #[phone]

## 2012-09-11 NOTE — Progress Notes (Signed)
We discussed findings and agreed on plan. Will obtain trauma service consult if worsens.  Myrene Galas, MD [facility] [phone] (p)

## 2012-09-11 NOTE — Progress Notes (Signed)
Physical Therapy Treatment Patient Details Name: Leslie Cox MRN: [id number] DOB: [birth year] Today's Date: 09/11/2012 Time: 4098-1191 PT Time Calculation (min): 24 min  PT Assessment / Plan / Recommendation Comments on Treatment Session  Patient progressing well and able to hop this morning. Mobility limited by NG tube and unable to clamp. Believe patient will be able to start hopping with RW for ambulation soon. Patient is a great candidate for CIR    Follow Up Recommendations  CIR     Does the patient have the potential to tolerate intense rehabilitation     Barriers to Discharge        Equipment Recommendations  Rolling walker with 5" wheels    Recommendations for Other Services    Frequency Min 5X/week   Plan Discharge plan remains appropriate;Frequency remains appropriate    Precautions / Restrictions Precautions Precautions: Fall Restrictions RLE Weight Bearing: Touchdown weight bearing   Pertinent Vitals/Pain     Mobility  Bed Mobility Supine to Sit: 3: Mod assist;HOB flat;With rails Sitting - Scoot to Edge of Bed: 3: Mod assist Details for Bed Mobility Assistance: Cues for technique and positoning. A for LEs and shoulders up out of bed. Patient able to pull up using therapist hand. Able to scoot with assist with chuck pad. Patient able to lift shoulders better himself today.  Transfers Sit to Stand: 3: Mod assist;From chair/3-in-1;With upper extremity assist Stand to Sit: 3: Mod assist;To chair/3-in-1;With upper extremity assist;With armrests Stand Pivot Transfers: 3: Mod assist Details for Transfer Assistance: Patient able to stand with ne assist and use of RW this session. Patient does a great job of maintaining TWB. Patient able to sue RW to hop using RW. Cues for positioning.     Exercises     PT Diagnosis:    PT Problem List:   PT Treatment Interventions:     PT Goals Acute Rehab PT Goals PT Goal: Supine/Side to Sit - Progress: Progressing toward  goal PT Goal: Sit to Stand - Progress: Progressing toward goal PT Goal: Stand to Sit - Progress: Progressing toward goal PT Transfer Goal: Bed to Chair/Chair to Bed - Progress: Progressing toward goal  Visit Information  Last PT Received On: 09/11/12 Assistance Needed: +2 (Follow in chair)    Subjective Data      Cognition  Cognition Overall Cognitive Status: Appears within functional limits for tasks assessed/performed Arousal/Alertness: Awake/alert Orientation Level: Appears intact for tasks assessed Behavior During Session: [facility] for tasks performed    Balance     End of Session PT - End of Session Equipment Utilized During Treatment: Gait belt Activity Tolerance: Patient tolerated treatment well Patient left: in chair Nurse Communication: Mobility status   GP     Fredrich Birks 09/11/2012, 12:06 PM 09/11/2012 Fredrich Birks PTA [phone] pager [phone] office

## 2012-09-11 NOTE — Progress Notes (Signed)
[facility] (OTS)  Subjective: 3 Days Post-Op Procedure(s) (LRB): OPEN REDUCTION INTERNAL FIXATION (ORIF) ACETABULAR FRACTURE (Right) Patient reports pain as moderate.  Up to chair yesterday. C/o scrotal distension, sore throat secondary to NGT, continued distension and no BM, +flatus  Objective: Current Vitals Blood pressure 161/79, pulse 104, temperature 98.4 F (36.9 C), temperature source Oral, resp. rate 18, height 5\' 7"  (1.702 m), weight 182 lb 12.2 oz (82.9 kg), SpO2 99.00%. Vital signs in last 24 hours: Temp:  [97.3 F (36.3 C)-98.9 F (37.2 C)] 98.4 F (36.9 C) (03/14 0548) Pulse Rate:  [104-126] 104 (03/14 0548) Resp:  [18-20] 18 (03/14 0548) BP: (142-170)/(71-86) 161/79 mmHg (03/14 0548) SpO2:  [90 %-99 %] 99 % (03/14 0548)  Intake/Output from previous day: 03/13 0701 - 03/14 0700 In: 2487.5 [I.V.:2307.5; IV Piggyback:180] Out: 2435 [Urine:1675; Emesis/NG output:760]  LABS  Recent Labs  09/08/12 1827 09/08/12 2231 09/09/12 0440 09/10/12 0610 09/11/12 0540  HGB 8.2* 11.6* 9.7* 9.1* 9.0*    Recent Labs  09/10/12 0610 09/11/12 0540  WBC 7.8 8.2  RBC 2.99* 2.96*  HCT 26.4* 25.7*  PLT 198 230    Recent Labs  09/10/12 0610 09/11/12 0540  NA 135 134*  K 4.5 4.8  CL 101 100  CO2 25 27  BUN 9 8  CREATININE 0.56 0.60  GLUCOSE 101* 108*  CALCIUM 7.9* 8.1*    Recent Labs  09/11/12 0540 09/11/12 0841  INR 7.66* 7.82*    Physical Exam  Abdominal wound w slight drainage from drain site otherwise both surgical wounds pristine Intact RLE Sens DPN, SPN, TN intact, Motor EHL, ext, flex, evers 5/5, DP 2+, PT 2+ Scrotal swelling moderate with ecchymosis; foley in place   Imaging No results found.  Assessment/Plan: 3 Days Post-Op Procedure(s) (LRB): OPEN REDUCTION INTERNAL FIXATION (ORIF) ACETABULAR FRACTURE (Right)  Trauma surg consult for ileus management assistance, anticoag recs, and scrotal swelling-->appreciate Dr.  Dixon Boos and PA Ramiro Harvest' involvement Supratherapuetic INR after low dose advancement (repeat INR valid); will hold further coumadin, give vit K, and likely switch to Lovenox as this may indicate increased sensitivity Repeat h/h/ tomorrow Continue PT  Rehab anticipated within next two days if ileus resolves  Myrene Galas, MD [facility] [phone] (p)    09/11/2012, 10:20 AM

## 2012-09-11 NOTE — Consult Note (Signed)
Reason for Consult:Adynamic ileus Referring Physician: handy  Leslie Cox is an 67 y.o. male.  HPI: Otherwise healthy 67 y.o. Male, ground level fall in the ice, fractured his right acetabulum which was fixed on 3/11.  Postoperatively he developed progressive abdominal distension.  NGT placed yesterday which since that time has drained over 800cc after he had vomited several times  He also was placed on Coumadin and now has a markedly supratherapeutic INR  We were asked to see him in consultation.  Past Medical History  Diagnosis Date  . Hypertension     Past Surgical History  Procedure Laterality Date  . Tonsillectomy    . External fixation leg Right 09/05/2012    Procedure: Application of traction bow externally;  Surgeon: Shelda Pal, MD;  Location: [facility] OR;  Service: Orthopedics;  Laterality: Right;    No family history on file.  Social History:  reports that he has never smoked. He has never used smokeless tobacco. He reports that he does not drink alcohol or use illicit drugs.  Allergies: No Known Allergies  Medications: I have reviewed the patient's current medications.  Results for orders placed during the hospital encounter of 09/05/12 (from the past 48 hour(s))  CBC     Status: Abnormal   Collection Time    09/10/12  6:10 AM      Result Value Range   WBC 7.8  4.0 - 10.5 K/uL   RBC 2.99 (*) 4.22 - 5.81 MIL/uL   Hemoglobin 9.1 (*) 13.0 - 17.0 g/dL   HCT 86.5 (*) 78.4 - 69.6 %   MCV 88.3  78.0 - 100.0 fL   MCH 30.4  26.0 - 34.0 pg   MCHC 34.5  30.0 - 36.0 g/dL   RDW 29.5  28.4 - 13.2 %   Platelets 198  150 - 400 K/uL  BASIC METABOLIC PANEL     Status: Abnormal   Collection Time    09/10/12  6:10 AM      Result Value Range   Sodium 135  135 - 145 mEq/L   Potassium 4.5  3.5 - 5.1 mEq/L   Chloride 101  96 - 112 mEq/L   CO2 25  19 - 32 mEq/L   Glucose, Bld 101 (*) 70 - 99 mg/dL   BUN 9  6 - 23 mg/dL   Creatinine, Ser 4.40  0.50 - 1.35 mg/dL   Calcium 7.9 (*) 8.4 -  10.5 mg/dL   GFR calc non Af Amer >90  >90 mL/min   GFR calc Af Amer >90  >90 mL/min   Comment:            The eGFR has been calculated     using the CKD EPI equation.     This calculation has not been     validated in all clinical     situations.     eGFR's persistently     <90 mL/min signify     possible Chronic Kidney Disease.  PROTIME-INR     Status: Abnormal   Collection Time    09/10/12  6:10 AM      Result Value Range   Prothrombin Time 19.0 (*) 11.6 - 15.2 seconds   INR 1.65 (*) 0.00 - 1.49  CBC     Status: Abnormal   Collection Time    09/11/12  5:40 AM      Result Value Range   WBC 8.2  4.0 - 10.5 K/uL   RBC 2.96 (*) 4.22 -  5.81 MIL/uL   Hemoglobin 9.0 (*) 13.0 - 17.0 g/dL   HCT 16.1 (*) 09.6 - 04.5 %   MCV 86.8  78.0 - 100.0 fL   MCH 30.4  26.0 - 34.0 pg   MCHC 35.0  30.0 - 36.0 g/dL   RDW 40.9  81.1 - 91.4 %   Platelets 230  150 - 400 K/uL  PROTIME-INR     Status: Abnormal   Collection Time    09/11/12  5:40 AM      Result Value Range   Prothrombin Time 59.2 (*) 11.6 - 15.2 seconds   Comment: REPEATED TO VERIFY   INR 7.66 (*) 0.00 - 1.49   Comment: REPEATED TO VERIFY     CRITICAL RESULT CALLED TO, READ BACK BY AND VERIFIED WITH:     Seth Bake AT 7829 09/11/12 BY K BARR  BASIC METABOLIC PANEL     Status: Abnormal   Collection Time    09/11/12  5:40 AM      Result Value Range   Sodium 134 (*) 135 - 145 mEq/L   Potassium 4.8  3.5 - 5.1 mEq/L   Chloride 100  96 - 112 mEq/L   CO2 27  19 - 32 mEq/L   Glucose, Bld 108 (*) 70 - 99 mg/dL   BUN 8  6 - 23 mg/dL   Creatinine, Ser 5.62  0.50 - 1.35 mg/dL   Calcium 8.1 (*) 8.4 - 10.5 mg/dL   GFR calc non Af Amer >90  >90 mL/min   GFR calc Af Amer >90  >90 mL/min   Comment:            The eGFR has been calculated     using the CKD EPI equation.     This calculation has not been     validated in all clinical     situations.     eGFR's persistently     <90 mL/min signify     possible Chronic Kidney  Disease.  PROTIME-INR     Status: Abnormal   Collection Time    09/11/12  8:41 AM      Result Value Range   Prothrombin Time 60.1 (*) 11.6 - 15.2 seconds   INR 7.82 (*) 0.00 - 1.49   Comment: CRITICAL RESULT CALLED TO, READ BACK BY AND VERIFIED WITH:     CRANFORD,M RN @ [phone] ON 09/11/12 BY STILTNER,J    No results found.  Review of Systems  Constitutional: Negative.   Gastrointestinal: Positive for nausea and vomiting.  All other systems reviewed and are negative.   Blood pressure 161/79, pulse 104, temperature 98.4 F (36.9 C), temperature source Oral, resp. rate 18, height 5\' 7"  (1.702 m), weight 82.9 kg (182 lb 12.2 oz), SpO2 99.00%. Physical Exam  Constitutional: He is oriented to person, place, and time.  HENT:  Head: Normocephalic and atraumatic.  Eyes: Conjunctivae and EOM are normal. Pupils are equal, round, and reactive to light.  Cardiovascular: Normal rate, regular rhythm and normal heart sounds.   Respiratory: Effort normal and breath sounds normal.  GI: Soft. He exhibits distension (mild). Bowel sounds are decreased. There is tenderness (mild) in the epigastric area and periumbilical area.  Genitourinary:    Penile erythema and penile tenderness present.     Musculoskeletal: Normal range of motion.  Neurological: He is alert and oriented to person, place, and time. He has normal reflexes.  Skin: Skin is warm and dry.    Assessment/Plan: Adynamic ileus, seems  to be resolving. Patient passed a significant amount of flatus this morning.   NGT still draining moderate amount of bilious fluid. Coagulopathy from minimal coumadin administration. Urinary retention with scrotal edema--Will keep foley until we are able to start Flomx or urecholine PI Scrotal swelling should decrease soon, but patient has no prior history of urinary difficulty.  Keep NGT for another 24 hours, then clamp and feed if he continues to improve Vitamin K 5mg  IV for high PT/INR.  Will  repeat again later today and possibly repeat Vit K dose Stop Coumadin  Yehudah Standing O 09/11/2012, 10:35 AM

## 2012-09-11 NOTE — Progress Notes (Signed)
Patient with elevated INR after minimal doses of coumadin. Noted MD order to give lovenox as prophylaxis instead. With an INR of 7.8, patient is currently anticoagulated and pharmacy will reorder lovenox when INR falls below 2.   Plan  Monitor INR daily and order lovenox when INR falls <2.   Thank you,  Brett Fairy, PharmD, BCPS 09/11/2012 11:28 AM

## 2012-09-11 NOTE — Progress Notes (Signed)
Critical lab value of INR=7.66 called.  Paged Dr. Carola Frost.  We will repeat the INR and he will see the patient and make decisions regarding treatment.

## 2012-09-11 NOTE — Progress Notes (Signed)
I have seen and examined the patient. I agree with the findings above.  Budd Palmer, MD 09/11/2012 10:19 AM

## 2012-09-11 NOTE — Progress Notes (Signed)
Rehab admissions - Noted patient now with ileus and NG tube.  Not ready for acute inpatient rehab today.  Also, awaiting response from insurance case manager regarding inpatient rehab.  I will follow up for progress and medical readiness on Monday.  Call me for questions.  #[phone]

## 2012-09-12 ENCOUNTER — Encounter (HOSPITAL_COMMUNITY): Payer: Self-pay | Admitting: Orthopedic Surgery

## 2012-09-12 ENCOUNTER — Inpatient Hospital Stay (HOSPITAL_COMMUNITY): Payer: BC Managed Care – PPO

## 2012-09-12 LAB — BASIC METABOLIC PANEL
Chloride: 97 mEq/L (ref 96–112)
GFR calc Af Amer: >90 mL/min (ref >90)
GFR calc non Af Amer: >90 mL/min (ref >90)
Potassium: 4.1 mEq/L (ref 3.5–5.1)

## 2012-09-12 LAB — PROTIME-INR
INR: 1.35 (ref 0.00–1.49)
Prothrombin Time: 16.4 seconds — ABNORMAL HIGH (ref 11.6–15.2)

## 2012-09-12 LAB — CBC
HCT: 25.2 % — ABNORMAL LOW (ref 39.0–52.0)
Hemoglobin: 8.6 g/dL — ABNORMAL LOW (ref 13.0–17.0)
RDW: 13.1 % (ref 11.5–15.5)
WBC: 8.7 10*3/uL (ref 4.0–10.5)

## 2012-09-12 MED ORDER — BETHANECHOL CHLORIDE 25 MG PO TABS
25.0000 mg | ORAL_TABLET | Freq: Four times a day (QID) | ORAL | Status: DC
  Administered 2012-09-12 – 2012-09-14 (×8): 25 mg via ORAL
  Filled 2012-09-12 (×11): qty 1

## 2012-09-12 MED ORDER — FERROUS SULFATE 300 (60 FE) MG/5ML PO SYRP
300.0000 mg | ORAL_SOLUTION | Freq: Three times a day (TID) | ORAL | Status: DC
  Administered 2012-09-12 (×2): 300 mg
  Filled 2012-09-12 (×6): qty 5

## 2012-09-12 MED ORDER — ENOXAPARIN SODIUM 40 MG/0.4ML ~~LOC~~ SOLN
40.0000 mg | SUBCUTANEOUS | Status: DC
  Administered 2012-09-12 – 2012-09-14 (×3): 40 mg via SUBCUTANEOUS
  Filled 2012-09-12 (×3): qty 0.4

## 2012-09-12 MED ORDER — PANTOPRAZOLE SODIUM 40 MG PO PACK
40.0000 mg | PACK | Freq: Every day | ORAL | Status: DC
  Administered 2012-09-12: 40 mg
  Filled 2012-09-12 (×2): qty 20

## 2012-09-12 MED ORDER — DOCUSATE SODIUM 50 MG/5ML PO LIQD
100.0000 mg | Freq: Two times a day (BID) | ORAL | Status: DC
  Administered 2012-09-12 (×2): 100 mg
  Filled 2012-09-12 (×4): qty 10

## 2012-09-12 MED ORDER — PHENOL 1.4 % MT LIQD
1.0000 | OROMUCOSAL | Status: DC | PRN
  Administered 2012-09-12: 1 via OROMUCOSAL
  Filled 2012-09-12: qty 177

## 2012-09-12 MED ORDER — ACETAMINOPHEN 10 MG/ML IV SOLN
1000.0000 mg | Freq: Four times a day (QID) | INTRAVENOUS | Status: AC
  Administered 2012-09-12 – 2012-09-13 (×4): 1000 mg via INTRAVENOUS
  Filled 2012-09-12 (×5): qty 100

## 2012-09-12 MED ORDER — BISACODYL 10 MG RE SUPP
10.0000 mg | Freq: Two times a day (BID) | RECTAL | Status: DC
  Administered 2012-09-12 – 2012-09-14 (×3): 10 mg via RECTAL
  Filled 2012-09-12 (×6): qty 1

## 2012-09-12 NOTE — Progress Notes (Signed)
Physical Therapy Treatment Patient Details Name: Leslie Cox MRN: [id number] DOB: 1946/05/05 Today's Date: 09/12/2012 Time: 6440-3474 PT Time Calculation (min): 29 min  PT Assessment / Plan / Recommendation Comments on Treatment Session  Pt was able to progress with mobility (ambulation) but appeared to be very drowsy.  RN reports may be due to Robaxin + tylenol that he had recieved earlier in AM.      Follow Up Recommendations  CIR     Does the patient have the potential to tolerate intense rehabilitation     Barriers to Discharge        Equipment Recommendations  Rolling walker with 5" wheels    Recommendations for Other Services    Frequency Min 5X/week   Plan Discharge plan remains appropriate;Frequency remains appropriate    Precautions / Restrictions Precautions Precautions: Fall Restrictions Weight Bearing Restrictions: Yes RLE Weight Bearing: Touchdown weight bearing       Mobility  Bed Mobility Bed Mobility: Supine to Sit;Sitting - Scoot to Edge of Bed Supine to Sit: 1: +2 Total assist;HOB elevated;With rails Supine to Sit: Patient Percentage: 60% Sitting - Scoot to Edge of Bed: 3: Mod assist Details for Bed Mobility Assistance: Cues for technique.  (A) for R LE management & to lift shoulders/trunk to sitting upright with use of draw pad to pivot hips around to EOB Transfers Transfers: Sit to Stand;Stand to Sit;Stand Pivot Transfers Sit to Stand: 1: +2 Total assist;With upper extremity assist;From bed;From chair/3-in-1;With armrests Sit to Stand: Patient Percentage: 60% Stand to Sit: 3: Mod assist;With upper extremity assist;With armrests;To chair/3-in-1 Stand Pivot Transfers: 3: Mod assist Details for Transfer Assistance: Cues for hand placement & R LE positioning to maintain WBing restriction.   (A) to achieve standing, balance, RW management during pivot, & controlled descent.   Ambulation/Gait Ambulation/Gait Assistance: 1: +2 Total  assist Ambulation/Gait: Patient Percentage: 60% Ambulation Distance (Feet): 8 Feet Assistive device: Rolling walker Ambulation/Gait Assistance Details: Cues for safe RW management/advancement, sequencing with TDWing R LE, & reinforcement of WBing restriction.    Gait Pattern: Step-to pattern     PT Goals Acute Rehab PT Goals Time For Goal Achievement: 09/23/12 Potential to Achieve Goals: Good Pt will go Supine/Side to Sit: with min assist PT Goal: Supine/Side to Sit - Progress: Not met Pt will Sit at [facility] of Bed: with modified independence;with unilateral upper extremity support;6-10 min Pt will go Sit to Supine/Side: with min assist Pt will go Sit to Stand: with supervision PT Goal: Sit to Stand - Progress: Not met Pt will go Stand to Sit: with supervision PT Goal: Stand to Sit - Progress: Not met Pt will Transfer Bed to Chair/Chair to Bed: with supervision PT Transfer Goal: Bed to Chair/Chair to Bed - Progress: Not met Pt will Ambulate: 51 - 150 feet;with supervision;with rolling walker PT Goal: Ambulate - Progress: Progressing toward goal Pt will Perform Home Exercise Program: with supervision, verbal cues required/provided  Visit Information  Last PT Received On: 09/12/12 Assistance Needed: +2    Subjective Data      Cognition  Cognition Overall Cognitive Status: Impaired Area of Impairment: Problem solving;Following commands Arousal/Alertness: Lethargic (drowsy) Orientation Level: Appears intact for tasks assessed Behavior During Session:  (drowsy) Following Commands: Follows multi-step commands with increased time Problem Solving: slow to process & required max directional cues for tasks Cognition - Other Comments: Seems impaired due to meds    Balance     End of Session PT - End of Session Equipment Utilized During  Treatment: Gait belt Activity Tolerance: Patient tolerated treatment well Patient left: in chair;with call bell/phone within reach;with  family/visitor present Nurse Communication: Mobility status     Verdell Face, [place] [phone] 09/12/2012

## 2012-09-12 NOTE — Progress Notes (Signed)
Patient ID: Leslie Cox, male   DOB: 13-Jul-1945, 67 y.o.   MRN: [id number]   LOS: 7 days   Subjective: Had some diarrhea, had some reflux vs emesis this morning. Feels generally miserable.   Objective: Vital signs in last 24 hours: Temp:  [97.6 F (36.4 C)-98.3 F (36.8 C)] 97.6 F (36.4 C) (03/15 0500) Pulse Rate:  [107-111] 110 (03/15 0500) Resp:  [18-20] 20 (03/15 0500) BP: (140-163)/(75-86) 163/86 mmHg (03/15 0500) SpO2:  [98 %-99 %] 99 % (03/15 0500) Last BM Date: 09/04/12   NGT: 426ml/24h   Laboratory  CBC  Recent Labs  09/11/12 0540 09/12/12 0700  WBC 8.2 8.7  HGB 9.0* 8.6*  HCT 25.7* 25.2*  PLT 230 272   BMET  Recent Labs  09/11/12 0540 09/12/12 0700  NA 134* 132*  K 4.8 4.1  CL 100 97  CO2 27 28  GLUCOSE 108* 97  BUN 8 7  CREATININE 0.60 0.48*  CALCIUM 8.1* 8.1*   Lab Results  Component Value Date   INR 1.35 09/12/2012   INR 1.50* 09/11/2012   INR 7.82* 09/11/2012      Physical Exam General appearance: alert and mild distress Resp: clear to auscultation bilaterally Cardio: regular rate and rhythm GI: Soft, +BS, mild distension, mild diffuse TTP   Assessment/Plan: Right acetabular fx s/p ORIF Ileus -- Partial. On reglan. Will clamp tube and remove if no N/V within 4 hours. Ortho ordered abd film, will f/u on those Dysuria -- On review of CT scan the hematoma associated with the acetabular fracture is compressing the prostate so this may be an extrinsic obstructive problem. Will start urecholine today and plan voiding trial tomorrow but if he fails that will need foley replaced and probably left in until hematoma resolves Pain -- Have maximized non-narcotic meds to try and reduce retention effect Supertherapeutic INR -- Returned to normal, restart Lovenox   Freeman Caldron, PA-C Pager: [phone] General Trauma PA Pager: [phone]   09/12/2012

## 2012-09-12 NOTE — Progress Notes (Signed)
Subjective: 4 Days Post-Op Procedure(s) (LRB): OPEN REDUCTION INTERNAL FIXATION (ORIF) ACETABULAR FRACTURE (Right) Patient reports pain as 8 on 0-10 scale.    Objective: Vital signs in last 24 hours: Temp:  [97.6 F (36.4 C)-98.3 F (36.8 C)] 97.6 F (36.4 C) (03/15 0500) Pulse Rate:  [107-111] 110 (03/15 0500) Resp:  [18-20] 20 (03/15 0500) BP: (140-163)/(75-86) 163/86 mmHg (03/15 0500) SpO2:  [98 %-99 %] 99 % (03/15 0500)  Intake/Output from previous day: 03/14 0701 - 03/15 0700 In: 2981.3 [I.V.:2981.3] Out: 2575 [Urine:2100; Emesis/NG output:475] Intake/Output this shift:     Recent Labs  09/10/12 0610 09/11/12 0540 09/12/12 0700  HGB 9.1* 9.0* 8.6*    Recent Labs  09/11/12 0540 09/12/12 0700  WBC 8.2 8.7  RBC 2.96* 2.82*  HCT 25.7* 25.2*  PLT 230 272    Recent Labs  09/11/12 0540 09/12/12 0700  NA 134* 132*  K 4.8 4.1  CL 100 97  CO2 27 28  BUN 8 7  CREATININE 0.60 0.48*  GLUCOSE 108* 97  CALCIUM 8.1* 8.1*    Recent Labs  09/11/12 1758 09/12/12 0700  INR 1.50* 1.35    Neurovascular intact Sensation intact distally Incision: no drainage  Assessment/Plan: 4 Days Post-Op Procedure(s) (LRB): OPEN REDUCTION INTERNAL FIXATION (ORIF) ACETABULAR FRACTURE (Right) Up with therapy Will add IV tylenol for pain control.  Limit narcotics.  Liquid stool today will order KUB to see if any improvement in ileus.  Increased Dulcolax suppository to BID.   Will watch sodium closely.  Would recommend keeping NG tube until ileus is resolved. Will see in am.  To Inpatient rehab on Monday if ileus resolved.   Hitoshi Werts J 09/12/2012, 10:02 AM

## 2012-09-12 NOTE — Progress Notes (Signed)
I have seen and examined the pt and agree with PA-Jeffery's note. 

## 2012-09-12 NOTE — Progress Notes (Signed)
Pt continues to c/o severe pelvic pain. Morphine 2mg  IV is lasting only 3 hours. Pt was uncomfortable and restless during the night.

## 2012-09-13 LAB — BASIC METABOLIC PANEL
CO2: 27 mEq/L (ref 19–32)
Calcium: 8.3 mg/dL — ABNORMAL LOW (ref 8.4–10.5)
Chloride: 98 mEq/L (ref 96–112)
GFR calc Af Amer: >90 mL/min (ref >90)
Sodium: 134 mEq/L — ABNORMAL LOW (ref 135–145)

## 2012-09-13 LAB — PROTIME-INR
INR: 1.52 — ABNORMAL HIGH (ref 0.00–1.49)
Prothrombin Time: 17.9 seconds — ABNORMAL HIGH (ref 11.6–15.2)

## 2012-09-13 MED ORDER — FERROUS SULFATE 300 (60 FE) MG/5ML PO SYRP
300.0000 mg | ORAL_SOLUTION | Freq: Three times a day (TID) | ORAL | Status: DC
  Administered 2012-09-14 (×2): 300 mg via ORAL
  Filled 2012-09-13 (×6): qty 5

## 2012-09-13 MED ORDER — LIDOCAINE HCL 2 % EX GEL
CUTANEOUS | Status: DC | PRN
  Filled 2012-09-13: qty 5

## 2012-09-13 MED ORDER — SODIUM CHLORIDE 0.9 % IV SOLN
INTRAVENOUS | Status: DC
  Administered 2012-09-13 (×2): via INTRAVENOUS

## 2012-09-13 MED ORDER — DOCUSATE SODIUM 100 MG PO CAPS
100.0000 mg | ORAL_CAPSULE | Freq: Two times a day (BID) | ORAL | Status: DC
  Administered 2012-09-13 – 2012-09-14 (×3): 100 mg via ORAL
  Filled 2012-09-13 (×4): qty 1

## 2012-09-13 MED ORDER — PANTOPRAZOLE SODIUM 40 MG PO TBEC
40.0000 mg | DELAYED_RELEASE_TABLET | Freq: Every day | ORAL | Status: DC
  Administered 2012-09-13 – 2012-09-14 (×2): 40 mg via ORAL
  Filled 2012-09-13 (×2): qty 1

## 2012-09-13 MED ORDER — METOCLOPRAMIDE HCL 10 MG PO TABS
10.0000 mg | ORAL_TABLET | Freq: Three times a day (TID) | ORAL | Status: DC
  Administered 2012-09-13 – 2012-09-14 (×4): 10 mg via ORAL
  Filled 2012-09-13 (×5): qty 1

## 2012-09-13 NOTE — Progress Notes (Signed)
Patient ID: Leslie Cox, male   DOB: 1945-07-31, 67 y.o.   MRN: [id number]   LOS: 8 days   Subjective: Feels better overall. Having frequent, small loose stools. No N/V after NGT removal. Says he had "a little" of his clears since then.  Objective: Vital signs in last 24 hours: Temp:  [98.3 F (36.8 C)] 98.3 F (36.8 C) (03/16 0545) Pulse Rate:  [93-100] 100 (03/16 0545) Resp:  [18] 18 (03/16 0545) BP: (143-150)/(72-81) 150/72 mmHg (03/16 0545) SpO2:  [96 %-98 %] 96 % (03/16 0545) Last BM Date: 09/12/12 (very small amount, loose stool during day)   Laboratory  BMET  Recent Labs  09/12/12 0700 09/13/12 0514  NA 132* 134*  K 4.1 4.1  CL 97 98  CO2 28 27  GLUCOSE 97 90  BUN 7 6  CREATININE 0.48* 0.47*  CALCIUM 8.1* 8.3*     Physical Exam General appearance: alert and no distress Resp: clear to auscultation bilaterally Cardio: regular rate and rhythm GI: Soft, mild diffuse TTP, moderate distension, +BS   Assessment/Plan: Right acetabular fx s/p ORIF  Ileus -- Resolving. Wean reglan, advance diet to fulls  Dysuria -- On review of CT scan the hematoma associated with the acetabular fracture is compressing the prostate so this may be an extrinsic obstructive problem. Voiding trial today but if he fails that will need foley replaced and probably left in until hematoma resolves  Pain -- Have maximized non-narcotic meds to try and reduce retention effect  Supertherapeutic INR -- Returned to normal, continue Lovenox    Freeman Caldron, PA-C Pager: [phone] General Trauma PA Pager: [phone]   09/13/2012

## 2012-09-13 NOTE — Progress Notes (Signed)
Subjective: 5 Days Post-Op Procedure(s) (LRB): OPEN REDUCTION INTERNAL FIXATION (ORIF) ACETABULAR FRACTURE (Right) Patient reports pain as 4 on 0-10 scale.    Objective: Vital signs in last 24 hours: Temp:  [98.3 F (36.8 C)] 98.3 F (36.8 C) (03/16 0545) Pulse Rate:  [93-100] 100 (03/16 0545) Resp:  [18] 18 (03/16 0545) BP: (143-150)/(72-81) 150/72 mmHg (03/16 0545) SpO2:  [96 %-98 %] 96 % (03/16 0545)  Intake/Output from previous day: 03/15 0701 - 03/16 0700 In: 2450 [P.O.:300; I.V.:1500] Out: 1650 [Urine:1500; Emesis/NG output:150] Intake/Output this shift:     Recent Labs  09/11/12 0540 09/12/12 0700  HGB 9.0* 8.6*    Recent Labs  09/11/12 0540 09/12/12 0700  WBC 8.2 8.7  RBC 2.96* 2.82*  HCT 25.7* 25.2*  PLT 230 272    Recent Labs  09/12/12 0700 09/13/12 0514  NA 132* 134*  K 4.1 4.1  CL 97 98  CO2 28 27  BUN 7 6  CREATININE 0.48* 0.47*  GLUCOSE 97 90  CALCIUM 8.1* 8.3*    Recent Labs  09/12/12 0700 09/13/12 0514  INR 1.35 1.52*    ABD soft Neurovascular intact Sensation intact distally Intact pulses distally Dorsiflexion/Plantar flexion intact Incision: no drainage Still significant scrotal edema Patient ambulated with me into the hall.  He had a episode of dizziness during ambulation and had to sit in the chair.  Will continue to mobilize with physical therapy.  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

## 2012-09-13 NOTE — Progress Notes (Signed)
Ileus resolving.  This patient has been seen and I agree with the findings and treatment plan.  Marta Lamas. Gae Bon, MD, FACS [phone] (pager) [phone] (direct pager) Trauma Surgeon

## 2012-09-14 ENCOUNTER — Encounter (HOSPITAL_COMMUNITY): Payer: Self-pay | Admitting: Orthopedic Surgery

## 2012-09-14 ENCOUNTER — Inpatient Hospital Stay (HOSPITAL_COMMUNITY)
Admission: RE | Admit: 2012-09-14 | Discharge: 2012-09-24 | DRG: 462 | Disposition: A | Discharge status: Home / Self Care | Payer: BC Managed Care – PPO | Source: Intra-hospital | Attending: Physical Medicine & Rehabilitation | Admitting: Physical Medicine & Rehabilitation

## 2012-09-14 DIAGNOSIS — S32409A Unspecified fracture of unspecified acetabulum, initial encounter for closed fracture: Secondary | ICD-10-CM | POA: Diagnosis present

## 2012-09-14 DIAGNOSIS — K560 Paralytic ileus: Secondary | ICD-10-CM | POA: Diagnosis present

## 2012-09-14 DIAGNOSIS — I10 Essential (primary) hypertension: Secondary | ICD-10-CM | POA: Diagnosis present

## 2012-09-14 DIAGNOSIS — K929 Disease of digestive system, unspecified: Secondary | ICD-10-CM | POA: Diagnosis present

## 2012-09-14 DIAGNOSIS — E559 Vitamin D deficiency, unspecified: Secondary | ICD-10-CM | POA: Diagnosis present

## 2012-09-14 DIAGNOSIS — R339 Retention of urine, unspecified: Secondary | ICD-10-CM | POA: Diagnosis present

## 2012-09-14 DIAGNOSIS — S32401A Unspecified fracture of right acetabulum, initial encounter for closed fracture: Secondary | ICD-10-CM

## 2012-09-14 DIAGNOSIS — W19XXXA Unspecified fall, initial encounter: Secondary | ICD-10-CM

## 2012-09-14 DIAGNOSIS — Y849 Medical procedure, unspecified as the cause of abnormal reaction of the patient, or of later complication, without mention of misadventure at the time of the procedure: Secondary | ICD-10-CM | POA: Diagnosis present

## 2012-09-14 DIAGNOSIS — Z5189 Encounter for other specified aftercare: Secondary | ICD-10-CM

## 2012-09-14 DIAGNOSIS — K5900 Constipation, unspecified: Secondary | ICD-10-CM | POA: Diagnosis present

## 2012-09-14 DIAGNOSIS — F411 Generalized anxiety disorder: Secondary | ICD-10-CM | POA: Diagnosis present

## 2012-09-14 DIAGNOSIS — D649 Anemia, unspecified: Secondary | ICD-10-CM | POA: Diagnosis present

## 2012-09-14 HISTORY — DX: Vitamin D deficiency, unspecified: E55.9

## 2012-09-14 LAB — CBC
Hemoglobin: 9.3 g/dL — ABNORMAL LOW (ref 13.0–17.0)
MCH: 29.8 pg (ref 26.0–34.0)
MCH: 29.9 pg (ref 26.0–34.0)
MCV: 86.9 fL (ref 78.0–100.0)
Platelets: 399 10*3/uL (ref 150–400)
Platelets: 457 10*3/uL — ABNORMAL HIGH (ref 150–400)
RBC: 3.12 MIL/uL — ABNORMAL LOW (ref 4.22–5.81)
RBC: 3.14 MIL/uL — ABNORMAL LOW (ref 4.22–5.81)
WBC: 7.5 10*3/uL (ref 4.0–10.5)

## 2012-09-14 LAB — BASIC METABOLIC PANEL
CO2: 29 mEq/L (ref 19–32)
Chloride: 97 mEq/L (ref 96–112)
Glucose, Bld: 118 mg/dL — ABNORMAL HIGH (ref 70–99)
Potassium: 3.6 mEq/L (ref 3.5–5.1)
Sodium: 132 mEq/L — ABNORMAL LOW (ref 135–145)

## 2012-09-14 LAB — CREATININE, SERUM: Creatinine, Ser: 0.44 mg/dL — ABNORMAL LOW (ref 0.50–1.35)

## 2012-09-14 MED ORDER — BETHANECHOL CHLORIDE 25 MG PO TABS
25.0000 mg | ORAL_TABLET | Freq: Four times a day (QID) | ORAL | Status: DC
  Administered 2012-09-14 – 2012-09-16 (×7): 25 mg via ORAL
  Filled 2012-09-14 (×11): qty 1

## 2012-09-14 MED ORDER — FERROUS SULFATE 300 (60 FE) MG/5ML PO SYRP
300.0000 mg | ORAL_SOLUTION | Freq: Three times a day (TID) | ORAL | Status: DC
  Administered 2012-09-14 – 2012-09-16 (×6): 300 mg via ORAL
  Filled 2012-09-14 (×10): qty 5

## 2012-09-14 MED ORDER — HYDROCODONE-ACETAMINOPHEN 5-325 MG PO TABS
1.0000 | ORAL_TABLET | Freq: Four times a day (QID) | ORAL | Status: DC | PRN
  Administered 2012-09-15 (×2): 2 via ORAL
  Administered 2012-09-15 – 2012-09-24 (×8): 1 via ORAL
  Filled 2012-09-14: qty 1
  Filled 2012-09-14: qty 2
  Filled 2012-09-14: qty 1
  Filled 2012-09-14: qty 2
  Filled 2012-09-14 (×5): qty 1
  Filled 2012-09-14: qty 2

## 2012-09-14 MED ORDER — ENOXAPARIN SODIUM 40 MG/0.4ML ~~LOC~~ SOLN
40.0000 mg | SUBCUTANEOUS | Status: DC
  Filled 2012-09-14 (×2): qty 0.4

## 2012-09-14 MED ORDER — METOCLOPRAMIDE HCL 10 MG PO TABS
10.0000 mg | ORAL_TABLET | Freq: Three times a day (TID) | ORAL | Status: DC
  Administered 2012-09-14 – 2012-09-24 (×29): 10 mg via ORAL
  Filled 2012-09-14 (×33): qty 1

## 2012-09-14 MED ORDER — LIDOCAINE HCL 2 % EX GEL
CUTANEOUS | Status: DC | PRN
  Filled 2012-09-14: qty 5

## 2012-09-14 MED ORDER — ACETAMINOPHEN 325 MG PO TABS: 325.0000 mg | ORAL_TABLET | Freq: Four times a day (QID) | ORAL | Status: DC | PRN

## 2012-09-14 MED ORDER — BIOTENE DRY MOUTH MT LIQD
15.0000 mL | Freq: Two times a day (BID) | OROMUCOSAL | Status: DC
  Administered 2012-09-14 – 2012-09-24 (×19): 15 mL via OROMUCOSAL

## 2012-09-14 MED ORDER — LISINOPRIL 2.5 MG PO TABS
2.5000 mg | ORAL_TABLET | Freq: Every day | ORAL | Status: DC
  Administered 2012-09-14 – 2012-09-24 (×11): 2.5 mg via ORAL
  Filled 2012-09-14 (×13): qty 1

## 2012-09-14 MED ORDER — ONDANSETRON HCL 4 MG/2ML IJ SOLN: 4.0000 mg | Freq: Four times a day (QID) | INTRAMUSCULAR | Status: DC | PRN

## 2012-09-14 MED ORDER — SORBITOL 70 % SOLN
30.0000 mL | Freq: Every day | Status: DC | PRN
  Administered 2012-09-15: 30 mL via ORAL
  Filled 2012-09-14: qty 30

## 2012-09-14 MED ORDER — ACETAMINOPHEN 325 MG PO TABS
325.0000 mg | ORAL_TABLET | ORAL | Status: DC | PRN
  Filled 2012-09-14: qty 2

## 2012-09-14 MED ORDER — PHENOL 1.4 % MT LIQD
1.0000 | OROMUCOSAL | Status: DC | PRN
  Filled 2012-09-14: qty 177

## 2012-09-14 MED ORDER — FLEET ENEMA 7-19 GM/118ML RE ENEM: 1.0000 | ENEMA | Freq: Once | RECTAL | Status: DC

## 2012-09-14 MED ORDER — BISACODYL 10 MG RE SUPP
10.0000 mg | Freq: Two times a day (BID) | RECTAL | Status: DC
  Filled 2012-09-14: qty 1

## 2012-09-14 MED ORDER — ENOXAPARIN SODIUM 40 MG/0.4ML ~~LOC~~ SOLN
40.0000 mg | SUBCUTANEOUS | Status: DC
  Administered 2012-09-15 – 2012-09-23 (×9): 40 mg via SUBCUTANEOUS
  Filled 2012-09-14 (×10): qty 0.4

## 2012-09-14 MED ORDER — ONDANSETRON HCL 4 MG PO TABS
4.0000 mg | ORAL_TABLET | Freq: Four times a day (QID) | ORAL | Status: DC | PRN
  Filled 2012-09-14 (×2): qty 1

## 2012-09-14 MED ORDER — POLYETHYLENE GLYCOL 3350 17 G PO PACK
17.0000 g | PACK | Freq: Every day | ORAL | Status: DC
  Administered 2012-09-15 – 2012-09-24 (×10): 17 g via ORAL
  Filled 2012-09-14 (×11): qty 1

## 2012-09-14 MED ORDER — PANTOPRAZOLE SODIUM 40 MG PO TBEC
40.0000 mg | DELAYED_RELEASE_TABLET | Freq: Every day | ORAL | Status: DC
  Administered 2012-09-15 – 2012-09-24 (×10): 40 mg via ORAL
  Filled 2012-09-14 (×10): qty 1

## 2012-09-14 MED ORDER — HYDROCODONE-ACETAMINOPHEN 5-325 MG PO TABS: 1.0000 | ORAL_TABLET | Freq: Four times a day (QID) | ORAL | Status: DC | PRN

## 2012-09-14 MED ORDER — METHOCARBAMOL 500 MG PO TABS
500.0000 mg | ORAL_TABLET | Freq: Four times a day (QID) | ORAL | Status: DC | PRN
  Administered 2012-09-15 – 2012-09-23 (×6): 500 mg via ORAL
  Filled 2012-09-14 (×6): qty 1

## 2012-09-14 NOTE — Progress Notes (Signed)
Patient ID: Leslie Cox, male   DOB: 09/21/1945, 67 y.o.   MRN: [id number] 6 Days Post-Op  Subjective: Pt feels ok.  Passing flatus, but no BM.  Tolerating full liquids.  Voiding well.  Objective: Vital signs in last 24 hours: Temp:  [98.1 F (36.7 C)-98.7 F (37.1 C)] 98.7 F (37.1 C) (03/17 0507) Pulse Rate:  [105-116] 105 (03/17 0507) Resp:  [16-18] 18 (03/17 0507) BP: (155-173)/(71-86) 164/86 mmHg (03/17 0507) SpO2:  [91 %-93 %] 92 % (03/17 0507) Last BM Date: 09/13/12  Intake/Output from previous day: 03/16 0701 - 03/17 0700 In: 2490 [P.O.:840; I.V.:1650] Out: 4250 [Urine:4250] Intake/Output this shift: Total I/O In: -  Out: 550 [Urine:550]  PE: Heart: regular Lungs: CTAB Abd: soft, mild distention, +BS, NT  Lab Results:   Recent Labs  09/12/12 0700 09/14/12 0835  WBC 8.7 7.5  HGB 8.6* 9.3*  HCT 25.2* 27.1*  PLT 272 399   BMET  Recent Labs  09/13/12 0514 09/14/12 0710  NA 134* 132*  K 4.1 3.6  CL 98 97  CO2 27 29  GLUCOSE 90 118*  BUN 6 5*  CREATININE 0.47* 0.43*  CALCIUM 8.3* 8.3*   PT/INR  Recent Labs  09/12/12 0700 09/13/12 0514  LABPROT 16.4* 17.9*  INR 1.35 1.52*   CMP     Component Value Date/Time   NA 132* 09/14/2012 0710   K 3.6 09/14/2012 0710   CL 97 09/14/2012 0710   CO2 29 09/14/2012 0710   GLUCOSE 118* 09/14/2012 0710   BUN 5* 09/14/2012 0710   CREATININE 0.43* 09/14/2012 0710   CALCIUM 8.3* 09/14/2012 0710   PROT 5.9* 09/08/2012 0620   ALBUMIN 2.6* 09/08/2012 0620   AST 35 09/08/2012 0620   ALT 19 09/08/2012 0620   ALKPHOS 43 09/08/2012 0620   BILITOT 0.5 09/08/2012 0620   GFRNONAA >90 09/14/2012 0710   GFRAA >90 09/14/2012 0710   Lipase  No results found for this basename: lipase       Studies/Results: Dg Abd Portable 1v  09/12/2012  *RADIOLOGY REPORT*  Clinical Data: Ileus  PORTABLE ABDOMEN - 1 VIEW  Comparison: 09/08/2012  Findings: There is a slightly increased amount of gas in the colon but no dilatation.  Small  bowel gas pattern is normal.  Right-sided pelvic region drain has been removed.  IMPRESSION: Slightly increased amount of colonic gas, but not pronounced. Small bowel gas pattern is normal.   Original Report Authenticated By: Paulina Fusi, M.D.     Anti-infectives: Anti-infectives   Start     Dose/Rate Route Frequency Ordered Stop   09/09/12 0400  ceFAZolin (ANCEF) IVPB 1 g/50 mL premix     1 g 100 mL/hr over 30 Minutes Intravenous Every 8 hours 09/09/12 0158 09/09/12 2148   09/08/12 1200  [MAR Hold]  ceFAZolin (ANCEF) IVPB 2 g/50 mL premix     (On MAR Hold since 09/08/12 1406)   2 g 100 mL/hr over 30 Minutes Intravenous  Once 09/07/12 1117 09/08/12 1855       Assessment/Plan  1. Acetabular fracture, s/p repair 2. Post op ileus, resolving 3. Urinary retention, resolved  Plan: 1. Patient desire an enema to assist with having a BM.  Agree with slow advancement of diet. 2. No issues with voiding since foley removed. 3. Agree with transfer to rehab.   LOS: 9 days    Holle Sprick E 09/14/2012, 1:26 PM Pager: [phone]

## 2012-09-14 NOTE — Progress Notes (Signed)
Seen, agree with above. Pt with diarrhea today after enema.  Advance diet as tolerated. Minimal distention.

## 2012-09-14 NOTE — Progress Notes (Signed)
Rehab admissions - I have authorization for acute inpatient rehab admission for today.  I spoke with patient and he is agreeable to inpatient rehab admission.  Bed available and can admit today to inpatient rehab.  Call me for questions.  #[phone]

## 2012-09-14 NOTE — Progress Notes (Signed)
[facility] Progress Note  Subjective  Doing better No n/v Voiding on own Tolerating full liquid diet Notes constipation + Flatus Mobilizing  Ready for inpatient rehab Pain tolerable  Objective  BP 164/86  Pulse 105  Temp(Src) 98.7 F (37.1 C) (Oral)  Resp 18  Ht 5\' 7"  (1.702 m)  Wt 82.9 kg (182 lb 12.2 oz)  BMI 28.62 kg/m2  SpO2 92%  Patient Vitals for the past 24 hrs:  BP Temp Temp src Pulse Resp SpO2  09/14/12 0507 164/86 mmHg 98.7 F (37.1 C) Oral 105 18 92 %  09/13/12 2017 173/79 mmHg 98.1 F (36.7 C) Oral 116 18 91 %  09/13/12 1400 155/71 mmHg 98.7 F (37.1 C) Oral 112 16 93 %  09/13/12 1300 133/57 mmHg 98.4 F (36.9 C) Oral 115 - 95 %    Intake/Output     03/16 0701 - 03/17 0700 03/17 0701 - 03/18 0700   P.O. 840    I.V. (mL/kg) 1650 (19.9)    Other     Total Intake(mL/kg) 2490 (30)    Urine (mL/kg/hr) 4250 (2.1) 250 (1.9)   Emesis/NG output     Total Output 4250 250   Net -1760 -250        Urine Occurrence 1 x      Labs  Cbc pending  Results for Leslie Cox, Leslie Cox (MRN [id number]) as of 09/14/2012 08:39  Ref. Range 09/14/2012 07:10  Sodium Latest Range: 135-145 mEq/L 132 (L)  Potassium Latest Range: 3.5-5.1 mEq/L 3.6  Chloride Latest Range: 96-112 mEq/L 97  CO2 Latest Range: 19-32 mEq/L 29  BUN Latest Range: 6-23 mg/dL 5 (L)  Creatinine Latest Range: 0.50-1.35 mg/dL 4.09 (L)  Calcium Latest Range: 8.4-10.5 mg/dL 8.3 (L)  GFR calc non Af Amer Latest Range: >90 mL/min >90  GFR calc Af Amer Latest Range: >90 mL/min >90  Glucose Latest Range: 70-99 mg/dL 811 (H)    Exam  Gen:  Awake and alert, reading newspaper Lungs: clear bilaterally Cardiac: reg, s1 and s2 Abd: distended but softer, NT, + BS Pelvis:  Incisions stable, look good Ext:         Right Lower Extremity    Swelling stable  TED hose in place  Ext warm  + DP pulse  DPN, SPN, TN, LFCN sensation intact  EHL, FHL, AT, PT, peroneals, gastroc motor intact  No  DCT   Assessment and Plan  67 year old male status post ground level fall  1. Mechanical ground-level fall 2. Complex right both column acetabular fracture. OTA 62-C2 POD 6             TDWB R leg             No formal ROM restrictions, avoid extreme ranges             Ice and elevate               TED hose             PT/OT                         3. Hypertension             Stable continue to monitor 4. Pain             minimize narcs due to ileus  Prn norco  Scheduled ultram, titrate down while in CIR  Prn APAP  5. ileus             improving  Tolerated NGT, has been removed  + Flatus  Tolerating full liquids  Would keep on full liquids for another day then advance 6. FEN  Per #6  Hyponatremia- dec IVF, bmet in AM  Voiding very well, nurse reports 500 cc out since start of her shift (0700)  D/c flowmax, titrate urecholine down 7. GERD  protonix       8.  DVT/PE prophylaxis             lovenox x 4 weeks  9.  Metabolic bone disease             Vitamin D (25 OH), mildly decreased, supplement               10. Disposition             stable for inpatient rehab today   Mearl Latin, PA-C [facility] [phone] (P) 09/14/2012 8:38 AM

## 2012-09-14 NOTE — PMR Pre-admission (Signed)
PMR Admission Coordinator Pre-Admission Assessment  Patient: Leslie Cox is an 67 y.o., male MRN: [id number] DOB: 04-17-1946 Height: 5\' 7"  (170.2 cm) Weight: 82.9 kg (182 lb 12.2 oz)              Insurance Information HMO:      PPO: Yes     PCP:       IPA:       80/20:       OTHER: Group # [id number] PRIMARY: [employer] [place]      Policy#: [id number]      Subscriber: [name] CM Name: Victorio Palm      Phone#: [phone]     Fax#: [phone] Pre-Cert#: [id number]  Auth good for 03/17 - 09/22/12      Employer: Fulltime - [employer] Benefits:  Phone #: [phone]     Name: Rhae Lerner. Date: 07/01/12     Deduct: $1250(met $82.83)      Out of Pocket Max: $3500(met $82.83)      Life Max: None CIR: 80% w/auth      SNF: 80% w/auth Outpatient: 80%  60 visits combined     Co-Pay: 20% Home Health: 80% w/auth  100 visits per benefit period      Co-Pay: 20% DME: 80%     Co-Pay: 20% Providers: in Surveyor, quantity Information   Name Relation Home Work [place] Other [phone]       Current Medical History  Patient Admitting Diagnosis:  R acetabular fx  History of Present Illness: A 67 y.o. right-handed male admitted 09/05/2012 after mechanical fall. Patient reports slipping with point of impact being the right side of his body. Fall from about 3-4 feet onto concrete. There was no loss of consciousness.  X-rays and imaging revealed a right acetabulum fracture anterior column and posterior hemitransverse. Orthopedic services consulted patient placed entrance tibial traction and later underwent operative repair of right acetabular fracture/ORIF 09/08/2012 per Dr. Carola Frost. Touchdown weightbearing right lower extremity. Placed on Coumadin for DVT prophylaxis. Postoperative anemia with latest hemoglobin 9.7. Since rehab consult done 03/12, bouts of urinary retention, placed on urecholine, and monitored.  Developed ileus with NG tube placed and IV fluids for  hydration.  NG tube removed 03/16, placed on clear liquids and monitored for nausea.  Currently on full liquids 03/17 and tolerating well.  Currently requiring Mod assist for pivot transfers with therapy.  Physical therapy evaluation completed 09/09/2012 with recommendations of physical medicine rehabilitation consult to consider inpatient rehabilitation services.     Past Medical History  Past Medical History  Diagnosis Date  . Hypertension   . GERD (gastroesophageal reflux disease)     Family History  family history is not on file.  Prior Rehab/Hospitalizations:  None   Current Medications  Current facility-administered medications:0.9 %  sodium chloride infusion, , Intravenous, Continuous, Mearl Latin, PA-C, Last Rate: 75 mL/hr at 09/14/12 0600;  acetaminophen (TYLENOL) tablet 325-650 mg, 325-650 mg, Oral, Q6H PRN, Mearl Latin, PA-C;  alum & mag hydroxide-simeth (MAALOX/MYLANTA) 200-200-20 MG/5ML suspension 30 mL, 30 mL, Oral, Q4H PRN, Loreta Ave, MD, 30 mL at 09/10/12 0834 antiseptic oral rinse (BIOTENE) solution 15 mL, 15 mL, Mouth Rinse, BID, Budd Palmer, MD, 15 mL at 09/14/12 0800;  bethanechol (URECHOLINE) tablet 25 mg, 25 mg, Oral, QID, Freeman Caldron, PA-C, 25 mg at 09/14/12 1018;  bisacodyl (DULCOLAX) suppository 10 mg, 10 mg, Rectal, BID, Kirstin J Shepperson, PA-C, 10 mg at  09/14/12 0615 chlorproMAZINE (THORAZINE) 25 mg in sodium chloride 0.9 % 25 mL IVPB, 25 mg, Intravenous, Q6H PRN, Freeman Caldron, PA-C;  docusate sodium (COLACE) capsule 100 mg, 100 mg, Oral, BID, Freeman Caldron, PA-C, 100 mg at 09/14/12 1018;  enoxaparin (LOVENOX) injection 40 mg, 40 mg, Subcutaneous, Q24H, Freeman Caldron, PA-C, 40 mg at 09/13/12 1204 ferrous sulfate 300 (60 FE) MG/5ML syrup 300 mg, 300 mg, Oral, TID WC, Freeman Caldron, PA-C, 300 mg at 09/14/12 1610;  HYDROcodone-acetaminophen (NORCO/VICODIN) 5-325 MG per tablet 1-2 tablet, 1-2 tablet, Oral, Q6H PRN, Mearl Latin,  PA-C;  lidocaine (XYLOCAINE) 2 % jelly, , Urethral, PRN, Freeman Caldron, PA-C;  menthol-cetylpyridinium (CEPACOL) lozenge 3 mg, 1 lozenge, Oral, PRN, Freeman Caldron, PA-C methocarbamol (ROBAXIN) 1,000 mg in dextrose 5 % 50 mL IVPB, 1,000 mg, Intravenous, Q6H, Mearl Latin, PA-C, 1,000 mg at 09/12/12 1611;  methocarbamol (ROBAXIN) tablet 1,000 mg, 1,000 mg, Oral, Q6H, Mearl Latin, PA-C, 1,000 mg at 09/14/12 9604;  metoCLOPramide (REGLAN) tablet 10 mg, 10 mg, Oral, TID Corene Cornea, PA-C, 10 mg at 09/14/12 5409 morphine 2 MG/ML injection 1-2 mg, 1-2 mg, Intravenous, Q4H PRN, Mearl Latin, PA-C, 2 mg at 09/12/12 8119;  ondansetron [facility]) injection 4 mg, 4 mg, Intravenous, Q6H PRN, Mearl Latin, PA-C, 4 mg at 09/10/12 2123;  ondansetron [facility] tablet 4 mg, 4 mg, Oral, Q6H PRN, Mearl Latin, PA-C;  pantoprazole (PROTONIX) EC tablet 40 mg, 40 mg, Oral, Daily, Budd Palmer, MD, 40 mg at 09/14/12 1018 phenol (CHLORASEPTIC) mouth spray 1 spray, 1 spray, Mouth/Throat, PRN, Budd Palmer, MD, 1 spray at 09/12/12 1255;  polyethylene glycol (MIRALAX / GLYCOLAX) packet 17 g, 17 g, Oral, Daily, Mearl Latin, PA-C, 17 g at 09/14/12 1018;  sorbitol 70 % solution 30 mL, 30 mL, Oral, Daily PRN, Mearl Latin, PA-C, 30 mL at 09/10/12 1478;  traMADol (ULTRAM) tablet 100 mg, 100 mg, Oral, Q6H, Freeman Caldron, PA-C, 100 mg at 09/14/12 2956  Patients Current Diet: Full Liquid  Precautions / Restrictions Precautions Precautions: Fall Restrictions Weight Bearing Restrictions: Yes RLE Weight Bearing: Touchdown weight bearing   Prior Activity Level Community (5-7x/wk): Went out daily.  Worked 2 jobs.  food [employer] and [employer].  Home Assistive Devices / Equipment Home Assistive Devices/Equipment: None Home Adaptive Equipment: None  Prior Functional Level Prior Function Level of Independence: Independent Able to Take Stairs?: Yes Driving: Yes Vocation: Full time employment Comments:  multiple Conservation officer, nature jobs  Current Functional Level Cognition  Arousal/Alertness: Lethargic (drowsy) Overall Cognitive Status: Impaired Orientation Level: Oriented X4 Following Commands: Follows multi-step commands with increased time Cognition - Other Comments: Seems impaired due to meds    Extremity Assessment (includes Sensation/Coordination)  RUE ROM/Strength/Tone: WFL for tasks assessed  RLE ROM/Strength/Tone: Deficits;Due to pain RLE ROM/Strength/Tone Deficits: ankle WFL AROM, during mobility hip flexion to about 75 degrees sitting; knee flexion to about 80 degrees    ADLs  Eating/Feeding: Performed;Set up Grooming: Performed;Wash/dry hands;Wash/dry face;Brushing hair;Supervision/safety;Set up Where Assessed - Grooming: Unsupported sitting Upper Body Bathing: Simulated;Supervision/safety;Set up Lower Body Bathing: +1 Total assistance Upper Body Dressing: Performed;Supervision/safety;Set up;Minimal assistance;Other (comment) (min A due to IV lines) Lower Body Dressing: +1 Total assistance Toilet Transfer: Simulated;+2 Total assistance Toilet Transfer Method: Stand pivot Toileting - Clothing Manipulation and Hygiene: +1 Total assistance Where Assessed - Toileting Clothing Manipulation and Hygiene: Standing Transfers/Ambulation Related to ADLs: pt required verbal cues for correct hand  placementand for TDWB    Mobility  Bed Mobility: Supine to Sit;Sitting - Scoot to Edge of Bed Supine to Sit: 1: +2 Total assist;HOB elevated;With rails Supine to Sit: Patient Percentage: 60% Sitting - Scoot to Edge of Bed: 3: Mod assist Sit to Supine: 1: +2 Total assist Sit to Supine: Patient Percentage: 30%    Transfers  Transfers: Sit to Stand;Stand to Sit;Stand Pivot Transfers Sit to Stand: 1: +2 Total assist;With upper extremity assist;From bed;From chair/3-in-1;With armrests Sit to Stand: Patient Percentage: 60% Stand to Sit: 3: Mod assist;With upper extremity assist;With armrests;To  chair/3-in-1 Stand to Sit: Patient Percentage: 50% Stand Pivot Transfers: 3: Mod assist Stand Pivot Transfers: Patient Percentage: 50%    Ambulation / Gait / Stairs / Wheelchair Mobility  Ambulation/Gait Ambulation/Gait Assistance: 1: +2 Total assist Ambulation/Gait: Patient Percentage: 60% Ambulation Distance (Feet): 8 Feet Assistive device: Rolling walker Ambulation/Gait Assistance Details: Cues for safe RW management/advancement, sequencing with TDWing R LE, & reinforcement of WBing restriction.    Gait Pattern: Step-to pattern    Posture / Balance Static Sitting Balance Static Sitting - Balance Support: Feet supported Static Sitting - Level of Assistance: 5: Stand by assistance Static Sitting - Comment/# of Minutes: sat EOB for ~5 minutes with cues for posture    Special needs/care consideration BiPAP/CPAP No CPM No Continuous Drip IV No Dialysis No        Life Vest No Oxygen Yes on O2 [place] Special Bed No Trach Size NO Wound Vac (area) No     Skin Has blister areas on right thigh, dressing in place.                             Bowel mgmt: No BM since admission Bladder mgmt: Voiding in urianl Diabetic mgmt No    Previous Home Environment Living Arrangements: Spouse/significant other Lives With: Spouse Available Help at Discharge: Available 24 hours/day Type of Home: House Home Layout: One level Home Access: Stairs to enter Entrance Stairs-Rails: None Entrance Stairs-Number of Steps: 1 Bathroom Shower/Tub: Engineer, manufacturing systems: Handicapped height Home Care Services: No  Discharge Living Setting Plans for Discharge Living Setting: Patient's home;Lives with (comment);Other (Comment) (Lives in a condominium.) Type of Home at Discharge: House (Condominium) Discharge Home Layout: One level Discharge Home Access: Level entry (Has a small 1/2 step at entry.) Do you have any problems obtaining your medications?: No  Social/Family/Support Systems Patient  Roles: Spouse;Parent (Has 1 son.) Contact Information: Othar Curto - wife Anticipated Caregiver: wife Anticipated Caregiver's Contact Information: Ollen Gross [phone] Ability/Limitations of Caregiver: Wife retired and not working, can Haematologist. Medical laboratory scientific officer: 24/7 Discharge Plan Discussed with Primary Caregiver: Yes Is Caregiver In Agreement with Plan?: Yes Does Caregiver/Family have Issues with Lodging/Transportation while Pt is in Rehab?: No  Goals/Additional Needs Patient/Family Goal for Rehab: PT/OT mod I to min A, no ST needs Expected length of stay: 2 weeks Cultural Considerations: Catholic Dietary Needs: Full liquids Equipment Needs: TBD Pt/Family Agrees to Admission and willing to participate: Yes Program Orientation Provided & Reviewed with Pt/Caregiver Including Roles  & Responsibilities: Yes  Decrease burden of Care through IP rehab admission:   Not applicable  Possible need for SNF placement upon discharge: Not likely  Patient Condition:   This patient's medical and functional status has changed since the consult dated 09/09/12.  Currently is at a mod assist level for pivotal transfers.  See history of present illness above for medical update.  After speaking with the rehab MD and the acute team, we feel that he is appropriate for inpatient rehab. Will admit to inpatient rehab today.  Preadmission Screen Completed By:  Trish Mage, 09/14/2012 11:33 AM ______________________________________________________________________   Discussed status with Dr. Riley Kill on 09/14/12 at 1200 and received telephone approval for admission today.  Admission Coordinator:  Trish Mage, time1201/Date03/17/14

## 2012-09-14 NOTE — Plan of Care (Signed)
Overall Plan of Care [facility]) Patient Details Name: Leslie Cox MRN: [id number] DOB: 1945/09/29  Diagnosis:  Complex right acetabular fx  Co-morbidities: pain mgt, anxiety, wound care  Functional Problem List  Patient demonstrates impairments in the following areas: Balance, Bladder, Bowel, Cognition, Edema, Endurance, Medication Management, Pain, Safety, Sensory  and Skin Integrity  Basic ADL's: grooming, bathing, dressing and toileting Advanced ADL's: simple meal preparation  Transfers:  bed mobility, bed to chair, toilet, tub/shower, car and furniture Locomotion:  ambulation, wheelchair mobility and stairs  Additional Impairments:  Leisure Awareness  Anticipated Outcomes Item Anticipated Outcome  Eating/Swallowing  independent  Basic self-care  Supervision -> mod I  Tolieting  Mod I  Bowel/Bladder  Continent with toileting modified independence  Transfers  Mod I basic; S for car  Locomotion  Mod I w/c mobility; S gait and 1 step for home entry  Communication    Cognition    Pain  3 or less on scale of 1-10  Safety/Judgment  supervision  Other     Therapy Plan: PT Intensity: Minimum of 1-2 x/day ,45 to 90 minutes PT Frequency: 5 out of 7 days PT Duration Estimated Length of Stay: ~ 10 days OT Intensity: Minimum of 1-2 x/day, 45 to 90 minutes OT Frequency: 5 out of 7 days OT Duration/Estimated Length of Stay: 10-12 days      Team Interventions: Item RN PT OT SLP SW TR Other  Self Care/Advanced ADL Retraining  x x      Neuromuscular Re-Education  x x      Therapeutic Activities  x x      UE/LE Strength Training/ROM  x x      UE/LE Coordination Activities  x x      Visual/Perceptual Remediation/Compensation         DME/Adaptive Equipment Instruction  x x      Therapeutic Exercise  x x      Balance/Vestibular Training  x x      Patient/Family Education x x x      Cognitive Remediation/Compensation  x       Functional Mobility Training  x x       Ambulation/Gait Training  x x      Stair Training  x       Wheelchair Propulsion/Positioning  x x      Functional Tourist information centre manager Reintegration  x x      Dysphagia/Aspiration Film/video editor         Bladder Management x        Bowel Management x        Disease Management/Prevention x        Pain Management x x x      Medication Management x        Skin Care/Wound Management x x x      Splinting/Orthotics  x x      Discharge Planning x x x      Psychosocial Support x x x                             Team Discharge Planning: Destination: PT- (sister in law's house) ,OT- Home (patient states sister-in-laws house) , SLP-  Projected Follow-up: PT-Other (comment) (HHPT v OPPT), OT-  Other (comment) (OT: TBD), SLP-  Projected Equipment Needs: PT-Rolling walker with 5" wheels;Wheelchair (measurements);Wheelchair cushion (  measurements), OT- Tub/shower seat;Tub/shower bench, SLP-  Patient/family involved in discharge planning: PT- Patient,  OT-Patient, SLP-   MD ELOS: 10 days Medical Rehab Prognosis:  Excellent Assessment: The patient has been admitted for CIR therapies. The team will be addressing, functional mobility, strength, stamina, balance, safety, adaptive techniques/equipment, self-care, bowel and bladder mgt, patient and caregiver education, pain mgt, ortho precautions, . Goals have been set at mod I to supervision.      See Team Conference Notes for weekly updates to the plan of care

## 2012-09-14 NOTE — Progress Notes (Signed)
Report called to michelle on 4000. Patient is packed up and ready for transfer.

## 2012-09-14 NOTE — Progress Notes (Signed)
Physical Therapy Treatment Patient Details Name: Demere Dotzler MRN: [id number] DOB: 1945/09/02 Today's Date: 09/14/2012 Time: 4098-1191 PT Time Calculation (min): 31 min  PT Assessment / Plan / Recommendation Comments on Treatment Session  Patient able to progress with ambulation. Great CIR candidate.. Awaiting admission today    Follow Up Recommendations  CIR     Does the patient have the potential to tolerate intense rehabilitation     Barriers to Discharge        Equipment Recommendations       Recommendations for Other Services    Frequency Min 5X/week   Plan Discharge plan remains appropriate;Frequency remains appropriate    Precautions / Restrictions Precautions Precautions: Fall Restrictions RLE Weight Bearing: Touchdown weight bearing   Pertinent Vitals/Pain     Mobility  Bed Mobility Supine to Sit: 3: Mod assist;With rails Sitting - Scoot to Edge of Bed: 3: Mod assist Details for Bed Mobility Assistance: Cues for technique.  (A) for R LE management & to lift shoulders/trunk to sitting upright with use of draw pad to pivot hips around to EOB Transfers Sit to Stand: 3: Mod assist;From bed;With upper extremity assist Stand to Sit: 4: Min assist;To bed;Without upper extremity assist Details for Transfer Assistance: Cues for hand placement & R LE positioning to maintain WBing restriction.   (A) to achieve standing, balance, RW management during pivot, & controlled descent.   Ambulation/Gait Ambulation/Gait Assistance: 4: Min assist Ambulation Distance (Feet): 30 Feet Assistive device: Rolling walker Ambulation/Gait Assistance Details: Cues for all aspects of ambulation and TWB technique Gait Pattern: Step-to pattern    Exercises     PT Diagnosis:    PT Problem List:   PT Treatment Interventions:     PT Goals    Visit Information  Last PT Received On: 09/14/12 Assistance Needed: +1    Subjective Data      Cognition  Cognition Overall Cognitive  Status: Appears within functional limits for tasks assessed/performed Arousal/Alertness: Awake/alert Orientation Level: Appears intact for tasks assessed Behavior During Session: [facility] for tasks performed    Balance     End of Session PT - End of Session Equipment Utilized During Treatment: Gait belt Activity Tolerance: Patient tolerated treatment well Patient left: in chair;with call bell/phone within reach;with family/visitor present Nurse Communication: Mobility status   GP     Fredrich Birks 09/14/2012, 11:58 AM  09/14/2012 Fredrich Birks PTA [phone] pager [phone] office

## 2012-09-14 NOTE — Consult Note (Signed)
I have seen and examined the patient. I agree with the above plan for management.  Myrene Galas, MD [facility] [phone] (p)

## 2012-09-14 NOTE — H&P (Signed)
Physical Medicine and Rehabilitation Admission H&P  Chief Complaint   Patient presents with   .  Fall   .  Hip Pain   :  HPI: Leslie Cox is a 67 y.o. right-handed male admitted 09/05/2012 after mechanical fall. Patient reports slipping with point of impact being the right side of his body. Fall from about 3-4 feet onto concrete. There was no loss of consciousness.  X-rays and imaging revealed a right acetabulum fracture anterior column and posterior hemitransverse. Orthopedic services consulted trans-tibial traction pin placed placed and later underwent operative repair of right acetabular fracture/ORIF 09/08/2012 per Dr. Carola Frost. Touchdown weightbearing right lower extremity. Placed on subcutaneous Lovenox for DVT prophylaxis. Patient initially been placed on Coumadin for DVT prophylaxis discontinued due to super therapeutic levels and advised to continue only Lovenox for now. Postoperative anemia with latest hemoglobin 8.6. Noted bouts of urinary retention placed on Urecholine and monitored. Patient developed ileus with nasogastric tube placed as well as intravenous fluids for hydration. His nasogastric tube was removed 09/13/2012 placed on clear liquid diet and monitor for increased nausea. Latest abdominal film 09/12/2012 shows small bowel gas pattern to be normal. Physical therapy evaluation completed 09/09/2012 with recommendations of physical medicine rehabilitation consult to consider inpatient rehabilitation services. Patient was felt to be a candidate for inpatient rehabilitation services and was admitted for comprehensive rehabilitation program  Review of Systems  Gastrointestinal: Positive for constipation.  Musculoskeletal: Positive for myalgias.  All other systems reviewed and are negative  Past Medical History   Diagnosis  Date   .  Hypertension     Past Surgical History   Procedure  Laterality  Date   .  Tonsillectomy     .  External fixation leg  Right  09/05/2012     Procedure:  Application of traction bow externally; Surgeon: Shelda Pal, MD; Location: [facility] OR; Service: Orthopedics; Laterality: Right;   .  Orif acetabular fracture  Right  09/08/2012     Procedure: OPEN REDUCTION INTERNAL FIXATION (ORIF) ACETABULAR FRACTURE; Surgeon: Budd Palmer, MD; Location: [facility] OR; Service: Orthopedics; Laterality: Right;    No family history on file.  Social History: reports that he has never smoked. He has never used smokeless tobacco. He reports that he does not drink alcohol or use illicit drugs.  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, nature jobs  Functional Status:  Mobility:  Bed Mobility  Bed Mobility: Supine to Sit;Sitting - Scoot to Edge of Bed  Supine to Sit: 1: +2 Total assist;HOB elevated;With rails  Supine to Sit: Patient Percentage: 60%  Sitting - Scoot to Edge of Bed: 3: Mod assist  Sit to Supine: 1: +2 Total assist  Sit to Supine: Patient Percentage: 30%  Transfers  Transfers: Sit to Stand;Stand to Sit;Stand Pivot Transfers  Sit to Stand: 1: +2 Total assist;With upper extremity assist;From bed;From chair/3-in-1;With armrests  Sit to Stand: Patient Percentage: 60%  Stand to Sit: 3: Mod assist;With upper extremity assist;With armrests;To chair/3-in-1  Stand to  Sit: Patient Percentage: 50%  Stand Pivot Transfers: 3: Mod assist  Stand Pivot Transfers: Patient Percentage: 50%  Ambulation/Gait  Ambulation/Gait Assistance: 1: +2 Total assist   Ambulation/Gait: Patient Percentage: 60%  Ambulation Distance (Feet): 8 Feet  Assistive device: Rolling walker  Ambulation/Gait Assistance Details: Cues for safe RW management/advancement, sequencing with TDWing R LE, & reinforcement of WBing restriction.  Gait Pattern: Step-to pattern   ADL:  ADL  Eating/Feeding: Performed;Set up  Grooming: Performed;Wash/dry hands;Wash/dry face;Brushing hair;Supervision/safety;Set up  Where Assessed - Grooming: Unsupported sitting  Upper Body Bathing: Simulated;Supervision/safety;Set up  Lower Body Bathing: +1 Total assistance  Upper Body Dressing: Performed;Supervision/safety;Set up;Minimal assistance;Other (comment) (min A due to IV lines)  Lower Body Dressing: +1 Total assistance  Toilet Transfer: Simulated;+2 Total assistance  Toilet Transfer Method: Stand pivot  Transfers/Ambulation Related to ADLs: pt required verbal cues for correct hand placementand for TDWB  Cognition:  Cognition  Arousal/Alertness: Lethargic (drowsy)  Orientation Level: Oriented X4  Cognition  Overall Cognitive Status: Impaired  Area of Impairment: Problem solving;Following commands  Arousal/Alertness: Lethargic (drowsy)  Orientation Level: Appears intact for tasks assessed  Behavior During Session: (drowsy)  Following Commands: Follows multi-step commands with increased time  Problem Solving: slow to process & required max directional cues for tasks  Cognition - Other Comments: Seems impaired due to meds   Physical Exam:  Blood pressure 164/86, pulse 105, temperature 98.7 F (37.1 C), temperature source Oral, resp. rate 18, height 5\' 7"  (1.702 m), weight 82.9 kg (182 lb 12.2 oz), SpO2 92.00%.   Vitals reviewed.  HENT: oral mucosa pink and moist Head: Normocephalic.  Eyes: EOM are normal.  Neck: Neck supple. No thyromegaly present.  Cardiovascular: Normal rate and regular rhythm. No murmurs or gallops Pulmonary/Chest: Effort normal and breath sounds normal. No  respiratory distress.  Abdominal: Nontender. Bowel sounds are distant. Abdomen is mildly distended still. Neurological:  Patient is less anxious. More cooperative. Participated with all parts of exam.  Patient was alert and oriented x3. He can follow 3 step commands. UE's grossly 4/5. RLE 2/5 proximal to 4/5 distal. LLE 3/5 prox to 4/5 distally. No sensory or CN deficits.  Skin:  Surgical sites dressed with minimal drainage.  Results for orders placed during the hospital encounter of 09/05/12 (from the past 48 hour(s))   PROTIME-INR Status: Abnormal    Collection Time    09/13/12 5:14 AM   Result  Value  Range    Prothrombin Time  17.9 (*)  11.6 - 15.2 seconds    INR  1.52 (*)  0.00 - 1.49   BASIC METABOLIC PANEL Status: Abnormal    Collection Time    09/13/12 5:14 AM   Result  Value  Range    Sodium  134 (*)  135 - 145 mEq/L    Potassium  4.1  3.5 - 5.1 mEq/L    Chloride  98  96 - 112 mEq/L    CO2  27  19 - 32 mEq/L    Glucose, Bld  90  70 - 99 mg/dL    BUN  6  6 - 23 mg/dL    Creatinine, Ser  1.61 (*)  0.50 - 1.35 mg/dL    Calcium  8.3 (*)  8.4 - 10.5 mg/dL    GFR calc non Af Amer  >90  >90 mL/min    GFR calc Af Amer  >90  >90 mL/min    Comment:      The eGFR has been calculated  using the CKD EPI equation.     This calculation has not been     validated in all clinical     situations.     eGFR's persistently     <90 mL/min signify     possible Chronic Kidney Disease.    Dg Abd Portable 1v  09/12/2012 *RADIOLOGY REPORT* Clinical Data: Ileus PORTABLE ABDOMEN - 1 VIEW Comparison: 09/08/2012 Findings: There is a slightly increased amount of gas in the colon but no dilatation. Small bowel gas pattern is normal. Right-sided pelvic region drain has been removed. IMPRESSION: Slightly increased amount of colonic gas, but not pronounced. Small bowel gas pattern is normal. Original Report Authenticated By: Paulina Fusi, M.D.   Post Admission Physician Evaluation:  1. Functional  deficits secondary to complex acetabular fx s/p ORIF. 2. Patient is admitted to receive collaborative, interdisciplinary care between the physiatrist, rehab nursing staff, and therapy team. 3. Patient's level of medical complexity and substantial therapy needs in context of that medical necessity cannot be provided at a lesser intensity of care such as a SNF. 4. Patient has experienced substantial functional loss from his/her baseline which was documented above under the "Functional History" and "Functional Status" headings. Judging by the patient's diagnosis, physical exam, and functional history, the patient has potential for functional progress which will result in measurable gains while on inpatient rehab. These gains will be of substantial and practical use upon discharge in facilitating mobility and self-care at the household level. 5. Physiatrist will provide 24 hour management of medical needs as well as oversight of the therapy plan/treatment and provide guidance as appropriate regarding the interaction of the two. 6. 24 hour rehab nursing will assist with bladder management, bowel management, safety, skin/wound care, disease management, medication administration, pain management and patient education and help integrate therapy concepts, techniques,education, etc. 7. PT will assess and treat for/with: Lower extremity strength, range of motion, stamina, balance, functional mobility, safety, adaptive techniques and equipment, pain mgt, ortho precautions. Goals are: mod I to supervision. 8. OT will assess and treat for/with: ADL's, functional mobility, safety, upper extremity strength, adaptive techniques and equipment, pain mgt, ortho precautions. Goals are: mod I to min assist. 9. SLP will assess and treat for/with: n/a. Goals are: n/a. 10. Case Management and Social Worker will assess and treat for psychological issues and discharge planning. 11. Team conference will be held weekly to assess  progress toward goals and to determine barriers to discharge. 12. Patient will receive at least 3 hours of therapy per day at least 5 days per week. 13. ELOS: 10 days Prognosis: excellent Medical Problem List and Plan:  1. right acetabular fracture. Status post ORIF 09/08/2012  2. DVT Prophylaxis/Anticoagulation: Subcutaneous Lovenox. Check Doppler studies upon admit.  3. Pain Management: Hydrocodone/Robaxin as needed. Monitor with increased activity  4. Neuropsych: This patient is capable of making decisions on his/her own behalf.  5. Postoperative anemia. Continue iron supplement. Followup CBC  6. Postoperative ileus. Advance diet as tolerated. Continue Reglan 3 times a day. Abdomen still a little distended. Having mostly gas at this point. 7. Urinary retention. Urecholine 25 mg 4 times a day. Check PVRs x3. Urine study 09/11/2012 negative  Ranelle Oyster, MD, [place] [facility]   09/14/2012

## 2012-09-14 NOTE — Progress Notes (Signed)
Leslie Love, Pa notified of BP 185/90 manual check  left arm. This is a recheck from 1600 on arrival to floor for admission blood pressure 168/92 left arm manually. No new orders given at this time. Roberts-VonC[place], Dalisha Shively Elon Jester

## 2012-09-14 NOTE — Discharge Summary (Signed)
Orthopaedic Trauma Service (OTS)  Patient ID: Leslie Cox MRN: [id number] DOB/AGE: 1946/06/12 67 y.o.  Admit date: 09/05/2012 Discharge date: 09/14/2012  Admission Diagnoses: R anterior column, posterior hemitransverse acetabulum fracture with protrusion of femoral head HTN GERD  Discharge Diagnoses:  Principal Problem:   Right acetabular fracture with protrusio of the femoral head Active Problems:   Acid reflux   Ileus, postoperative   Scrotal edema   Acute urinary retention most likely secondary to scrotal edema and pelvic trauma   Hyponatremia   Unspecified vitamin D deficiency   Procedures Performed: 09/06/2012- Dr. Charlann Boxer  1. Placement of a right transtibial pin for application of  skeletal traction  09/08/2012- Dr. Carola Frost  1. Operative repair of the right acetabular fracture involving  both columns in a anterior column and posterior hemitransverse pattern  using the Stoppa approach with the superior window of the ilioinguinal  approach.   Discharged Condition: stable, to inpatient rehab  Hospital Course:   The patient is a 67 year old Caucasian male who sustained a ground-level fall on 3/8/ 2014 when he slipped on some ice in a parking lot near his condo. Patient landed on his right hip resulting in a complex right acetabular fracture. The patient was able to get up and get into his condo with the assistance of bystanders. EMS was contacted and subsequently brought the patient to [facility]. Upon arrival to the hospital a complete workup was performed which demonstrated an isolated right acetabular fracture with protrusion of the femoral head. Dr. Charlann Boxer orthopedics was contacted regarding the injury. As this is an acetabular injury the orthopedic trauma service was contacted and relatively early in the care and we discussed temporizing treatment. The patient was transferred to [place] to the operating room where placement of a skeletal traction pin was  placed in the proximal tibia. 20 pounds of skeletal traction was applied as a temporizing measure. the patient was seen by the orthopedic trauma service on 09/07/2012. We discussed operative and nonoperative management with the patient indicating that his best chance of full recovery was with operative treatment. The patient and family agreed to this and surgery was performed on 09/08/2012. Surgery was technically successful. On postoperative day 1 patient was having an significant amount of pain but denied any numbness or tingling or other disturbances. Pain is controlled with IV pain medications. Of note due to the length of this procedure as well as blood loss encountered patient was transferred to the step down unit postoperatively for close monitoring. On postoperative day #1 he was felt to be doing well and transferred to the orthopedic floor for continued observation, pain control and to begin therapies. In addition we were concerned for the development of postoperative ileus given the injury pattern, surgery, immobility, narcotic use. We did have patient started on Reglan scheduled postoperatively as well to increase GI motility and hopefully decrease the chances of postoperative ileus. On postoperative day #2 patient was doing fair. His pain was better well-controlled with oral pain medications. He did remain on a clear diet since postop day 1. He did complain of soreness to his right hip but was otherwise doing well. Later on the afternoon of postoperative day #2 was contacted by the nursing staff indicating that the patient was having some coughing. He was noted on rounds that morning his abdomen was slightly distended with decreased bowel sounds. I given these clinical findings with acute onset of vomiting as well as the procedure performed we did  decide to have an NG tube placed to provide comfort. The patient did tolerate this well and was and also exhibiting some urinary retention. The following day  on postoperative day #3 the patient was doing fair and as such a trauma consult was obtained to assist with management of his postoperative ileus. In addition patient had a dramatic spike high and his INR on postoperative day #3 where he jumped from 1.65 to 7.66. A subsequent INR was obtained which was 7.82. Coumadin was stopped and patient was given vitamin K. The patient continued to progress well over the next several days. Making gradual improvements. On 09/12/2012 his NG tube was removed. He also had a voiding trial on 09/13/2012 and this was successful. On 09/14/2012 patient was doing much better. He was tolerating a full liquid diet. He was on Lovenox for DVT and PE prophylaxis, he was and feeling much better in general. As such she was deemed stable for discharge to inpatient rehabilitation.  Consults: Trauma, CIR  Significant Diagnostic Studies:   Labs  Results for Leslie, Cox (MRN [id number]) as of 09/14/2012 08:39  Ref. Range 09/08/2012 06:20  Vit D, 25-Hydroxy Latest Range: 30-89 ng/mL 26 (L)  Vitamin D 1, 25 (OH) Total Latest Range: 18-72 pg/mL 49  Vitamin D2 1, 25 (OH) No range found <8  Vitamin D3 1, 25 (OH) No range found 49   Results for Leslie, Cox (MRN [id number]) as of 09/14/2012 08:39  Ref. Range 09/14/2012 07:10  Sodium Latest Range: 135-145 mEq/L 132 (L)  Potassium Latest Range: 3.5-5.1 mEq/L 3.6  Chloride Latest Range: 96-112 mEq/L 97  CO2 Latest Range: 19-32 mEq/L 29  BUN Latest Range: 6-23 mg/dL 5 (L)  Creatinine Latest Range: 0.50-1.35 mg/dL 7.42 (L)  Calcium Latest Range: 8.4-10.5 mg/dL 8.3 (L)  GFR calc non Af Amer Latest Range: >90 mL/min >90  GFR calc Af Amer Latest Range: >90 mL/min >90  Glucose Latest Range: 70-99 mg/dL 595 (H)   Results for Leslie, Cox (MRN [id number]) as of 09/14/2012 08:39  Ref. Range 09/13/2012 05:14  Prothrombin Time Latest Range: 11.6-15.2 seconds 17.9 (H)  INR Latest Range: 0.00-1.49  1.52 (H)    Results for Leslie, Cox (MRN  [id number]) as of 09/14/2012 08:39  Ref. Range 09/12/2012 07:00  WBC Latest Range: 4.0-10.5 K/uL 8.7  RBC Latest Range: 4.22-5.81 MIL/uL 2.82 (L)  Hemoglobin Latest Range: 13.0-17.0 g/dL 8.6 (L)  HCT Latest Range: 39.0-52.0 % 25.2 (L)  MCV Latest Range: 78.0-100.0 fL 89.4  MCH Latest Range: 26.0-34.0 pg 30.5  MCHC Latest Range: 30.0-36.0 g/dL 60.6  RDW Latest Range: 11.5-15.5 % 13.1  Platelets Latest Range: 150-400 K/uL 272   Treatments: IV hydration, antibiotics: Ancef, analgesia: acetaminophen, Morphine, Norco, Ultram, anticoagulation: warfarin (stopped due to supratherapeutic INR), changed to lovenox, therapies: PT, OT and RN and surgery: as above  Discharge Exam:   Orthopaedic Trauma Service Progress Note  Subjective  Doing better No n/v Voiding on own Tolerating full liquid diet Notes constipation + Flatus Mobilizing   Ready for inpatient rehab Pain tolerable  Objective  BP 164/86  Pulse 105  Temp(Src) 98.7 F (37.1 C) (Oral)  Resp 18  Ht 5\' 7"  (1.702 m)  Wt 82.9 kg (182 lb 12.2 oz)  BMI 28.62 kg/m2  SpO2 92%  Patient Vitals for the past 24 hrs:   BP  Temp  Temp src  Pulse  Resp  SpO2   09/14/12 0507  164/86 mmHg  98.7 F (37.1 C)  Oral  105  18  92 %   09/13/12 2017  173/79 mmHg  98.1 F (36.7 C)  Oral  116  18  91 %   09/13/12 1400  155/71 mmHg  98.7 F (37.1 C)  Oral  112  16  93 %   09/13/12 1300  133/57 mmHg  98.4 F (36.9 C)  Oral  115  -  95 %     Intake/Output     03/16 0701 - 03/17 0700 03/17 0701 - 03/18 0700    P.O. 840     I.V. (mL/kg) 1650 (19.9)     Other      Total Intake(mL/kg) 2490 (30)     Urine (mL/kg/hr) 4250 (2.1) 250 (1.9)    Emesis/NG output      Total Output 4250 250    Net -1760 -250          Urine Occurrence 1 x       Labs  Cbc pending  Results for PATRIK, TURNBAUGH (MRN [id number]) as of 09/14/2012 08:39   Ref. Range  09/14/2012 07:10   Sodium  Latest Range: 135-145 mEq/L  132 (L)   Potassium  Latest Range: 3.5-5.1 mEq/L   3.6   Chloride  Latest Range: 96-112 mEq/L  97   CO2  Latest Range: 19-32 mEq/L  29   BUN  Latest Range: 6-23 mg/dL  5 (L)   Creatinine  Latest Range: 0.50-1.35 mg/dL  4.09 (L)   Calcium  Latest Range: 8.4-10.5 mg/dL  8.3 (L)   GFR calc non Af Amer  Latest Range: >90 mL/min  >90   GFR calc Af Amer  Latest Range: >90 mL/min  >90   Glucose  Latest Range: 70-99 mg/dL  811 (H)     Exam  Gen:  Awake and alert, reading newspaper Lungs: clear bilaterally Cardiac: reg, s1 and s2 Abd: distended but softer, NT, + BS Pelvis:  Incisions stable, look good Ext:         Right Lower Extremity               Swelling stable             TED hose in place             Ext warm             + DP pulse             DPN, SPN, TN, LFCN sensation intact             EHL, FHL, AT, PT, peroneals, gastroc motor intact             No DCT   Assessment and Plan  67 year old male status post ground level fall  1. Mechanical ground-level fall 2. Complex right both column acetabular fracture. OTA 62-C2 POD 6             TDWB R leg             No formal ROM restrictions, avoid extreme ranges             Ice and elevate               TED hose             PT/OT                         3. Hypertension  Stable continue to monitor 4. Pain             minimize narcs due to ileus             Prn norco             Scheduled ultram, titrate down while in CIR             Prn APAP           5. ileus             improving             Tolerated NGT, has been removed             + Flatus             Tolerating full liquids             Would keep on full liquids for another day then advance 6. FEN             Per #6             Hyponatremia- dec IVF, bmet in AM             Voiding very well, nurse reports 500 cc out since start of her shift (0700)             D/c flowmax, titrate urecholine down 7. GERD             protonix       8.  DVT/PE prophylaxis             lovenox x 4 weeks  9.   Metabolic bone disease             Vitamin D (25 OH), mildly decreased, supplement               10. Disposition             stable for inpatient rehab today    Disposition: CIR  Discharge Orders   Future Orders Complete By Expires     Call MD / Call 911  As directed     Comments:      If you experience chest pain or shortness of breath, CALL 911 and be transported to the hospital emergency room.  If you develope a fever above 101 F, pus (white drainage) or increased drainage or redness at the wound, or calf pain, call your surgeon's office.    Constipation Prevention  As directed     Comments:      Drink plenty of fluids.  Prune juice may be helpful.  You may use a stool softener, such as Colace (over the counter) 100 mg twice a day.  Use MiraLax (over the counter) for constipation as needed.    Discharge instructions  As directed     Comments:      Orthopaedic Trauma Service Discharge Instructions,   General Discharge Instructions  WEIGHT BEARING STATUS: Touch down weight bearing R leg  RANGE OF MOTION/ACTIVITY: range of motion as tolerated Right hip. Avoid extreme ranges  Diet: as you were eating previously.  Can use over the counter stool softeners and bowel preparations, such as Miralax, to help with bowel movements.  Narcotics can be constipating.  Be sure to drink plenty of fluids  STOP SMOKING OR USING NICOTINE PRODUCTS!!!!  As discussed nicotine severely impairs your body's ability to heal surgical and traumatic wounds but also impairs bone healing.  Wounds and  bone heal by forming microscopic blood vessels (angiogenesis) and nicotine is a vasoconstrictor (essentially, shrinks blood vessels).  Therefore, if vasoconstriction occurs to these microscopic blood vessels they essentially disappear and are unable to deliver necessary nutrients to the healing tissue.  This is one modifiable factor that you can do to dramatically increase your chances of healing your injury.     (This means no smoking, no nicotine gum, patches, etc)  DO NOT USE NONSTEROIDAL ANTI-INFLAMMATORY DRUGS (NSAID'S)  Using products such as Advil (ibuprofen), Aleve (naproxen), Motrin (ibuprofen) for additional pain control during fracture healing can delay and/or prevent the healing response.  If you would like to take over the counter (OTC) medication, Tylenol (acetaminophen) is ok.  However, some narcotic medications that are given for pain control contain acetaminophen as well. Therefore, you should not exceed more than 4000 mg of tylenol in a day if you do not have liver disease.  Also note that there are may OTC medicines, such as cold medicines and allergy medicines that my contain tylenol as well.  If you have any questions about medications and/or interactions please ask your doctor/PA or your pharmacist.   PAIN MEDICATION USE AND EXPECTATIONS  You have likely been given narcotic medications to help control your pain.  After a traumatic event that results in an fracture (broken bone) with or without surgery, it is ok to use narcotic pain medications to help control one's pain.  We understand that everyone responds to pain differently and each individual patient will be evaluated on a regular basis for the continued need for narcotic medications. Ideally, narcotic medication use should last no more than 6-8 weeks (coinciding with fracture healing).   As a patient it is your responsibility as well to monitor narcotic medication use and report the amount and frequency you use these medications when you come to your office visit.   We would also advise that if you are using narcotic medications, you should take a dose prior to therapy to maximize you participation.  IF YOU ARE ON NARCOTIC MEDICATIONS IT IS NOT PERMISSIBLE TO OPERATE A MOTOR VEHICLE (MOTORCYCLE/CAR/TRUCK/MOPED) OR HEAVY MACHINERY DO NOT MIX NARCOTICS WITH OTHER CNS (CENTRAL NERVOUS SYSTEM) DEPRESSANTS SUCH AS ALCOHOL       ICE  AND ELEVATE INJURED/OPERATIVE EXTREMITY  Using ice and elevating the injured extremity above your heart can help with swelling and pain control.  Icing in a pulsatile fashion, such as 20 minutes on and 20 minutes off, can be followed.    Do not place ice directly on skin. Make sure there is a barrier between to skin and the ice pack.    Using frozen items such as frozen peas works well as the conform nicely to the are that needs to be iced.  USE AN ACE WRAP OR TED HOSE FOR SWELLING CONTROL  In addition to icing and elevation, Ace wraps or TED hose are used to help limit and resolve swelling.  It is recommended to use Ace wraps or TED hose until you are informed to stop.    When using Ace Wraps start the wrapping distally (farthest away from the body) and wrap proximally (closer to the body)   Example: If you had surgery on your leg or thing and you do not have a splint on, start the ace wrap at the toes and work your way up to the thigh        If you had surgery on your upper extremity and do not have  a splint on, start the ace wrap at your fingers and work your way up to the upper arm  IF YOU ARE IN A SPLINT OR CAST DO NOT REMOVE IT FOR ANY REASON   If your splint gets wet for any reason please contact the office immediately. You may shower in your splint or cast as long as you keep it dry.  This can be done by wrapping in a cast cover or garbage back (or similar)  Do Not stick any thing down your splint or cast such as pencils, money, or hangers to try and scratch yourself with.  If you feel itchy take benadryl as prescribed on the bottle for itching  IF YOU ARE IN A CAM BOOT (BLACK BOOT)  You may remove boot periodically. Perform daily dressing changes as noted below.  Wash the liner of the boot regularly and wear a sock when wearing the boot. It is recommended that you sleep in the boot until told otherwise  CALL THE OFFICE WITH ANY QUESTIONS OR CONCERTS: [phone]    Discharge Wound Care  Instructions  Do NOT apply any ointments, solutions or lotions to pin sites or surgical wounds.  These prevent needed drainage and even though solutions like hydrogen peroxide kill bacteria, they also damage cells lining the pin sites that help fight infection.  Applying lotions or ointments can keep the wounds moist and can cause them to breakdown and open up as well. This can increase the risk for infection. When in doubt call the office.  Surgical incisions should be dressed daily.  If any drainage is noted, use one layer of adaptic, then gauze, Kerlix, and an ace wrap.  Once the incision is completely dry and without drainage, it may be left open to air out.  Showering may begin 36-48 hours later.  Cleaning gently with soap and water.  Traumatic wounds should be dressed daily as well.    One layer of adaptic, gauze, Kerlix, then ace wrap.  The adaptic can be discontinued once the draining has ceased    If you have a wet to dry dressing: wet the gauze with saline the squeeze as much saline out so the gauze is moist (not soaking wet), place moistened gauze over wound, then place a dry gauze over the moist one, followed by Kerlix wrap, then ace wrap.    Increase activity slowly as tolerated  As directed     Touch down weight bearing  As directed         Medication List     As of 09/14/2012  8:53 AM    Notice      You have not been prescribed any medications.            Follow-up Information   Follow up with HANDY,MICHAEL H, MD. Schedule an appointment as soon as possible for a visit in 2 weeks.   Contact information:   [address] [place] [address] [phone]       Discharge Instructions and Plan:  Pt has sustained a severe injury to his R acetabulum. We were able to repair his acetabulum via plate osteosynthesis.  Surgery was technically successful.   Pt will be TDWB x 8 weeks with WBAT thereafter.  As this surgery utilized an  anterior approach the pt does not have any formal ROM restrictions, however extreme ranges should be avoided so as not to stress the repair Sutures should be removed in 10 days Pt  will remain on lovenox for DVT and PE prophylaxis for 4 weeks  Given the amount of damage to the hip joint pt is at an increased risk for posttraumatic arthritis Pt will also likely be out of work at least 4 months but could possibly return sooner if appropriate accommodations are made  We will see the pt back in 2 weeks to evaluate his progress and obtain post op films.  We will also remove his sutures if they are still present  If there are any questions or concerns about therapy or care we should be contacted immediately at [phone]  Signed:  Mearl Latin, PA-C [facility] [phone] (P) 09/14/2012, 8:53 AM

## 2012-09-15 ENCOUNTER — Inpatient Hospital Stay (HOSPITAL_COMMUNITY): Payer: BC Managed Care – PPO

## 2012-09-15 ENCOUNTER — Inpatient Hospital Stay (HOSPITAL_COMMUNITY): Payer: BC Managed Care – PPO | Admitting: Occupational Therapy

## 2012-09-15 DIAGNOSIS — M7989 Other specified soft tissue disorders: Secondary | ICD-10-CM

## 2012-09-15 DIAGNOSIS — S32401A Unspecified fracture of right acetabulum, initial encounter for closed fracture: Secondary | ICD-10-CM

## 2012-09-15 DIAGNOSIS — W1789XA Other fall from one level to another, initial encounter: Secondary | ICD-10-CM

## 2012-09-15 DIAGNOSIS — S32409A Unspecified fracture of unspecified acetabulum, initial encounter for closed fracture: Secondary | ICD-10-CM

## 2012-09-15 HISTORY — DX: Unspecified fracture of right acetabulum, initial encounter for closed fracture: S32.401A

## 2012-09-15 LAB — COMPREHENSIVE METABOLIC PANEL
ALT: 43 U/L (ref 0–53)
AST: 53 U/L — ABNORMAL HIGH (ref 0–37)
Albumin: 2.4 g/dL — ABNORMAL LOW (ref 3.5–5.2)
Calcium: 8.8 mg/dL (ref 8.4–10.5)
Sodium: 137 mEq/L (ref 135–145)
Total Protein: 5.6 g/dL — ABNORMAL LOW (ref 6.0–8.3)

## 2012-09-15 LAB — CBC WITH DIFFERENTIAL/PLATELET
Basophils Absolute: 0 10*3/uL (ref 0.0–0.1)
Basophils Relative: 0 % (ref 0–1)
Eosinophils Absolute: 0.2 10*3/uL (ref 0.0–0.7)
Eosinophils Relative: 2 % (ref 0–5)
Lymphocytes Relative: 9 % — ABNORMAL LOW (ref 12–46)
MCH: 30.4 pg (ref 26.0–34.0)
MCHC: 34.8 g/dL (ref 30.0–36.0)
MCV: 87.2 fL (ref 78.0–100.0)
Platelets: 459 10*3/uL — ABNORMAL HIGH (ref 150–400)
RDW: 13 % (ref 11.5–15.5)
WBC: 7.4 10*3/uL (ref 4.0–10.5)

## 2012-09-15 MED ORDER — OXYCODONE HCL ER 10 MG PO T12A
10.0000 mg | EXTENDED_RELEASE_TABLET | Freq: Two times a day (BID) | ORAL | Status: DC
  Administered 2012-09-15 – 2012-09-24 (×19): 10 mg via ORAL
  Filled 2012-09-15 (×19): qty 1

## 2012-09-15 MED ORDER — GUAIFENESIN-DM 100-10 MG/5ML PO SYRP: 10.0000 mL | ORAL_SOLUTION | ORAL | Status: DC | PRN

## 2012-09-15 NOTE — Evaluation (Signed)
Occupational Therapy Assessment and Plan & Session Note  Patient Details  Name: Leslie Cox MRN: [id number] Date of Birth: 1945-12-11  OT Diagnosis: abnormal posture, acute pain, altered mental status and muscle weakness (generalized) Rehab Potential: Rehab Potential: Good ELOS: 10-12 days   Today's Date: 09/15/2012  ASSESSMENT AND PLAN  Problem List:  Patient Active Problem List  Diagnosis  . PARESTHESIA  . 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 Acid reflux  . Ileus, postoperative  . Scrotal edema  . Acute urinary retention most likely secondary to scrotal edema and pelvic trauma  . Hyponatremia  . Unspecified vitamin D deficiency  . Right acetabular fracture    Past Medical History:  Past Medical History  Diagnosis Date  . Hypertension   . GERD (gastroesophageal reflux disease)    Past Surgical History:  Past Surgical History  Procedure Laterality Date  . Tonsillectomy    . External fixation leg Right 09/05/2012    Procedure: Application of traction bow externally;  Surgeon: Shelda Pal, MD;  Location: [facility] OR;  Service: Orthopedics;  Laterality: Right;  . Orif acetabular fracture Right 09/08/2012    Procedure: OPEN REDUCTION INTERNAL FIXATION (ORIF) ACETABULAR FRACTURE;  Surgeon: Budd Palmer, MD;  Location: [facility] OR;  Service: Orthopedics;  Laterality: Right;    Clinical Impression: Leslie Cox is a 67 y.o. right-handed male admitted 09/05/2012 after mechanical fall. Patient reports slipping with point of impact being the right side of his body. Fall from about 3-4 feet onto concrete. There was no loss of consciousness. X-rays and imaging revealed a right acetabulum fracture anterior column and posterior hemitransverse. Orthopedic services consulted trans-tibial traction pin placed placed and later underwent operative repair of right acetabular fracture/ORIF 09/08/2012 per Dr. Carola Frost. Touchdown weightbearing right lower extremity. Placed on  subcutaneous Lovenox for DVT prophylaxis. Patient initially been placed on Coumadin for DVT prophylaxis discontinued due to super therapeutic levels and advised to continue only Lovenox for now. Postoperative anemia with latest hemoglobin 8.6. Noted bouts of urinary retention placed on Urecholine and monitored. Patient developed ileus with nasogastric tube placed as well as intravenous fluids for hydration. His nasogastric tube was removed 09/13/2012 placed on clear liquid diet and monitor for increased nausea. Latest abdominal film 09/12/2012 shows small bowel gas pattern to be normal. Physical therapy evaluation completed 09/09/2012 with recommendations of physical medicine rehabilitation consult to consider inpatient rehabilitation services. Patient was felt to be a candidate for inpatient rehabilitation services and was admitted for comprehensive rehabilitation program. Patient transferred to CIR on 09/14/2012 .    Patient currently requires min-> total assist with basic self-care skills and IADL secondary to muscle weakness and muscle joint tightness and decreased sitting balance, decreased standing balance, decreased postural control, decreased balance strategies and difficulty maintaining precautions.  Prior to hospitalization, patient could complete ADLs & IADLs independently.   Patient will benefit from skilled intervention to increase independence with basic self-care skills prior to discharge home with care partner.  Anticipate patient will require intermittent supervision and OT is TBD.  OT - End of Session Activity Tolerance: Tolerates 10 - 20 min activity with multiple rests Endurance Deficit: Yes OT Assessment Rehab Potential: Good Barriers to Discharge: None (none known at this time) Barriers to Discharge Comments: Goals set for supervision-> mod I OT Plan OT Intensity: Minimum of 1-2 x/day, 45 to 90 minutes OT Frequency: 5 out of 7 days OT Duration/Estimated Length of Stay: 10-12  days OT Treatment/Interventions: Metallurgist training;Community reintegration;Discharge planning;DME/adaptive  equipment instruction;Functional mobility training;Neuromuscular re-education;Pain management;Patient/family education;Psychosocial support;Self Care/advanced ADL retraining;Skin care/wound managment;Splinting/orthotics;Therapeutic Activities;Therapeutic Exercise;UE/LE Strength taining/ROM;UE/LE Coordination activities;Wheelchair propulsion/positioning OT Recommendation Patient destination: Home (patient states sister-in-laws house) Follow Up Recommendations: Other (comment) (OT: TBD) Equipment Recommended: Tub/shower seat;Tub/shower bench Equipment Details: Patient states he has a BSC  Precautions/Restrictions  Precautions Precautions: Fall Restrictions Weight Bearing Restrictions: Yes RLE Weight Bearing: Touchdown weight bearing  General Chart Reviewed: Yes Family/Caregiver Present: No  Pain Pain Assessment Pain Assessment: 0-10 Pain Score:   9 Pain Type: Acute pain Pain Location: Hip Pain Orientation: Right Pain Descriptors: Aching Pain Frequency: Intermittent Pain Onset: Gradual Patients Stated Pain Goal: 3 Pain Intervention(s): Medication (See eMAR);Repositioned Multiple Pain Sites: No  Home Living/Prior Functioning Home Living Lives With: Spouse Available Help at Discharge: Family Type of Home: House (Patient states they plan on living in sister-in-laws house) Home Access: Stairs to enter Entrance Stairs-Number of Steps: Garage entrance=1 step, front entrance =multiple steps Entrance Stairs-Rails: None Home Layout: Two level;Able to live on main level with bedroom/bathroom Bathroom Shower/Tub: Health visitor: Standard Home Adaptive Equipment: None Additional Comments: Patient reports him and his wife plan to live with his siter-in-law at discharge IADL History Homemaking Responsibilities: Yes Meal Prep Responsibility:  Secondary Laundry Responsibility: Secondary Cleaning Responsibility: Secondary Bill Paying/Finance Responsibility: Secondary Shopping Responsibility: Secondary Current License: Yes Mode of Transportation: Car Occupation: Full time employment Type of Occupation: [employer] full time and [employer] part time Prior Function Level of Independence: Independent with basic ADLs;Independent with gait;Independent with transfers Able to Take Stairs?: Yes Driving: Yes Vocation: Full time employment  ADL - See FIM  Vision/Perception  Vision - History Baseline Vision: Wears glasses all the time Patient Visual Report: No change from baseline Vision - Assessment Eye Alignment: Within Functional Limits Perception Perception: Within Functional Limits Praxis Praxis: Intact   Cognition Overall Cognitive Status: Appears within functional limits for tasks assessed Arousal/Alertness: Awake/alert Orientation Level: Oriented X4 Memory: Appears intact Awareness: Impaired Problem Solving: Impaired Safety/Judgment: Impaired  Sensation Sensation Light Touch: Appears Intact (BUEs) Stereognosis: Appears Intact (BUEs) Hot/Cold: Appears Intact (BUEs) Proprioception: Appears Intact (BUEs) Coordination Gross Motor Movements are Fluid and Coordinated: Yes Fine Motor Movements are Fluid and Coordinated: Yes  Motor - See Evaluation Navigator  Mobility - See Evaluation Navigator  Trunk/Postural Assessment - See Evaluation Navigator  Balance- See Evaluation Navigator  Extremity/Trunk Assessment RUE Assessment RUE Assessment: Within Functional Limits (can benefit from UE strengthening) LUE Assessment LUE Assessment: Within Functional Limits (can benefit from UE strengthening)  FIM:  FIM - Eating Eating Activity: 0: Activity did not occur FIM - Grooming Grooming Steps: Wash, rinse, dry face;Wash, rinse, dry hands;Oral care, brush teeth, clean dentures;Brush, comb hair Grooming: 5: Set-up  assist to obtain items FIM - Bathing Bathing: 0: Activity did not occur FIM - Upper Body Dressing/Undressing Upper body dressing/undressing steps patient completed: Thread/unthread right sleeve of pullover shirt/dresss;Put head through opening of pull over shirt/dress;Pull shirt over trunk Upper body dressing/undressing: 4: Steadying assist FIM - Lower Body Dressing/Undressing Lower body dressing/undressing: 1: Total-Patient completed less than 25% of tasks FIM - Toileting Toileting: 0: Activity did not occur FIM - Banker Devices: Walker;HOB elevated;Bed rails Bed/Chair Transfer: 3: Supine > Sit: Mod A (lifting assist/Pt. 50-74%/lift 2 legs;4: Bed > Chair or W/C: Min A (steadying Pt. > 75%) FIM - Toilet Transfers Toilet Transfers: 0-Activity did not occur FIM - Tub/Shower Transfers Tub/shower Transfers: 0-Activity did not occur or was simulated   Refer to Care  Plan for Long Term Goals  Recommendations for other services: None at this time.  Discharge Criteria: Patient will be discharged from OT if patient refuses treatment 3 consecutive times without medical reason, if treatment goals not met, if there is a change in medical status, if patient makes no progress towards goals or if patient is discharged from hospital.  The above assessment, treatment plan, treatment alternatives and goals were discussed and mutually agreed upon: by patient  ------------------------------------------------------------------------------------------------------  SESSION NOTE [id number] - 55 Minutes Individual Therapy Patient with 9/10 complaints of pain in right hip, RN made aware Initial 1:1 occupational therapy evaluation completed. Focused skilled intervention on bed mobility, UB/LB dressing, sit<>stands, dynamic standing balance/tolerance/endurance, functional mobility/ambulation using rolling walker, adhering to TWB status -> RLE, and grooming tasks seated in  bed side chair at sink side. At end of session patient left seated in bed side chair beside bed with call bell and phone within reach. Patient with poor problem solving and poor overall awareness during evaluation.    Kj Imbert 09/15/2012, 10:39 AM

## 2012-09-15 NOTE — Progress Notes (Signed)
Occupational Therapy Session Note  Patient Details  Name: Renso Swett MRN: [id number] Date of Birth: 09-01-1945  Today's Date: 09/15/2012 Time: 1510-1535 Time Calculation (min): 25 min  Short Term Goals: Week 1:  OT Short Term Goal 1 (Week 1): Patient will complete UB/LB bathing with supervision OT Short Term Goal 2 (Week 1): Patient will complete UB dressing with supervision/set-up assist OT Short Term Goal 3 (Week 1): Patient will complete LB dressing with supervision/set-up assist OT Short Term Goal 4 (Week 1): Patient will ambulate into bathroom using rolling walker with  minimal assistance for toilet transfer OT Short Term Goal 5 (Week 1): Patient will perform at least 2 grooming tasks standing at sink with supervision  Skilled Therapeutic Interventions/Progress Updates:  Engaged in practice use of AE to doff right shoe and don right sock, stand step with walker w/c>bed and bed mobility.  Focused session on return demonstration of AE, maintaining weight bearing status and bed mobility for sit>supine and scooting in bed.  Therapy Documentation Precautions:  Precautions Precautions: Fall Restrictions Weight Bearing Restrictions: Yes RLE Weight Bearing: Touchdown weight bearing Pain: Right hip pain with sit>supine, not rated, repositioned.  Therapy/Group: Individual Therapy  Ervin Rothbauer 09/15/2012, 4:09 PM

## 2012-09-15 NOTE — Progress Notes (Signed)
Occupational Therapy Session Note  Patient Details  Name: Leslie Cox MRN: [id number] Date of Birth: 1945-12-31  Today's Date: 09/15/2012 Time: 1415-1500 Time Calculation (min): 45 min  Short Term Goals: Week 1:  OT Short Term Goal 1 (Week 1): Patient will complete UB/LB bathing with supervision OT Short Term Goal 2 (Week 1): Patient will complete UB dressing with supervision/set-up assist OT Short Term Goal 3 (Week 1): Patient will complete LB dressing with supervision/set-up assist OT Short Term Goal 4 (Week 1): Patient will ambulate into bathroom using rolling walker with  minimal assistance for toilet transfer OT Short Term Goal 5 (Week 1): Patient will perform at least 2 grooming tasks standing at sink with supervision  Skilled Therapeutic Interventions/Progress Updates:    Pt seen for 1:1 OT with focus on functional mobility and transfers in ADL apartment with focus on adhering to TDWB and RW safety.  Pt requested to don leg rests, however required assistance with aligning them.  Engaged in tub/shower transfer with tub bench and toilet transfer with RW.  Pt required verbal cues for sequencing of RW and cues to maintain TDWB.  Pt required increased time and physical assistance to lift Rt leg over tub ledge with tub/shower transfer.  Pt reported needing to toilet, ambulated to toilet with min assist and cues for sequencing with RW, mod assist and increased cues when turning.  Pt completed hygiene and clothing management with steady assist.  Therapy Documentation Precautions:  Precautions Precautions: Fall Restrictions Weight Bearing Restrictions: Yes RLE Weight Bearing: Touchdown weight bearing Pain: Pain Assessment Pain Assessment: 0-10 (given oxycontin 10 mg po) Pain Score:   5 Pain Type: Surgical pain Pain Location: Abdomen Pain Orientation: Mid Pain Descriptors: Aching Pain Frequency: Intermittent Pain Onset: Gradual Patients Stated Pain Goal: 3 Pain Intervention(s):  Medication (See eMAR);Repositioned Multiple Pain Sites: No  See FIM for current functional status  Therapy/Group: Individual Therapy  Leonette Monarch 09/15/2012, 3:27 PM

## 2012-09-15 NOTE — Progress Notes (Signed)
Subjective/Complaints: Anxious regarding pain and upcoming therapy. Pain has been a consistent 9/10 A 12 point review of systems has been performed and if not noted above is otherwise negative.   Objective: Vital Signs: Blood pressure 150/79, pulse 107, temperature 97.3 F (36.3 C), temperature source Oral, resp. rate 20, height 5\' 7"  (1.702 m), weight 76.703 kg (169 lb 1.6 oz), SpO2 92.00%. No results found.  Recent Labs  09/14/12 1707 09/15/12 0545  WBC 8.5 7.4  HGB 9.4* 10.0*  HCT 27.0* 28.7*  PLT 457* 459*    Recent Labs  09/14/12 0710 09/14/12 1707 09/15/12 0545  NA 132*  --  137  K 3.6  --  4.0  CL 97  --  100  GLUCOSE 118*  --  121*  BUN 5*  --  6  CREATININE 0.43* 0.44* 0.51  CALCIUM 8.3*  --  8.8   CBG (last 3)  No results found for this basename: GLUCAP,  in the last 72 hours  Wt Readings from Last 3 Encounters:  09/14/12 76.703 kg (169 lb 1.6 oz)  09/08/12 82.9 kg (182 lb 12.2 oz)  09/08/12 82.9 kg (182 lb 12.2 oz)    Physical Exam:  Blood pressure 164/86, pulse 105, temperature 98.7 F (37.1 C), temperature source Oral, resp. rate 18, height 5\' 7"  (1.702 m), weight 82.9 kg (182 lb 12.2 oz), SpO2 92.00%.  Vitals reviewed.  HENT: oral mucosa pink and moist  Head: Normocephalic.  Eyes: EOM are normal.  Neck: Neck supple. No thyromegaly present.  Cardiovascular: Normal rate and regular rhythm. No murmurs or gallops  Pulmonary/Chest: Effort normal and breath sounds normal. No respiratory distress.  Abdominal: Nontender. Bowel sounds are distant. Abdomen is mildly distended still. Neurological:  Patient is still occasionally anxious. More cooperative. Participated with all parts of exam. Patient was alert and oriented x3. He can follow 3 step commands. UE's grossly 4/5. RLE 1-2/5 proximal to 4/5 distal. LLE 3/5 prox to 4/5 distally. No sensory or CN deficits.  Skin:  Surgical sites dressed with minimal to no drainage. Pelvis/left leg appropriately  tender.   Assessment/Plan: 1. Functional deficits secondary tocomplex acetabular fx s/p ORIF  which require 3+ hours per day of interdisciplinary therapy in a comprehensive inpatient rehab setting. Physiatrist is providing close team supervision and 24 hour management of active medical problems listed below. Physiatrist and rehab team continue to assess barriers to discharge/monitor patient progress toward functional and medical goals. FIM:                   Comprehension Comprehension Mode: Auditory Comprehension: 5-Follows basic conversation/direction: With extra time/assistive device  Expression Expression Mode: Verbal Expression: 5-Expresses basic needs/ideas: With extra time/assistive device  Social Interaction Social Interaction: 6-Interacts appropriately with others with medication or extra time (anti-anxiety, antidepressant).  Problem Solving Problem Solving: 5-Solves basic 90% of the time/requires cueing < 10% of the time  Memory Memory: 6-More than reasonable amt of time  Medical Problem List and Plan:  1. right acetabular fracture. Status post ORIF 09/08/2012  2. DVT Prophylaxis/Anticoagulation: Subcutaneous Lovenox. Check Doppler studies  3. Pain Management: Hydrocodone/Robaxin as needed. Monitor with increased activity   -add low dose oxycontin 10mg  q12 4. Neuropsych: This patient is capable of making decisions on his/her own behalf.  5. Postoperative anemia. Continue iron supplement. Followup CBC  6. Postoperative ileus. Advance diet as tolerated. Continue Reglan 3 times a day. Abdomen still a little distended. Having mostly gas at this point.  7. Urinary retention.  Urecholine 25 mg 4 times a day. Check PVRs x3. Urine study 09/11/2012 negative  LOS (Days) 1 A FACE TO FACE EVALUATION WAS PERFORMED  SWARTZ,ZACHARY T 09/15/2012 9:10 AM

## 2012-09-15 NOTE — Plan of Care (Signed)
Problem: RH BOWEL ELIMINATION Goal: RH STG MANAGE BOWEL WITH ASSISTANCE STG Manage Bowel with Modified Independence Assistance.  Outcome: Progressing Pt last bowel movement 09/14/12 after laxative, liquid stool,

## 2012-09-15 NOTE — Progress Notes (Signed)
Patient information reviewed and entered into eRehab system by Hrishikesh Hoeg, RN, CRRN, PPS Coordinator.  Information including medical coding and functional independence measure will be reviewed and updated through discharge.    

## 2012-09-15 NOTE — Progress Notes (Signed)
VASCULAR LAB PRELIMINARY  PRELIMINARY  PRELIMINARY  PRELIMINARY  Bilateral lower extremity venous duplex completed.    Preliminary report:  Bilateral:  No evidence of DVT, superficial thrombosis, or Baker's Cyst.   Keithan Dileonardo, RVS 09/15/2012, 11:41 AM

## 2012-09-15 NOTE — Evaluation (Signed)
Physical Therapy Assessment and Plan  Patient Details  Name: Leslie Cox MRN: [id number] Date of Birth: 05-May-1946  PT Diagnosis: Abnormality of gait, Difficulty walking, Edema, Impaired cognition, Impaired sensation, Muscle weakness and Pain in RLE Rehab Potential: Good ELOS: ~ 10 days   Today's Date: 09/15/2012 Time: 0930-1030 Time Calculation (min): 60 min  Problem List:  Patient Active Problem List  Diagnosis  . PARESTHESIA  . 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 Acid reflux  . Ileus, postoperative  . Scrotal edema  . Acute urinary retention most likely secondary to scrotal edema and pelvic trauma  . Hyponatremia  . Unspecified vitamin D deficiency  . Right acetabular fracture    Past Medical History:  Past Medical History  Diagnosis Date  . Hypertension   . GERD (gastroesophageal reflux disease)    Past Surgical History:  Past Surgical History  Procedure Laterality Date  . Tonsillectomy    . External fixation leg Right 09/05/2012    Procedure: Application of traction bow externally;  Surgeon: Shelda Pal, MD;  Location: [facility] OR;  Service: Orthopedics;  Laterality: Right;  . Orif acetabular fracture Right 09/08/2012    Procedure: OPEN REDUCTION INTERNAL FIXATION (ORIF) ACETABULAR FRACTURE;  Surgeon: Budd Palmer, MD;  Location: [facility] OR;  Service: Orthopedics;  Laterality: Right;    Assessment & Plan Clinical Impression: Patient is a 67 y.o. year old right-handed male admitted 09/05/2012 after mechanical fall. Patient reports slipping with point of impact being the right side of his body. Fall from about 3-4 feet onto concrete. There was no loss of consciousness. X-rays and imaging revealed a right acetabulum fracture anterior column and posterior hemitransverse. Orthopedic services consulted trans-tibial traction pin placed placed and later underwent operative repair of right acetabular fracture/ORIF 09/08/2012 per Dr. Carola Frost. Touchdown  weightbearing right lower extremity. Placed on subcutaneous Lovenox for DVT prophylaxis. Patient initially been placed on Coumadin for DVT prophylaxis discontinued due to super therapeutic levels and advised to continue only Lovenox for now. Postoperative anemia with latest hemoglobin 8.6. Noted bouts of urinary retention placed on Urecholine and monitored. Patient developed ileus with nasogastric tube placed as well as intravenous fluids for hydration. His nasogastric tube was removed 09/13/2012 placed on clear liquid diet and monitor for increased nausea. Latest abdominal film 09/12/2012 shows small bowel gas pattern to be normal. Patient transferred to CIR on 09/14/2012 .   Patient currently requires min to mod A with mobility secondary to muscle weakness and muscle joint tightness, decreased cardiorespiratoy endurance, decreased attention, decreased awareness, decreased problem solving and decreased safety awareness and decreased standing balance, decreased balance strategies and difficulty maintaining precautions.  Prior to hospitalization, patient was independent  with mobility and lived with Spouse in a House (plan to d/c to sister-in-laws house).  Home access is Garage entrance=1 step, front entrance =multiple steps.  Patient will benefit from skilled PT intervention to maximize safe functional mobility, minimize fall risk and decrease caregiver burden for planned discharge home with 24 hour supervision.  Anticipate patient will benefit from follow up HHPT v OPPT at discharge.  PT - End of Session Endurance Deficit: Yes PT Assessment Rehab Potential: Good Barriers to Discharge: None PT Plan PT Intensity: Minimum of 1-2 x/day ,45 to 90 minutes PT Frequency: 5 out of 7 days PT Duration Estimated Length of Stay: ~ 10 days PT Treatment/Interventions: Ambulation/gait training;Balance/vestibular training;Cognitive remediation/compensation;Community reintegration;Discharge planning;DME/adaptive  equipment instruction;Functional mobility training;Neuromuscular re-education;Pain management;Patient/family education;Psychosocial support;Skin care/wound management;Splinting/orthotics;Stair training;Therapeutic Activities;Therapeutic Exercise;UE/LE Strength  taining/ROM;UE/LE Coordination activities;Wheelchair propulsion/positioning PT Recommendation Follow Up Recommendations: Other (comment) (HHPT v OPPT) Patient destination:  (sister in law's house) Equipment Recommended: Rolling walker with 5" wheels;Wheelchair (measurements);Wheelchair cushion (measurements)  Skilled Therapeutic Intervention Individual treatment initiated with focus on techniques for transfers using RW (cues to keep RLE out in front to limit WB), gait training with RW (max cues needed for sequencing of RW and taking steps with gait, practiced up/down 1 step with RW backwards, w/c mobility and introduction to w/c parts management (therapist put elevating legrest on RLE to aid with edema control). Pt with difficulty maintaining attention in gym and difficulty with problem solving use of RW for gait despite cues; may be medication related or baseline personality. Will continue to monitor.  PT Evaluation Precautions/Restrictions Precautions Precautions: Fall Restrictions Weight Bearing Restrictions: Yes RLE Weight Bearing: Touchdown weight bearing Pain Reports being premedicated for pain - increases with mobility in RLE. Home Living/Prior Functioning Home Living Lives With: Spouse Available Help at Discharge: Family Type of Home: House (plan to d/c to sister-in-laws house) Home Access: Stairs to enter [employer] of Steps: Garage entrance=1 step, front entrance =multiple steps Entrance Stairs-Rails: None Home Layout: Two level;Able to live on main level with bedroom/bathroom Bathroom Shower/Tub: Health visitor: Standard Home Adaptive Equipment: None Additional Comments: Patient reports him  and his wife plan to live with his siter-in-law at discharge Prior Function Level of Independence: Independent with basic ADLs;Independent with gait;Independent with transfers Able to Take Stairs?: Yes Driving: Yes Vocation: Full time employment Vocation Requirements: works at [employer] fulltime; [employer] part time Vision/Perception  Vision - History Baseline Vision: Wears glasses all the time Patient Visual Report: No change from baseline Vision - Assessment Eye Alignment: Within Chemical engineer Perception: Within Functional Limits Praxis Praxis: Intact  Cognition Arousal/Alertness: Awake/alert Orientation Level: Oriented X4 Memory: Appears intact Awareness: Impaired Problem Solving: Impaired Safety/Judgment: Impaired Comments: Pt appears disorganized, decresed attention in busy environment in gym/hallways, and difficulty with sequencing using RW for gait Sensation Sensation Light Touch: Impaired Detail Light Touch Impaired Details: Impaired RLE;Impaired LLE (inconsistent but able to distinguish touch) Stereognosis: Appears Intact (BUEs) Hot/Cold: Appears Intact (BUEs) Proprioception: Appears Intact (BUEs) Coordination Gross Motor Movements are Fluid and Coordinated: Yes Fine Motor Movements are Fluid and Coordinated: Yes Motor  Motor Motor: Within Functional Limits    Locomotion  Ambulation Ambulation/Gait Assistance: 3: Mod assist Gait Gait Pattern:  (max cues for sequencing for RW and WB status)  Trunk/Postural Assessment  Cervical Assessment Cervical Assessment: Within Functional Limits Thoracic Assessment Thoracic Assessment: Within Functional Limits Lumbar Assessment Lumbar Assessment: Within Functional Limits  Balance Balance Balance Assessed: Yes Static Sitting Balance Static Sitting - Level of Assistance: 6: Modified independent (Device/Increase time) Dynamic Sitting Balance Dynamic Sitting - Level of Assistance: 5: Stand by  assistance Static Standing Balance Static Standing - Level of Assistance: 4: Min assist Dynamic Standing Balance Dynamic Standing - Level of Assistance: 4: Min assist Extremity Assessment  RUE Assessment RUE Assessment: Within Functional Limits (can benefit from UE strengthening) LUE Assessment LUE Assessment: Within Functional Limits (can benefit from UE strengthening) RLE Assessment RLE Assessment: Exceptions to [facility] (PROM WFL; strengh in hip 3-/5; knee 3+/5; ankle WFL) LLE Assessment LLE Assessment: Within Functional Limits (decreased muscular endurance)  FIM:  FIM - Bed/Chair Transfer Bed/Chair Transfer Assistive Devices: Therapist, occupational: 4: Supine > Sit: Min A (steadying Pt. > 75%/lift 1 leg);4: Sit > Supine: Min A (steadying pt. > 75%/lift 1 leg);4: Bed >  Chair or W/C: Min A (steadying Pt. > 75%);4: Chair or W/C > Bed: Min A (steadying Pt. > 75%) FIM - Locomotion: Wheelchair Locomotion: Wheelchair: 5: Travels 150 ft or more: maneuvers on rugs and over door sills with supervision, cueing or coaxing FIM - Locomotion: Ambulation Locomotion: Ambulation Assistive Devices: Designer, industrial/product Ambulation/Gait Assistance: 3: Mod assist Locomotion: Ambulation: 1: Travels less than 50 ft with moderate assistance (Pt: 50 - 74%) (mod A due to needing assist with managing RW; min A gaitt) FIM - Locomotion: Stairs Locomotion: Stairs Assistive Devices: Psychologist, educational: Stairs: 1: Up and Down < 4 stairs with minimal assistance (Pt.>75%)   Refer to Care Plan for Long Term Goals  Recommendations for other services: None  Discharge Criteria: Patient will be discharged from PT if patient refuses treatment 3 consecutive times without medical reason, if treatment goals not met, if there is a change in medical status, if patient makes no progress towards goals or if patient is discharged from hospital.  The above assessment, treatment plan, treatment alternatives and goals were discussed  and mutually agreed upon: by patient  Tedd Sias 09/15/2012, 11:38 AM

## 2012-09-16 ENCOUNTER — Inpatient Hospital Stay (HOSPITAL_COMMUNITY): Payer: BC Managed Care – PPO | Admitting: Occupational Therapy

## 2012-09-16 ENCOUNTER — Inpatient Hospital Stay (HOSPITAL_COMMUNITY): Payer: BC Managed Care – PPO

## 2012-09-16 DIAGNOSIS — W1789XA Other fall from one level to another, initial encounter: Secondary | ICD-10-CM

## 2012-09-16 DIAGNOSIS — S32409A Unspecified fracture of unspecified acetabulum, initial encounter for closed fracture: Secondary | ICD-10-CM

## 2012-09-16 MED ORDER — FLEET ENEMA 7-19 GM/118ML RE ENEM
1.0000 | ENEMA | Freq: Once | RECTAL | Status: AC
  Administered 2012-09-16: 1 via RECTAL
  Filled 2012-09-16 (×2): qty 1

## 2012-09-16 MED ORDER — BETHANECHOL CHLORIDE 10 MG PO TABS
10.0000 mg | ORAL_TABLET | Freq: Three times a day (TID) | ORAL | Status: DC
  Administered 2012-09-16 – 2012-09-18 (×6): 10 mg via ORAL
  Filled 2012-09-16 (×9): qty 1

## 2012-09-16 MED ORDER — POLYSACCHARIDE IRON COMPLEX 150 MG PO CAPS
150.0000 mg | ORAL_CAPSULE | Freq: Two times a day (BID) | ORAL | Status: DC
  Administered 2012-09-16 – 2012-09-23 (×15): 150 mg via ORAL
  Filled 2012-09-16 (×17): qty 1

## 2012-09-16 NOTE — Progress Notes (Signed)
Physical Therapy Session Note  Patient Details  Name: Leslie Cox MRN: [id number] Date of Birth: 1946/05/29  Today's Date: 09/16/2012  Short Term Goals: Week 1:  PT Short Term Goal 1 (Week 1): = LTGs  Session #1 Time: 1345-1430 Time Calculation (min): 45 min  Treatment session focused on gait with RW for endurance and strengthening (min cues for gait pattern in straight line, but increased difficulty with coordinating use of RW with turning and backing up) with steady A/close S, stair training up/down curb step and then up/down 2 steps with RW backwards (pt reports his wife says there are 2 steps, not just one to get into the sister in law's house), w/c mobility on unit, and dynamic standing balance/tolerance as well as coordination to play Wii Bowling in standing while maintaining TDWB status.  Session #2 Time: 1500-1530 (30 min) Transfer training for stand step back to bed with RW with close S. Issued leg lifter to aid with bed mobility; pain limiting but pt able to go sit to supine with S; cues for technique. HEP for LE strengthening issued including ankle pumps, heel slides, SAQ, and hip abduction in supine with AAROM on RLE x 10 reps each with rest breaks as needed.   Therapy Documentation Precautions:  Precautions Precautions: Fall Restrictions Weight Bearing Restrictions: Yes RLE Weight Bearing: Touchdown weight bearing   Pain Premedicated for pain - no complaints except for with mobility of RLE.  See FIM for current functional status  Therapy/Group: Individual Therapy  Karolee Stamps [facility] 09/16/2012, 4:08 PM

## 2012-09-16 NOTE — Progress Notes (Signed)
Subjective/Complaints: Pain a little better. Having gas, but no BM. Anxious at times. In fair to good spirits this am. A 12 point review of systems has been performed and if not noted above is otherwise negative.   Objective: Vital Signs: Blood pressure 132/80, pulse 82, temperature 98.1 F (36.7 C), temperature source Oral, resp. rate 19, height 5\' 7"  (1.702 m), weight 76.703 kg (169 lb 1.6 oz), SpO2 97.00%. No results found.  Recent Labs  09/14/12 1707 09/15/12 0545  WBC 8.5 7.4  HGB 9.4* 10.0*  HCT 27.0* 28.7*  PLT 457* 459*    Recent Labs  09/14/12 0710 09/14/12 1707 09/15/12 0545  NA 132*  --  137  K 3.6  --  4.0  CL 97  --  100  GLUCOSE 118*  --  121*  BUN 5*  --  6  CREATININE 0.43* 0.44* 0.51  CALCIUM 8.3*  --  8.8   CBG (last 3)  No results found for this basename: GLUCAP,  in the last 72 hours  Wt Readings from Last 3 Encounters:  09/14/12 76.703 kg (169 lb 1.6 oz)  09/08/12 82.9 kg (182 lb 12.2 oz)  09/08/12 82.9 kg (182 lb 12.2 oz)    Physical Exam:  Blood pressure 164/86, pulse 105, temperature 98.7 F (37.1 C), temperature source Oral, resp. rate 18, height 5\' 7"  (1.702 m), weight 82.9 kg (182 lb 12.2 oz), SpO2 92.00%.  Vitals reviewed.  HENT: oral mucosa pink and moist  Head: Normocephalic.  Eyes: EOM are normal.  Neck: Neck supple. No thyromegaly present.  Cardiovascular: Normal rate and regular rhythm. No murmurs or gallops  Pulmonary/Chest: Effort normal and breath sounds normal. No respiratory distress.  Abdominal: Nontender. Bowel sounds are distant. Abdomen is  Only mildly distended. Neurological:  Patient is still occasionally anxious. More cooperative. Participated with all parts of exam. Patient was alert and oriented x3. He can follow 3 step commands. UE's grossly 4/5. RLE 1-2/5 proximal to 4/5 distal. LLE 3/5 prox to 4/5 distally. No sensory or CN deficits.  Skin:  Surgical sites dressed with minimal to no drainage. Pelvis/left leg  appropriately tender.   Assessment/Plan: 1. Functional deficits secondary tocomplex acetabular fx s/p ORIF  which require 3+ hours per day of interdisciplinary therapy in a comprehensive inpatient rehab setting. Physiatrist is providing close team supervision and 24 hour management of active medical problems listed below. Physiatrist and rehab team continue to assess barriers to discharge/monitor patient progress toward functional and medical goals. FIM: FIM - Bathing Bathing: 0: Activity did not occur  FIM - Upper Body Dressing/Undressing Upper body dressing/undressing steps patient completed: Thread/unthread right sleeve of pullover shirt/dresss;Put head through opening of pull over shirt/dress;Pull shirt over trunk Upper body dressing/undressing: 4: Steadying assist FIM - Lower Body Dressing/Undressing Lower body dressing/undressing: 1: Total-Patient completed less than 25% of tasks  FIM - Toileting Toileting steps completed by patient: Adjust clothing prior to toileting;Performs perineal hygiene;Adjust clothing after toileting Toileting Assistive Devices: Grab bar or rail for support Toileting: 4: Steadying assist  FIM - Diplomatic Services operational officer Devices: Grab bars Toilet Transfers: 3-To toilet/BSC: Mod A (lift or lower assist);4-From toilet/BSC: Min A (steadying Pt. > 75%)  FIM - Bed/Chair Transfer Bed/Chair Transfer Assistive Devices: Therapist, occupational: 4: Supine > Sit: Min A (steadying Pt. > 75%/lift 1 leg);4: Sit > Supine: Min A (steadying pt. > 75%/lift 1 leg);4: Bed > Chair or W/C: Min A (steadying Pt. > 75%);4: Chair or W/C > Bed: Min  A (steadying Pt. > 75%)  FIM - Locomotion: Wheelchair Locomotion: Wheelchair: 5: Travels 150 ft or more: maneuvers on rugs and over door sills with supervision, cueing or coaxing FIM - Locomotion: Ambulation Locomotion: Ambulation Assistive Devices: Designer, industrial/product Ambulation/Gait Assistance: 3: Mod  assist Locomotion: Ambulation: 1: Travels less than 50 ft with moderate assistance (Pt: 50 - 74%) (mod A due to needing assist with managing RW; min A gaitt)  Comprehension Comprehension Mode: Auditory Comprehension: 5-Follows basic conversation/direction: With extra time/assistive device  Expression Expression Mode: Verbal Expression: 5-Expresses basic needs/ideas: With extra time/assistive device  Social Interaction Social Interaction: 4-Interacts appropriately 75 - 89% of the time - Needs redirection for appropriate language or to initiate interaction.  Problem Solving Problem Solving: 4-Solves basic 75 - 89% of the time/requires cueing 10 - 24% of the time  Memory Memory: 6-More than reasonable amt of time  Medical Problem List and Plan:  1. right acetabular fracture. Status post ORIF 09/08/2012  2. DVT Prophylaxis/Anticoagulation: Subcutaneous Lovenox. Check Doppler studies  3. Pain Management: Hydrocodone/Robaxin as needed. Monitor with increased activity   -add low dose oxycontin 10mg  q12 4. Neuropsych: This patient is capable of making decisions on his/her own behalf.  5. Postoperative anemia. Continue iron supplement. Followup CBC  6. Postoperative ileus. Advanced diet. Continue Reglan 3 times a day.  . Having   gas.   -will try fleets enema today. SSE if no results 7. Urinary retention. Urecholine- decrease to 10mg  tid. Check PVRs x3. Urine study 09/11/2012 negative  LOS (Days) 2 A FACE TO FACE EVALUATION WAS PERFORMED  SWARTZ,ZACHARY T 09/16/2012 9:39 AM

## 2012-09-16 NOTE — Patient Care Conference (Signed)
Inpatient RehabilitationTeam Conference and Plan of Care Update Date: 09/15/2012   Time: 2:15 PM    Patient Name: Leslie Cox      Medical Record Number: [id number]  Date of Birth: 11-28-1945 Sex: Male         Room/Bed: 4035/4035-01 Payor Info: Payor: BLUE CROSS BLUE SHIELD  Plan: BCBS [place] PPO  Product Type: *No Product type*     Admitting Diagnosis: rt acet fx  Admit Date/Time:  09/14/2012  4:44 PM Admission Comments: No comment available   Primary Diagnosis:  Right acetabular fracture Principal Problem: Right acetabular fracture  Patient Active Problem List   Diagnosis Date Noted  . Right acetabular fracture 09/15/2012  . Hyponatremia 09/14/2012  . Unspecified vitamin D deficiency 09/14/2012  . Ileus, postoperative 09/13/2012  . Scrotal edema 09/13/2012  . Acute urinary retention most likely secondary to scrotal edema and pelvic trauma 09/13/2012  . Acid reflux 09/07/2012  . 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 PARESTHESIA 03/16/2010    Expected Discharge Date: Expected Discharge Date: 09/24/12  Team Members Present: Physician leading conference: Dr. Faith Rogue Social Worker Present: Amada Jupiter, LCSW Nurse Present: Gregor Hams, RN PT Present: Karolee Stamps, PT OT Present: Edwin Cap, Loistine Chance, OT     Current Status/Progress Goal Weekly Team Focus  Medical   right acetabular fx after fall. pain and anxiety issues, ileus  control pain, reduce anxiety  see above, work on regular movement. rx of constipation   Bowel/Bladder   incontinent of bladder/ use male urinal due to swollen scrotum / continent of bowel  cont of bowel and bladder  cont of bowel and bladder   Swallow/Nutrition/ Hydration             ADL's   overall min for UB ADLs and total for LB ADLs, mod assist for bed mobility, min-mod for transfers  overall supervision -> mod I  ADL retraining, sit<>stands, dynamic standing, transfers, overall activity  tolerance/endurance, RLE management   Mobility   min to mod A overall  mod I basic transfers and w/c mobility; S car transfer, gait, and step for home entry  overall activity tolerance, transfers, dynamic standing balance, gait training, strengthening    Communication             Safety/Cognition/ Behavioral Observations            Pain   surgical site pain/ acetaminophen 325-650 mg PRN, Norco 5-325 mg PRN  less or equal to 2  monitor effect of medication   Skin   cut from hip to hip suture with miraplex  free of new skin infection and breakdown  free of new skin breakdown and infection    Rehab Goals Patient on target to meet rehab goals: Yes *See Interdisciplinary Assessment and Plan and progress notes for long and short-term goals  Barriers to Discharge: pain, anxiety, bowels    Possible Resolutions to Barriers:  pain meds/ anxiety rx    Discharge Planning/Teaching Needs:  Home with sister - in - law and wife to provide 24/7 assistance      Team Discussion:  Appears a little overwhelmed with therapies on his first day.   Requires more verbal cues than expected with each activity. Hope this will improve as he becomes more comfortable on unit.  Anxious with anticipatory pain.    Revisions to Treatment Plan:  None   Continued Need for Acute Rehabilitation Level of Care: The patient requires daily medical management  by a physician with specialized training in physical medicine and rehabilitation for the following conditions: Daily direction of a multidisciplinary physical rehabilitation program to ensure safe treatment while eliciting the highest outcome that is of practical value to the patient.: Yes Daily medical management of patient stability for increased activity during participation in an intensive rehabilitation regime.: Yes Daily analysis of laboratory values and/or radiology reports with any subsequent need for medication adjustment of medical intervention for :  Other;Post surgical problems (anxiety, pain, GI)  Lebert Lovern 09/16/2012, 4:24 PM

## 2012-09-16 NOTE — Progress Notes (Signed)
Inpatient Rehabilitation Center Individual Statement of Services  Patient Name:  Leslie Cox  Date:  09/16/2012  Welcome to the Inpatient Rehabilitation Center.  Our goal is to provide you with an individualized program based on your diagnosis and situation, designed to meet your specific needs.  With this comprehensive rehabilitation program, you will be expected to participate in at least 3 hours of rehabilitation therapies Monday-Friday, with modified therapy programming on the weekends.  Your rehabilitation program will include the following services:  Physical Therapy (PT), Occupational Therapy (OT), 24 hour per day rehabilitation nursing, Therapeutic Recreaction (TR), Case Management ( Social Worker), Rehabilitation Medicine, Nutrition Services and Pharmacy Services  Weekly team conferences will be held on Tuesdays to discuss your progress.  Your  Social Worker will talk with you frequently to get your input and to update you on team discussions.  Team conferences with you and your family in attendance may also be held.  Expected length of stay: 2 weeks  Overall anticipated outcome: modified independent  Depending on your progress and recovery, your program may change.  Your  Social Worker will coordinate services and will keep you informed of any changes.  Your  Social Worker's name and contact numbers are listed  below.  The following services may also be recommended but are not provided by the Inpatient Rehabilitation Center:   Driving Evaluations  Home Health Rehabiltiation Services  Outpatient Rehabilitatation [facility]  Vocational Rehabilitation   Arrangements will be made to provide these services after discharge if needed.  Arrangements include referral to agencies that provide these services.  Your insurance has been verified to be:  BCBS [place] Your primary doctor is:  Dr. Amador Cunas  Pertinent information will be shared with your doctor and your insurance  company.  Social Worker:  [place], [place] [phone] or ([phone]  Information discussed with and copy given to patient by: Amada Jupiter, 09/16/2012, 3:55 PM

## 2012-09-16 NOTE — Plan of Care (Signed)
Problem: RH BOWEL ELIMINATION Goal: RH STG MANAGE BOWEL W/MEDICATION W/ASSISTANCE STG Manage Bowel with Medication with Min Assistance.  Outcome: Progressing Enema given today with moderate results

## 2012-09-16 NOTE — Progress Notes (Signed)
Social Work  Social Work Assessment and Plan  Patient Details  Name: Leslie Cox MRN: [id number] Date of Birth: February 24, 1946  Today's Date: 09/16/2012  Problem List:  Patient Active Problem List  Diagnosis  . PARESTHESIA  . 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 Acid reflux  . Ileus, postoperative  . Scrotal edema  . Acute urinary retention most likely secondary to scrotal edema and pelvic trauma  . Hyponatremia  . Unspecified vitamin D deficiency  . Right acetabular fracture   Past Medical History:  Past Medical History  Diagnosis Date  . Hypertension   . GERD (gastroesophageal reflux disease)    Past Surgical History:  Past Surgical History  Procedure Laterality Date  . Tonsillectomy    . External fixation leg Right 09/05/2012    Procedure: Application of traction bow externally;  Surgeon: Leslie Pal, MD;  Location: [facility] OR;  Service: Orthopedics;  Laterality: Right;  . Orif acetabular fracture Right 09/08/2012    Procedure: OPEN REDUCTION INTERNAL FIXATION (ORIF) ACETABULAR FRACTURE;  Surgeon: Leslie Palmer, MD;  Location: [facility] OR;  Service: Orthopedics;  Laterality: Right;   Social History:  reports that he has never smoked. He has never used smokeless tobacco. He reports that he does not drink alcohol or use illicit drugs.  Family / Support Systems Marital Status: Married How Long?: 35 yrs Patient Roles: Spouse;Parent (Has 1 son.) Spouse/Significant Other: wife, Leslie Cox @ [phone] Children: son, Leslie Cox - local and working f/t (at same [employer] as pt.) Other Supports: pt's sister-in-law, Leslie Cox @ [phone] Anticipated Caregiver: wife Ability/Limitations of Caregiver: Wife retired and not working, can Haematologist. Medical laboratory scientific officer: 24/7 Family Dynamics: Wife and pt note their son and wife's sister are very supportive.  Sister-in-law has planned that pt and wife will stay at her home after d/c so that they can  assist as needed, too.  Social History Preferred language: English Religion: Catholic Cultural Background: NA Education: HS Read: Yes Write: Yes Employment Status: Employed Name of Employer: Working f/t with [employer] and p/t with [employer] Return to Work Plans: fully intends to return to work as long as he is able to manage this physically. Legal Hisotry/Current Legal Issues: None Guardian/Conservator: None   Abuse/Neglect Physical Abuse: Denies Verbal Abuse: Denies Sexual Abuse: Denies Exploitation of patient/patient's resources: Denies Self-Neglect: Denies  Emotional Status Pt's affect, behavior adn adjustment status: Pt very pleasant, talkative throughout interview.  With some questions, however, he appears to pause and look to his sister-in-law for help in answering.  He admits some frustration with limitations and general circumstances, however, denies any significant emotional distress.  Will monitor as team reports he does appear to have some distraction during sessions which may be related to anxiety. 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  Wife managing home. Anticipated changes in roles/activities/participation: wife to temporarily assume caregiver role until pt regains independence.  Other family to assist intermittently. Pt/family expectations/goals: Pt hopeful he can reach an independent level  [employer]: None Premorbid Home Care/DME Agencies: None Transportation available at discharge: yes  Discharge Planning Living Arrangements: Spouse/significant other Support  Systems: Stage manager;Other relatives Type of  Residence: Private residence Insurance Resources: Media planner (specify) ([employer] of [place]) Financial Resources: Employment Financial Screen Referred: No Living Expenses: Database administrator Management: Patient Do you have any problems obtaining your medications?: No Home Management: wife Patient/Family Preliminary Plans: Pt and wife plan to stay with wife's sister after d/c "for a few weeks" Social Work Anticipated Follow Up Needs: HH/OP Expected length of stay: 2 weeks  Clinical Impression Pleasant, oriented, talkative gentleman here after fall and hip fx.  Team reports possible anxiety the first couple of days with tx sessions.  ?pain related.  Will monitor emotional adjustment as we proceed.  Refer to neuropsych if warranted.  Family able to provide 24/7 assistance upon d/c.  No other concerns at this time.  Leslie Cox 09/16/2012, 4:42 PM

## 2012-09-16 NOTE — Progress Notes (Signed)
Physical Therapy Session Note  Patient Details  Name: Leslie Cox MRN: [id number] Date of Birth: 06-27-1946  Today's Date: 09/16/2012 Time: 0830-0930 Time Calculation (min): 60 min  Short Term Goals: Week 1:  PT Short Term Goal 1 (Week 1): = LTGs  Skilled Therapeutic Interventions/Progress Updates:    Min A (for RLE) for bed mobility and dynamic standing for lower body dressing with steady A. Transferred with RW to w/c with cues for advancing RW and gait pattern. W/c parts management education for leg rest management with S. W/c propulsion on unit with S for endurance and UE strengthening > 150'. Nustep on level 3 x 10 min for ROM, endurance, and strengthening; using LLE and bilateral UE's to do the work so RLE with PROM only to maintain WB status. Gait training with RW x 25' with min A; difficulty coordinating gait pattern requiring mod cues. Transferred to recliner back in room to stay OOB with LE's elevated.   Therapy Documentation Precautions:  Precautions Precautions: Fall Restrictions Weight Bearing Restrictions: Yes RLE Weight Bearing: Touchdown weight bearing   Pain: Pain Assessment Pain Assessment: 0-10 Pain Score:   4 Pain Type: Surgical pain Pain Location: Abdomen Pain Orientation: Mid Pain Descriptors: Aching Pain Frequency: Intermittent Pain Onset: Gradual Patients Stated Pain Goal: 3 Pain Intervention(s): Medication (See eMAR);Repositioned Multiple Pain Sites: No  See FIM for current functional status  Therapy/Group: Individual Therapy  Karolee Stamps [facility] 09/16/2012, 9:52 AM

## 2012-09-16 NOTE — Progress Notes (Signed)
Social Work Patient ID: Leslie Cox, male   DOB: [birth year], 67 y.o.   MRN: [id number]  Have reviewed team conference today with pt, wife and her sister.  All agreeable with targeted d/c 3/27 with mod i goals.  Confirmed that plan is for pt and wife to stay at her sister's home "for a few weeks" upon d/c where more help is available.  No concerns at this time.  Linh Johannes, LCSW

## 2012-09-16 NOTE — Progress Notes (Signed)
Occupational Therapy Session Note  Patient Details  Name: Leslie Cox MRN: [id number] Date of Birth: [birth year]  Today's Date: 09/16/2012 Time: 1005-1100 Time Calculation (min): 55 min  Short Term Goals: Week 1:  OT Short Term Goal 1 (Week 1): Patient will complete UB/LB bathing with supervision OT Short Term Goal 2 (Week 1): Patient will complete UB dressing with supervision/set-up assist OT Short Term Goal 3 (Week 1): Patient will complete LB dressing with supervision/set-up assist OT Short Term Goal 4 (Week 1): Patient will ambulate into bathroom using rolling walker with  minimal assistance for toilet transfer OT Short Term Goal 5 (Week 1): Patient will perform at least 2 grooming tasks standing at sink with supervision  Skilled Therapeutic Interventions/Progress Updates:  Patient found seated in recliner beside bed. Patient engaged in functional ambulation/mobility from recliner -> gather clothes for ADL -> w/c set-up in front of sink. Patient then engaged in UB/LB bathing & dressing and grooming tasks at sink in sit<>stand position. Focused skilled intervention on use of AE to help increase independence with LB ADLs, sit<>stands, dynamic standing balance/tolerance/endurance, overall activity tolerance/endurance, management of RLE, problem solving throughout tasks, and overall safety awareness. At end of session left patient seated in w/c beside bed with call bell & phone within reach.   Precautions:  Precautions Precautions: Fall Restrictions Weight Bearing Restrictions: Yes RLE Weight Bearing: Touchdown weight bearing  See FIM for current functional status  Therapy/Group: Individual Therapy  Ismar Yabut 09/16/2012, 11:04 AM

## 2012-09-17 ENCOUNTER — Inpatient Hospital Stay (HOSPITAL_COMMUNITY): Payer: BC Managed Care – PPO

## 2012-09-17 ENCOUNTER — Inpatient Hospital Stay (HOSPITAL_COMMUNITY): Payer: BC Managed Care – PPO | Admitting: Occupational Therapy

## 2012-09-17 MED ORDER — SENNOSIDES-DOCUSATE SODIUM 8.6-50 MG PO TABS
2.0000 | ORAL_TABLET | Freq: Every day | ORAL | Status: DC
  Administered 2012-09-17 – 2012-09-23 (×7): 2 via ORAL
  Filled 2012-09-17 (×7): qty 2

## 2012-09-17 NOTE — Progress Notes (Addendum)
Physical Therapy Session Note  Patient Details  Name: Leslie Cox MRN: [id number] Date of Birth: 1945/10/07  Today's Date: 09/17/2012  Short Term Goals: Week 1:  PT Short Term Goal 1 (Week 1): = LTGs  Session #1 Time: 1345-1430 Time Calculation (min): 45 min   Pt's wife present and initiated some family education focusing on basic transfers, safety with RW, WB restrictions, stair training (going up backwards with RW 2 steps without rails) with wife return demonstrating technique (min A), and simulated car transfer with min A (due to not having leg lifter present to manage RLE).  Gait with RW for endurance and strengthening with S on unit.  Session #2 Time: 1530-1600 (30 min) Session focused on endurance,strengthening, and ROM on Nustep on level 3 x 12 min modified to maintain TDWB status on RLE and w/c mobility on unit and in room to navigate tight space. Pt also practiced legrest management to set up for transfers and completed with S.  Therapy Documentation Precautions:  Precautions Precautions: Fall Restrictions Weight Bearing Restrictions: Yes RLE Weight Bearing: Touchdown weight bearing Pain:  Premedicated for pain.   Locomotion : Ambulation Ambulation/Gait Assistance: 5: Supervision   See FIM for current functional status  Therapy/Group: Individual Therapy  Karolee Stamps [facility] 09/17/2012, 2:48 PM

## 2012-09-17 NOTE — Progress Notes (Signed)
Occupational Therapy Session Note  Patient Details  Name: Leslie Cox MRN: [id number] Date of Birth: 10-20-1945  Today's Date: 09/17/2012 Time: 1105-1200 Time Calculation (min): 55 min  Short Term Goals: Week 1:  OT Short Term Goal 1 (Week 1): Patient will complete UB/LB bathing with supervision OT Short Term Goal 2 (Week 1): Patient will complete UB dressing with supervision/set-up assist OT Short Term Goal 3 (Week 1): Patient will complete LB dressing with supervision/set-up assist OT Short Term Goal 4 (Week 1): Patient will ambulate into bathroom using rolling walker with  minimal assistance for toilet transfer OT Short Term Goal 5 (Week 1): Patient will perform at least 2 grooming tasks standing at sink with supervision  Skilled Therapeutic Interventions/Progress Updates:  Patient found seated in w/c. Patient maneuvered w/c -> sink for ADL retraining at sink level. Patient then performed grooming tasks seated at sink and UB/LB bathing & dressing in sit<>stand position from sink. Focused skilled intervention on sit<>stands, dynamic standing balance/tolerance/endurance, UB/LB bathing & dressing, grooming tasks, use of AE to increase independence, and overall activity tolerance/endurance. At end of session left patient seated in w/c beside bed with call bell & phone within reach.   Precautions:  Precautions Precautions: Fall Restrictions Weight Bearing Restrictions: Yes RLE Weight Bearing: Touchdown weight bearing  See FIM for current functional status  Therapy/Group: Individual Therapy  Betsy Rosello 09/17/2012, 12:11 PM

## 2012-09-17 NOTE — Progress Notes (Signed)
Subjective/Complaints: Moved bowels last night. Pain improving. Had a fair night. Still anxious A 12 point review of systems has been performed and if not noted above is otherwise negative.   Objective: Vital Signs: Blood pressure 146/72, pulse 94, temperature 98 F (36.7 C), temperature source Oral, resp. rate 18, height 5\' 7"  (1.702 m), weight 54.386 kg (119 lb 14.4 oz), SpO2 100.00%. No results found.  Recent Labs  09/14/12 1707 09/15/12 0545  WBC 8.5 7.4  HGB 9.4* 10.0*  HCT 27.0* 28.7*  PLT 457* 459*    Recent Labs  09/14/12 1707 09/15/12 0545  NA  --  137  K  --  4.0  CL  --  100  GLUCOSE  --  121*  BUN  --  6  CREATININE 0.44* 0.51  CALCIUM  --  8.8   CBG (last 3)  No results found for this basename: GLUCAP,  in the last 72 hours  Wt Readings from Last 3 Encounters:  09/16/12 54.386 kg (119 lb 14.4 oz)  09/08/12 82.9 kg (182 lb 12.2 oz)  09/08/12 82.9 kg (182 lb 12.2 oz)    Physical Exam:  Blood pressure 164/86, pulse 105, temperature 98.7 F (37.1 C), temperature source Oral, resp. rate 18, height 5\' 7"  (1.702 m), weight 82.9 kg (182 lb 12.2 oz), SpO2 92.00%.  Vitals reviewed.  HENT: oral mucosa pink and moist  Head: Normocephalic.  Eyes: EOM are normal.  Neck: Neck supple. No thyromegaly present.  Cardiovascular: Normal rate and regular rhythm. No murmurs or gallops  Pulmonary/Chest: Effort normal and breath sounds normal. No respiratory distress.  Abdominal: Nontender. Bowel sounds are distant. Abdomen is  Only mildly distended. Neurological:  Patient is still   Anxious, sometimes a little irritable. More cooperative. Participated with all parts of exam. Patient was alert and oriented x3. He can follow 3 step commands. UE's grossly 4/5. RLE 1-2/5 proximal to 4/5 distal. LLE 3/5 prox to 4/5 distally. No sensory or CN deficits.  Skin:  Surgical sites dressed with minimal to no drainage. Pelvis/left leg appropriately tender.   Assessment/Plan: 1.  Functional deficits secondary tocomplex acetabular fx s/p ORIF  which require 3+ hours per day of interdisciplinary therapy in a comprehensive inpatient rehab setting. Physiatrist is providing close team supervision and 24 hour management of active medical problems listed below. Physiatrist and rehab team continue to assess barriers to discharge/monitor patient progress toward functional and medical goals. FIM: FIM - Bathing Bathing Steps Patient Completed: Chest;Right Arm;Left Arm;Abdomen;Front perineal area;Buttocks;Right upper leg;Left upper leg Bathing: 4: Min-Patient completes 8-9 45f 10 parts or 75+ percent  FIM - Upper Body Dressing/Undressing Upper body dressing/undressing steps patient completed: Thread/unthread right sleeve of pullover shirt/dresss;Thread/unthread left sleeve of pullover shirt/dress;Put head through opening of pull over shirt/dress;Pull shirt over trunk Upper body dressing/undressing: 5: Set-up assist to: Obtain clothing/put away FIM - Lower Body Dressing/Undressing Lower body dressing/undressing steps patient completed: Thread/unthread right pants leg;Thread/unthread left pants leg;Pull pants up/down;Don/Doff right shoe;Fasten/unfasten right shoe Lower body dressing/undressing: 3: Mod-Patient completed 50-74% of tasks  FIM - Toileting Toileting steps completed by patient: Adjust clothing prior to toileting;Performs perineal hygiene;Adjust clothing after toileting Toileting Assistive Devices: Grab bar or rail for support Toileting: 0: Activity did not occur  FIM - Diplomatic Services operational officer Devices: Therapist, music Transfers: 0-Activity did not occur  FIM - Banker Devices: Environmental consultant;Bed rails Bed/Chair Transfer: 4: Supine > Sit: Min A (steadying Pt. > 75%/lift 1 leg);4: Bed > Chair  or W/C: Min A (steadying Pt. > 75%)  FIM - Locomotion: Wheelchair Locomotion: Wheelchair: 5: Travels 150 ft or more: maneuvers  on rugs and over door sills with supervision, cueing or coaxing FIM - Locomotion: Ambulation Locomotion: Ambulation Assistive Devices: Designer, industrial/product Ambulation/Gait Assistance: 4: Min assist Locomotion: Ambulation: 1: Travels less than 50 ft with minimal assistance (Pt.>75%)  Comprehension Comprehension Mode: Auditory Comprehension: 5-Follows basic conversation/direction: With extra time/assistive device  Expression Expression Mode: Verbal Expression: 5-Expresses basic needs/ideas: With extra time/assistive device  Social Interaction Social Interaction: 5-Interacts appropriately 90% of the time - Needs monitoring or encouragement for participation or interaction.  Problem Solving Problem Solving Mode: Not assessed Problem Solving: 6-Solves complex problems: With extra time  Memory Memory Mode: Not assessed Memory: 6-More than reasonable amt of time  Medical Problem List and Plan:  1. right acetabular fracture. Status post ORIF 09/08/2012  2. DVT Prophylaxis/Anticoagulation: Subcutaneous Lovenox. Check Doppler studies  3. Pain Management: Hydrocodone/Robaxin as needed. Monitor with increased activity   -added low dose oxycontin 10mg  q12 which has helped 4. Neuropsych: This patient is capable of making decisions on his/her own behalf.   -needs a lot of positive reinforcement 5. Postoperative anemia. Continue iron supplement. Followup CBC  6. Postoperative ileus. Advanced diet. Continue Reglan 3 times a day.  .    -softener/laxative daily 7. Urinary retention. Urecholine- decreased to 10mg  tid. Check PVRs x3. Urine study 09/11/2012 negative  LOS (Days) 3 A FACE TO FACE EVALUATION WAS PERFORMED  Leslie Cox,Leslie Cox 09/17/2012 8:52 AM

## 2012-09-17 NOTE — Progress Notes (Signed)
Physical Therapy Session Note  Patient Details  Name: Leslie Cox MRN: [id number] Date of Birth: Aug 10, 1945  Today's Date: 09/17/2012 Time: 0930-1028 Time Calculation (min): 58 min  Short Term Goals: Week 1:  PT Short Term Goal 1 (Week 1): = LTGs  Skilled Therapeutic Interventions/Progress Updates:    Initially worked on lower body dressing to put on shorts and required assist for shoes and tedhose; S for sit to stand and standing balance to pull up pants. W/c propulsion down to gym with S for endurance and strengthening. Focused on set up of w/c parts in preparation for transfers and then gait training on unit with RW with overall S multiple bouts for endurance and strengthening; pt did not require cues today for gait sequence/pattern. Practiced transfers and bed mobility in ADL apartment with S using leg lifter to manage RLE (increased pain with sit to supine, but able to complete with encouragement and time). Seated therex for LAQ x 10 reps x 3 sets with 5 second hold bilaterally.   Therapy Documentation Precautions:  Precautions Precautions: Fall Restrictions Weight Bearing Restrictions: Yes RLE Weight Bearing: Touchdown weight bearing  Pain: C/o 5 /10 pain - premedicated for RLE  See FIM for current functional status  Therapy/Group: Individual Therapy  Karolee Stamps [facility] 09/17/2012, 12:06 PM

## 2012-09-18 ENCOUNTER — Inpatient Hospital Stay (HOSPITAL_COMMUNITY): Payer: BC Managed Care – PPO | Admitting: Physical Therapy

## 2012-09-18 ENCOUNTER — Inpatient Hospital Stay (HOSPITAL_COMMUNITY): Payer: BC Managed Care – PPO | Admitting: *Deleted

## 2012-09-18 DIAGNOSIS — S32409A Unspecified fracture of unspecified acetabulum, initial encounter for closed fracture: Secondary | ICD-10-CM

## 2012-09-18 DIAGNOSIS — W1789XA Other fall from one level to another, initial encounter: Secondary | ICD-10-CM

## 2012-09-18 NOTE — Progress Notes (Signed)
Physical Therapy Session Note  Patient Details  Name: Leslie Cox MRN: [id number] Date of Birth: [birth year]  Today's Date: 09/18/2012 Time: 4098-1191 Time Calculation (min): 44 min  Skilled Therapeutic Interventions/Progress Updates:    Initiated session by doing 10 reps of each exercise of Rt. LE HEP. Pt needed mod verbal cues to perform correctly. Pt required use of sheet under knee to assist with heel slides during the activity. Encouraged pt to do exercises 1 x daily once in bed after therapies.  Bed mobility with supervision cues for efficiency and pain modulation, increased time. Pt directing wheelchair set up for transfer out of bed with min verbal cues. Stand pivot transfer with min-guard assist. Ambulation x 120' over tile and carpeted surfaces with RW and min-guard assist, pt only needed one verbal cue for sequencing however he has to stop at times and regroup to remember sequencing by self.   Therapy Documentation Precautions:  Precautions Precautions: Fall Restrictions Weight Bearing Restrictions: Yes RLE Weight Bearing: Touchdown weight bearing  Pain: 7/10, pt reports he takes scheduled medication and did not want to call for any. Nurse tech made aware of pain.  See FIM for current functional status  Therapy/Group: Individual Therapy  Wilhemina Bonito 09/18/2012, 12:07 PM

## 2012-09-18 NOTE — Progress Notes (Signed)
Subjective/Complaints: Anxious, but admits to improvement A 12 point review of systems has been performed and if not noted above is otherwise negative.   Objective: Vital Signs: Blood pressure 123/78, pulse 88, temperature 97.5 F (36.4 C), temperature source Oral, resp. rate 19, height 5\' 7"  (1.702 m), weight 54.386 kg (119 lb 14.4 oz), SpO2 97.00%. No results found. No results found for this basename: WBC, HGB, HCT, PLT,  in the last 72 hours No results found for this basename: NA, K, CL, CO, GLUCOSE, BUN, CREATININE, CALCIUM,  in the last 72 hours CBG (last 3)  No results found for this basename: GLUCAP,  in the last 72 hours  Wt Readings from Last 3 Encounters:  09/16/12 54.386 kg (119 lb 14.4 oz)  09/08/12 82.9 kg (182 lb 12.2 oz)  09/08/12 82.9 kg (182 lb 12.2 oz)    Physical Exam:  Blood pressure 164/86, pulse 105, temperature 98.7 F (37.1 C), temperature source Oral, resp. rate 18, height 5\' 7"  (1.702 m), weight 82.9 kg (182 lb 12.2 oz), SpO2 92.00%.  Vitals reviewed.  HENT: oral mucosa pink and moist  Head: Normocephalic.  Eyes: EOM are normal.  Neck: Neck supple. No thyromegaly present.  Cardiovascular: Normal rate and regular rhythm. No murmurs or gallops  Pulmonary/Chest: Effort normal and breath sounds normal. No respiratory distress.  Abdominal: Nontender. Bowel sounds are distant. Abdomen is  Only mildly distended. Neurological:  Patient is still   Anxious, sometimes a little irritable. More cooperative. Participated with all parts of exam. Patient was alert and oriented x3. He can follow 3 step commands. UE's grossly 4/5. RLE 1-2/5 proximal to 4/5 distal. LLE 3/5 prox to 4/5 distally. No sensory or CN deficits.  Skin:  Surgical sites dressed with   no drainage. Pelvis/left leg appropriately tender.   Assessment/Plan: 1. Functional deficits secondary tocomplex acetabular fx s/p ORIF  which require 3+ hours per day of interdisciplinary therapy in a comprehensive  inpatient rehab setting. Physiatrist is providing close team supervision and 24 hour management of active medical problems listed below. Physiatrist and rehab team continue to assess barriers to discharge/monitor patient progress toward functional and medical goals. FIM: FIM - Bathing Bathing Steps Patient Completed: Chest;Right Arm;Left Arm;Abdomen;Front perineal area;Buttocks;Right upper leg;Left upper leg Bathing: 4: Min-Patient completes 8-9 31f 10 parts or 75+ percent  FIM - Upper Body Dressing/Undressing Upper body dressing/undressing steps patient completed: Thread/unthread right sleeve of pullover shirt/dresss;Thread/unthread left sleeve of pullover shirt/dress;Put head through opening of pull over shirt/dress;Pull shirt over trunk Upper body dressing/undressing: 5: Set-up assist to: Obtain clothing/put away FIM - Lower Body Dressing/Undressing Lower body dressing/undressing steps patient completed: Thread/unthread right pants leg;Thread/unthread left pants leg;Pull pants up/down;Don/Doff right sock;Don/Doff left shoe Lower body dressing/undressing: 4: Steadying Assist  FIM - Toileting Toileting steps completed by patient: Adjust clothing prior to toileting;Performs perineal hygiene;Adjust clothing after toileting Toileting Assistive Devices: Grab bar or rail for support Toileting: 0: Activity did not occur  FIM - Diplomatic Services operational officer Devices: Grab bars Toilet Transfers: 0-Activity did not occur  FIM - Banker Devices: Walker (leg lifter) Bed/Chair Transfer: 5: Supine > Sit: Supervision (verbal cues/safety issues);5: Sit > Supine: Supervision (verbal cues/safety issues);5: Bed > Chair or W/C: Supervision (verbal cues/safety issues);5: Chair or W/C > Bed: Supervision (verbal cues/safety issues)  FIM - Locomotion: Wheelchair Locomotion: Wheelchair: 5: Travels 150 ft or more: maneuvers on rugs and over door sills with  supervision, cueing or coaxing FIM - Locomotion: Ambulation Locomotion:  Ambulation Assistive Devices: Walker - Rolling Ambulation/Gait Assistance: 5: Supervision Locomotion: Ambulation: 2: Travels 50 - 149 ft with supervision/safety issues  Comprehension Comprehension Mode: Auditory Comprehension: 5-Understands complex 90% of the time/Cues < 10% of the time  Expression Expression Mode: Verbal Expression: 5-Expresses complex 90% of the time/cues < 10% of the time  Social Interaction Social Interaction: 5-Interacts appropriately 90% of the time - Needs monitoring or encouragement for participation or interaction.  Problem Solving Problem Solving Mode: Not assessed Problem Solving: 5-Solves complex 90% of the time/cues < 10% of the time  Memory Memory Mode: Not assessed Memory: 6-More than reasonable amt of time  Medical Problem List and Plan:  1. right acetabular fracture. Status post ORIF 09/08/2012  2. DVT Prophylaxis/Anticoagulation: Subcutaneous Lovenox. Dopplers negative 3. Pain Management: Hydrocodone/Robaxin as needed. Monitor with increased activity   -added low dose oxycontin 10mg  q12 which has helped 4. Neuropsych: This patient is capable of making decisions on his/her own behalf.   -needs a lot of positive reinforcement 5. Postoperative anemia. Continue iron supplement. Followup CBC  6. Postoperative ileus. Advanced diet. Continue Reglan 3 times a day.  .    -softener/laxative daily  -no bm thursday 7. Urinary retention. Urecholine-  Wean to off. Check PVRs x3. Urine study 09/11/2012 negative  LOS (Days) 4 A FACE TO FACE EVALUATION WAS PERFORMED  Nitasha Jewel T 09/18/2012 9:33 AM

## 2012-09-18 NOTE — Progress Notes (Signed)
Occupational Therapy Session Note  Patient Details  Name: Leslie Cox MRN: [id number] Date of Birth: 12-02-1945  Today's Date: 09/18/2012 Time:  - 1000-1100  (60 min)  1st session    Short Term Goals: Week 1:  OT Short Term Goal 1 (Week 1): Patient will complete UB/LB bathing with supervision OT Short Term Goal 2 (Week 1): Patient will complete UB dressing with supervision/set-up assist OT Short Term Goal 3 (Week 1): Patient will complete LB dressing with supervision/set-up assist OT Short Term Goal 4 (Week 1): Patient will ambulate into bathroom using rolling walker with  minimal assistance for toilet transfer OT Short Term Goal 5 (Week 1): Patient will perform at least 2 grooming tasks standing at sink with supervision  Skilled Therapeutic Interventions/Progress Updates:     10:00-11:00 1st session:   Pain:  6/10 right hip.  See MAR  Individual session.   Pt engaged in bathing and dressing at shower level.    Addressed functional mobility with RW, standing tolerance, endurance, AE instruction and education and use, organizing and sequencing.  Pt propelled wc to shower area.  Transferred to bench with minimal assist and min. instructional cues.  Pt. Verbose throughout entire session.  Stood to wash periarea with minimal cues for organizing and sequencing.  Pt. Ambulated out to bed area with RW and min assist.  Washed body with supervison; dressed with minimal assist for balance only. Dressed with AE prn.    Second session:  1330-1430  (60 min)  6/10 pain right hip  See MAR.  Addressed wc mobility, furniture transfers, standing balance with dynamic and static moves.  Wife present.  Practiced transfers to sofa, dining chair, tub bench.  Pt did well needing on supervision with transfers, but minimal assist with leaning back on tub bench to get right leg over side of tub.  Educated pt on not bending hip to extreme positions.   At times, pt may put more than TDWB on RLE.  Cue pt to lighten up.   Pt.  Propelled wc back to room with supervision and increased time to turn.    3rd session:  1630-1715  (45 min)  5/10 right hip pain.  See MAR.  Propelled wc down to ADL apartment.  Instructed pt to make creamed potatoes.  Pt gathered supplies with minimal cues for energy conservation and passing items from one counter to another.  Pt. Recalled the 3 main ingredients in the recipe.  Pt. Took increased time to locate the measuring cups and supplies.  Decided to make the potatoes over the weekend if sessions permit.  Took pt back to room.     Therapy Documentation Precautions:  Precautions Precautions: Fall Restrictions Weight Bearing Restrictions: Yes RLE Weight Bearing: Touchdown weight bearing              See FIM for current functional status  Therapy/Group: Individual Therapy  Humberto Seals 09/18/2012, 11:12 AM

## 2012-09-19 ENCOUNTER — Inpatient Hospital Stay (HOSPITAL_COMMUNITY): Payer: BC Managed Care – PPO

## 2012-09-19 ENCOUNTER — Inpatient Hospital Stay (HOSPITAL_COMMUNITY): Payer: BC Managed Care – PPO | Admitting: Physical Therapy

## 2012-09-19 NOTE — Progress Notes (Signed)
Occupational Therapy Session Note  Patient Details  Name: Leslie Cox MRN: [id number] Date of Birth: 28-Apr-1946  Today's Date: 09/19/2012  Session 1 Time: 1100-1203 Time Calculation (min): 63 min  Short Term Goals: Week 1:  OT Short Term Goal 1 (Week 1): Patient will complete UB/LB bathing with supervision OT Short Term Goal 2 (Week 1): Patient will complete UB dressing with supervision/set-up assist OT Short Term Goal 3 (Week 1): Patient will complete LB dressing with supervision/set-up assist OT Short Term Goal 4 (Week 1): Patient will ambulate into bathroom using rolling walker with  minimal assistance for toilet transfer OT Short Term Goal 5 (Week 1): Patient will perform at least 2 grooming tasks standing at sink with supervision  Skilled Therapeutic Interventions/Progress Updates:    Pt seated in w/c with wife at side.  Pt declined bathing and dressing this morning.  Pt propelled to ADL apartment to prepare instant mashed potatoes.  Pt amb with RW in kitchen to gather supplies from refirgerator (milk and butter).  Pt demonstrated appropriate problems solving skills to move items from refrigerator to counter top in a safe manner.  Pt completed all steps of task appropriately including cleaning up work area at supervision level.  Pt issued walker bag and reacher bag for walker.  Pt demonstrated appropriate use of reacher to retrieve items from floor.  Focus on activity tolerance, dynamic standing balance, adherence to TDWB status, functional ambulation, and safety awareness.  Therapy Documentation Precautions:  Precautions Precautions: Fall Restrictions Weight Bearing Restrictions: Yes RLE Weight Bearing: Touchdown weight bearing General:   Vital Signs:   Pain: Pain Assessment Pain Score:  (no report of pain)  See FIM for current functional status  Therapy/Group: Individual Therapy  Session 2 Time: 1430-1500 Pt denies pain Individual therapy  Pt engaged in dynamic  standing activities using Wii to play bowling and tennis.  Pt required steady A while standing.  Pt maintained TDWB independently.  Lavone Neri [facility] 09/19/2012, 1:34 PM

## 2012-09-19 NOTE — Plan of Care (Signed)
Problem: RH BOWEL ELIMINATION Goal: RH STG MANAGE BOWEL WITH ASSISTANCE STG Manage Bowel with Modified Independence Assistance.  Outcome: Not Progressing Last bowel movement 09-16-12 will give miralax in the am Goal: RH STG MANAGE BOWEL W/MEDICATION W/ASSISTANCE STG Manage Bowel with Medication with Min Assistance.  Outcome: Not Progressing Last bowel movement 09-16-12 will give miralax in the am Goal: RH STG MANAGE BOWEL W/EQUIPMENT W/ASSISTANCE STG Manage Bowel With Equipment With Min Assistance with use of BSC  Outcome: Not Progressing Last bowel movement 09-16-12 will give miralax in the am

## 2012-09-19 NOTE — Progress Notes (Signed)
Physical Therapy Session Note  Patient Details  Name: Leslie Cox MRN: [id number] Date of Birth: 10-29-1945  Today's Date: 09/19/2012 Time: 0930-1015 Time Calculation (min): 45 min  Short Term Goals: Week 1:  PT Short Term Goal 1 (Week 1): = LTGs  Therapy Documentation Precautions:  Precautions: Fall Restrictions Weight Bearing Restrictions: Yes RLE Weight Bearing: Touchdown weight bearing Pain: Pain Assessment: 0-10 Pain Score:   6 and received pain meds ~ 30 minutes ago Pain Type: Surgical pain Pain Location: Hip Pain Orientation: Right Pain Descriptors: Sore Pain Onset: Gradual Pain Intervention(s): Medication (See eMAR)  Gait Training: (15') using RW 4 x 120' w/ S/Mod-I Therapeutic Exercise:(15') B LE's in sitting using 5# ankle weight on L LE Therapeutic Activity:(15') Transfer training sit<->stand multiple times with S/Mod-I still needing vc's for safety    See FIM for current functional status  Therapy/Group: Individual Therapy  Rex Kras 09/19/2012, 9:41 AM

## 2012-09-19 NOTE — Progress Notes (Signed)
Subjective/Complaints: A little less anxious this am. In better spirits.  A 12 point review of systems has been performed and if not noted above is otherwise negative.   Objective: Vital Signs: Blood pressure 125/67, pulse 86, temperature 97.7 F (36.5 C), temperature source Oral, resp. rate 20, height 5\' 7"  (1.702 m), weight 54.386 kg (119 lb 14.4 oz), SpO2 97.00%. No results found. No results found for this basename: WBC, HGB, HCT, PLT,  in the last 72 hours No results found for this basename: NA, K, CL, CO, GLUCOSE, BUN, CREATININE, CALCIUM,  in the last 72 hours CBG (last 3)  No results found for this basename: GLUCAP,  in the last 72 hours  Wt Readings from Last 3 Encounters:  09/16/12 54.386 kg (119 lb 14.4 oz)  09/08/12 82.9 kg (182 lb 12.2 oz)  09/08/12 82.9 kg (182 lb 12.2 oz)    Physical Exam:  Blood pressure 164/86, pulse 105, temperature 98.7 F (37.1 C), temperature source Oral, resp. rate 18, height 5\' 7"  (1.702 m), weight 82.9 kg (182 lb 12.2 oz), SpO2 92.00%.  Vitals reviewed.  HENT: oral mucosa pink and moist  Head: Normocephalic.  Eyes: EOM are normal.  Neck: Neck supple. No thyromegaly present.  Cardiovascular: Normal rate and regular rhythm. No murmurs or gallops  Pulmonary/Chest: Effort normal and breath sounds normal. No respiratory distress.  Abdominal: Nontender. Bowel sounds are distant. Abdomen is  Only mildly distended. Neurological:  Patient is still   Anxious, sometimes a little irritable. More cooperative. Participated with all parts of exam. Patient was alert and oriented x3. He can follow 3 step commands. UE's grossly 4/5. RLE 1-2/5 proximal to 4/5 distal. LLE 3/5 prox to 4/5 distally. No sensory or CN deficits.  Skin:  Surgical sites dressed with   no drainage. Pelvis/left leg appropriately tender.   Assessment/Plan: 1. Functional deficits secondary tocomplex acetabular fx s/p ORIF  which require 3+ hours per day of interdisciplinary therapy in  a comprehensive inpatient rehab setting. Physiatrist is providing close team supervision and 24 hour management of active medical problems listed below. Physiatrist and rehab team continue to assess barriers to discharge/monitor patient progress toward functional and medical goals. FIM: FIM - Bathing Bathing Steps Patient Completed: Chest;Right Arm;Left Arm;Abdomen;Front perineal area;Buttocks;Right upper leg;Left upper leg;Left lower leg (including foot) Bathing: 4: Min-Patient completes 8-9 21f 10 parts or 75+ percent  FIM - Upper Body Dressing/Undressing Upper body dressing/undressing steps patient completed: Thread/unthread right sleeve of pullover shirt/dresss;Thread/unthread left sleeve of pullover shirt/dress;Put head through opening of pull over shirt/dress;Pull shirt over trunk Upper body dressing/undressing: 5: Set-up assist to: Obtain clothing/put away FIM - Lower Body Dressing/Undressing Lower body dressing/undressing steps patient completed: Thread/unthread right pants leg;Thread/unthread left pants leg;Pull pants up/down;Don/Doff left sock;Don/Doff left shoe Lower body dressing/undressing: 4: Min-Patient completed 75 plus % of tasks  FIM - Toileting Toileting steps completed by patient: Adjust clothing prior to toileting;Performs perineal hygiene;Adjust clothing after toileting Toileting Assistive Devices: Grab bar or rail for support Toileting: 0: Activity did not occur  FIM - Diplomatic Services operational officer Devices: Therapist, music Transfers: 0-Activity did not occur  FIM - Banker Devices: Environmental consultant;Bed rails;Arm rests;HOB elevated Bed/Chair Transfer: 5: Supine > Sit: Supervision (verbal cues/safety issues);5: Sit > Supine: Supervision (verbal cues/safety issues);4: Bed > Chair or W/C: Min A (steadying Pt. > 75%);4: Chair or W/C > Bed: Min A (steadying Pt. > 75%)  FIM - Locomotion: Wheelchair Locomotion: Wheelchair: 5:  Travels 150 ft  or more: maneuvers on rugs and over door sills with supervision, cueing or coaxing FIM - Locomotion: Ambulation Locomotion: Ambulation Assistive Devices: Walker - Rolling Ambulation/Gait Assistance: 5: Supervision Locomotion: Ambulation: 2: Travels 50 - 149 ft with supervision/safety issues  Comprehension Comprehension Mode: Auditory Comprehension: 5-Understands complex 90% of the time/Cues < 10% of the time  Expression Expression Mode: Verbal Expression: 5-Expresses complex 90% of the time/cues < 10% of the time  Social Interaction Social Interaction: 5-Interacts appropriately 90% of the time - Needs monitoring or encouragement for participation or interaction.  Problem Solving Problem Solving Mode: Not assessed Problem Solving: 5-Solves complex 90% of the time/cues < 10% of the time  Memory Memory Mode: Not assessed Memory: 6-More than reasonable amt of time  Medical Problem List and Plan:  1. right acetabular fracture. Status post ORIF 09/08/2012  2. DVT Prophylaxis/Anticoagulation: Subcutaneous Lovenox. Dopplers negative 3. Pain Management: Hydrocodone/Robaxin as needed. Monitor with increased activity   -added low dose oxycontin 10mg  q12 which has helped 4. Neuropsych: This patient is capable of making decisions on his/her own behalf.   -needs a lot of positive reinforcement 5. Postoperative anemia. Continue iron supplement. Followup CBC  6. Postoperative ileus. Advanced diet. Continue Reglan 3 times a day.  .    -softener/laxative daily  -workking qd to qod bm's 7. Urinary retention. Urecholine-  Weaning off. Check PVRs x3. Urine study 09/11/2012 negative  LOS (Days) 5 A FACE TO FACE EVALUATION WAS PERFORMED  Ryder Man T 09/19/2012 8:42 AM

## 2012-09-19 NOTE — Progress Notes (Signed)
Physical Therapy Note  Patient Details  Name: Leslie Cox MRN: [id number] Date of Birth: [birth year] Today's Date: 09/19/2012  1300- 1355 (55 minutes) group Pain: no reported pain Pt participated in PT group session focused on gait training/safety/endurance. Treatment: Pt ambulates 80 feet X 2 RW SBA TDWB RT LE; up/down 2 steps with RW (backward) min assist with minimal vcs for technique.   Sallie Staron,JIM 09/19/2012, 2:07 PM

## 2012-09-20 ENCOUNTER — Inpatient Hospital Stay (HOSPITAL_COMMUNITY): Payer: BC Managed Care – PPO | Admitting: Physical Therapy

## 2012-09-20 NOTE — Progress Notes (Signed)
Subjective/Complaints: Pain improving,. No problems. Moved bowels yesterday.  A 12 point review of systems has been performed and if not noted above is otherwise negative.   Objective: Vital Signs: Blood pressure 117/74, pulse 96, temperature 98.5 F (36.9 C), temperature source Oral, resp. rate 20, height 5\' 7"  (1.702 m), weight 54.386 kg (119 lb 14.4 oz), SpO2 98.00%. No results found. No results found for this basename: WBC, HGB, HCT, PLT,  in the last 72 hours No results found for this basename: NA, K, CL, CO, GLUCOSE, BUN, CREATININE, CALCIUM,  in the last 72 hours CBG (last 3)  No results found for this basename: GLUCAP,  in the last 72 hours  Wt Readings from Last 3 Encounters:  09/16/12 54.386 kg (119 lb 14.4 oz)  09/08/12 82.9 kg (182 lb 12.2 oz)  09/08/12 82.9 kg (182 lb 12.2 oz)    Physical Exam:  Blood pressure 164/86, pulse 105, temperature 98.7 F (37.1 C), temperature source Oral, resp. rate 18, height 5\' 7"  (1.702 m), weight 82.9 kg (182 lb 12.2 oz), SpO2 92.00%.  Vitals reviewed.  HENT: oral mucosa pink and moist  Head: Normocephalic.  Eyes: EOM are normal.  Neck: Neck supple. No thyromegaly present.  Cardiovascular: Normal rate and regular rhythm. No murmurs or gallops  Pulmonary/Chest: Effort normal and breath sounds normal. No respiratory distress.  Abdominal: Nontender. Bowel sounds are distant. Abdomen is  Only mildly distended. Neurological:  Patient is still   Anxious, sometimes a little irritable. More cooperative. Participated with all parts of exam. Patient was alert and oriented x3. He can follow 3 step commands. UE's grossly 4/5. RLE 1-2/5 proximal to 4/5 distal. LLE 3/5 prox to 4/5 distally. No sensory or CN deficits.  Skin:  Surgical sites dressed with   no drainage. Pelvis/left leg appropriately tender. Psych: in good spirits today   Assessment/Plan: 1. Functional deficits secondary tocomplex acetabular fx s/p ORIF  which require 3+ hours per  day of interdisciplinary therapy in a comprehensive inpatient rehab setting. Physiatrist is providing close team supervision and 24 hour management of active medical problems listed below. Physiatrist and rehab team continue to assess barriers to discharge/monitor patient progress toward functional and medical goals.  Remove staples today  FIM: FIM - Bathing Bathing Steps Patient Completed: Chest;Right Arm;Left Arm;Abdomen;Front perineal area;Buttocks;Right upper leg;Left upper leg;Left lower leg (including foot) Bathing: 4: Min-Patient completes 8-9 33f 10 parts or 75+ percent  FIM - Upper Body Dressing/Undressing Upper body dressing/undressing steps patient completed: Thread/unthread right sleeve of pullover shirt/dresss;Thread/unthread left sleeve of pullover shirt/dress;Put head through opening of pull over shirt/dress;Pull shirt over trunk Upper body dressing/undressing: 5: Set-up assist to: Obtain clothing/put away FIM - Lower Body Dressing/Undressing Lower body dressing/undressing steps patient completed: Thread/unthread right pants leg;Thread/unthread left pants leg;Pull pants up/down;Don/Doff left sock;Don/Doff left shoe Lower body dressing/undressing: 4: Min-Patient completed 75 plus % of tasks  FIM - Toileting Toileting steps completed by patient: Adjust clothing prior to toileting;Adjust clothing after toileting Toileting Assistive Devices: Grab bar or rail for support Toileting: 4: Steadying assist  FIM - Diplomatic Services operational officer Devices: Best boy Transfers: 5-To toilet/BSC: Supervision (verbal cues/safety issues);5-From toilet/BSC: Supervision (verbal cues/safety issues)  FIM - Banker Devices: Walker;Bed rails;Arm rests Bed/Chair Transfer: 5: Bed > Chair or W/C: Supervision (verbal cues/safety issues);5: Chair or W/C > Bed: Supervision (verbal cues/safety issues)  FIM - Locomotion:  Wheelchair Locomotion: Wheelchair: 5: Travels 150 ft or more: maneuvers on rugs and over door  sills with supervision, cueing or coaxing FIM - Locomotion: Ambulation Locomotion: Ambulation Assistive Devices: Walker - Rolling Ambulation/Gait Assistance: 5: Supervision Locomotion: Ambulation: 2: Travels 50 - 149 ft with supervision/safety issues  Comprehension Comprehension Mode: Auditory Comprehension: 5-Understands complex 90% of the time/Cues < 10% of the time  Expression Expression Mode: Verbal Expression: 6-Expresses complex ideas: With extra time/assistive device  Social Interaction Social Interaction: 6-Interacts appropriately with others with medication or extra time (anti-anxiety, antidepressant).  Problem Solving Problem Solving Mode: Not assessed Problem Solving: 5-Solves basic 90% of the time/requires cueing < 10% of the time  Memory Memory Mode: Not assessed Memory: 6-More than reasonable amt of time  Medical Problem List and Plan:  1. right acetabular fracture. Status post ORIF 09/08/2012  2. DVT Prophylaxis/Anticoagulation: Subcutaneous Lovenox. Dopplers negative 3. Pain Management: Hydrocodone/Robaxin as needed. Monitor with increased activity   -added low dose oxycontin 10mg  q12 which has helped 4. Neuropsych: This patient is capable of making decisions on his/her own behalf.   -needs a lot of positive reinforcement 5. Postoperative anemia. Continue iron supplement. Followup CBC  6. Postoperative ileus. Advanced diet to regular. Continue Reglan 3 times a day.  .    -softener/laxative daily  -qod bm's 7. Urinary retention. Urecholine-  Weaning off. Check PVRs x3. Urine study 09/11/2012 negative  LOS (Days) 6 A FACE TO FACE EVALUATION WAS PERFORMED  SWARTZ,ZACHARY T 09/20/2012 9:03 AM

## 2012-09-20 NOTE — Progress Notes (Signed)
Physical Therapy Note  Patient Details  Name: Leslie Cox MRN: [id number] Date of Birth: Aug 30, 1945 Today's Date: 09/20/2012  1400-1440 (40 minutes) individual Pain: 3/10 RT hip/premedicated Focus of treatment: Bilateral LE AROM/strengthening Treatment: transfer RW SBA TDWB RT LE; sit to supine (mat) with leg loop right min assist with vcs for technique; bilateral AROM/strengthening in supine X 20 - heel slides (AA on right with sheet); ankle pumps, SAQs; supine to sit SBA using leg loop (mat); gait 100 feet RW SBA TDWB RT LE (3/4 distance to room).   Jeanae Whitmill,JIM 09/20/2012, 2:15 PM

## 2012-09-21 ENCOUNTER — Inpatient Hospital Stay (HOSPITAL_COMMUNITY): Payer: BC Managed Care – PPO

## 2012-09-21 ENCOUNTER — Inpatient Hospital Stay (HOSPITAL_COMMUNITY): Payer: BC Managed Care – PPO | Admitting: Occupational Therapy

## 2012-09-21 LAB — CREATININE, SERUM
GFR calc Af Amer: >90 mL/min (ref >90)
GFR calc non Af Amer: >90 mL/min (ref >90)

## 2012-09-21 NOTE — Plan of Care (Signed)
Problem: RH SKIN INTEGRITY Goal: RH STG SKIN FREE OF INFECTION/BREAKDOWN Patient will remain free from new skin breakdown and incision sites will be free from infection with min assist of caregiver  Outcome: Progressing No new skin breakdown. Monitoring pre-admit incision and skin tears

## 2012-09-21 NOTE — Progress Notes (Signed)
Physical Therapy Session Note  Patient Details  Name: Leslie Cox MRN: [id number] Date of Birth: 1946/06/12  Today's Date: 09/21/2012 Time: 1030-1128 Time Calculation (min): 58 min  Short Term Goals: Week 1:  PT Short Term Goal 1 (Week 1): = LTGs  Skilled Therapeutic Interventions/Progress Updates:   Focused on gait training with RW for endurance and strengthening on unit with S while maintaining WB status. Practiced bed mobility using leg lifter with S; recommended for pt to lay down to L side due to increased pain on R hip. Standing therex for RLE including hip abduction, hamstring curls, hip extension and standing marches x 3 sets of 10 reps each; AAROM needed for supine hip flexion and abduction. Propelled w/c back to room and maneuvered w/c parts mod I.  Therapy Documentation Precautions:  Precautions Precautions: Fall Restrictions Weight Bearing Restrictions: Yes RLE Weight Bearing: Touchdown weight bearing   Pain: C/o 8/10 pain in R hip - RN provided medication.   Locomotion : Ambulation Ambulation/Gait Assistance: 5: Supervision   See FIM for current functional status  Therapy/Group: Individual Therapy  Karolee Stamps [facility] 09/21/2012, 12:12 PM

## 2012-09-21 NOTE — Progress Notes (Signed)
Subjective/Complaints: Moved bowels again yesterday. Sutures out. No new issues.  A 12 point review of systems has been performed and if not noted above is otherwise negative.   Objective: Vital Signs: Blood pressure 134/68, pulse 92, temperature 98.1 F (36.7 C), temperature source Oral, resp. rate 18, height 5\' 7"  (1.702 m), weight 54.386 kg (119 lb 14.4 oz), SpO2 99.00%. No results found. No results found for this basename: WBC, HGB, HCT, PLT,  in the last 72 hours No results found for this basename: NA, K, CL, CO, GLUCOSE, BUN, CREATININE, CALCIUM,  in the last 72 hours CBG (last 3)  No results found for this basename: GLUCAP,  in the last 72 hours  Wt Readings from Last 3 Encounters:  09/16/12 54.386 kg (119 lb 14.4 oz)  09/08/12 82.9 kg (182 lb 12.2 oz)  09/08/12 82.9 kg (182 lb 12.2 oz)    Physical Exam:  Blood pressure 164/86, pulse 105, temperature 98.7 F (37.1 C), temperature source Oral, resp. rate 18, height 5\' 7"  (1.702 m), weight 82.9 kg (182 lb 12.2 oz), SpO2 92.00%.  Vitals reviewed.  HENT: oral mucosa pink and moist  Head: Normocephalic.  Eyes: EOM are normal.  Neck: Neck supple. No thyromegaly present.  Cardiovascular: Normal rate and regular rhythm. No murmurs or gallops  Pulmonary/Chest: Effort normal and breath sounds normal. No respiratory distress.  Abdominal: Nontender. Bowel sounds are distant. Abdomen is  Only mildly distended. Neurological:  Patient is still   Anxious, sometimes a little irritable. More cooperative. Participated with all parts of exam. Patient was alert and oriented x3. He can follow 3 step commands. UE's grossly 4/5. RLE 1-2/5 proximal to 4/5 distal. LLE 3/5 prox to 4/5 distally. No sensory or CN deficits.  Skin:  Surgical sites clean, sutures out. Psych: in good spirits today   Assessment/Plan: 1. Functional deficits secondary tocomplex acetabular fx s/p ORIF  which require 3+ hours per day of interdisciplinary therapy in a  comprehensive inpatient rehab setting. Physiatrist is providing close team supervision and 24 hour management of active medical problems listed below. Physiatrist and rehab team continue to assess barriers to discharge/monitor patient progress toward functional and medical goals.  Remove staples today  FIM: FIM - Bathing Bathing Steps Patient Completed: Chest;Right Arm;Left Arm;Abdomen;Front perineal area;Buttocks;Right upper leg;Left upper leg;Left lower leg (including foot) Bathing: 4: Min-Patient completes 8-9 3f 10 parts or 75+ percent  FIM - Upper Body Dressing/Undressing Upper body dressing/undressing steps patient completed: Thread/unthread right sleeve of pullover shirt/dresss;Thread/unthread left sleeve of pullover shirt/dress;Put head through opening of pull over shirt/dress;Pull shirt over trunk Upper body dressing/undressing: 5: Set-up assist to: Obtain clothing/put away FIM - Lower Body Dressing/Undressing Lower body dressing/undressing steps patient completed: Thread/unthread right pants leg;Thread/unthread left pants leg;Pull pants up/down;Don/Doff left sock;Don/Doff left shoe Lower body dressing/undressing: 4: Min-Patient completed 75 plus % of tasks  FIM - Toileting Toileting steps completed by patient: Adjust clothing prior to toileting;Adjust clothing after toileting Toileting Assistive Devices: Grab bar or rail for support Toileting: 4: Steadying assist  FIM - Diplomatic Services operational officer Devices: Best boy Transfers: 5-To toilet/BSC: Supervision (verbal cues/safety issues);5-From toilet/BSC: Supervision (verbal cues/safety issues)  FIM - Banker Devices: Walker;Bed rails;Arm rests Bed/Chair Transfer: 5: Bed > Chair or W/C: Supervision (verbal cues/safety issues);5: Chair or W/C > Bed: Supervision (verbal cues/safety issues)  FIM - Locomotion: Wheelchair Locomotion: Wheelchair: 5: Travels 150 ft  or more: maneuvers on rugs and over door sills with supervision, cueing or  coaxing FIM - Locomotion: Ambulation Locomotion: Ambulation Assistive Devices: Walker - Rolling Ambulation/Gait Assistance: 5: Supervision Locomotion: Ambulation: 2: Travels 50 - 149 ft with supervision/safety issues  Comprehension Comprehension Mode: Auditory Comprehension: 5-Understands complex 90% of the time/Cues < 10% of the time  Expression Expression Mode: Verbal Expression: 6-Expresses complex ideas: With extra time/assistive device  Social Interaction Social Interaction: 6-Interacts appropriately with others with medication or extra time (anti-anxiety, antidepressant).  Problem Solving Problem Solving Mode: Not assessed Problem Solving: 5-Solves basic 90% of the time/requires cueing < 10% of the time  Memory Memory Mode: Not assessed Memory: 6-More than reasonable amt of time  Medical Problem List and Plan:  1. right acetabular fracture. Status post ORIF 09/08/2012  2. DVT Prophylaxis/Anticoagulation: Subcutaneous Lovenox. Dopplers negative 3. Pain Management: Hydrocodone/Robaxin as needed. Monitor with increased activity   -added low dose oxycontin 10mg  q12 which has helped 4. Neuropsych: This patient is capable of making decisions on his/her own behalf.   -needs a lot of positive reinforcement  -anxiety seems to be improving with better clinical state 5. Postoperative anemia. Continue iron supplement. Followup CBC  6. Postoperative ileus. Advanced diet to regular. Continue Reglan 3 times a day.  .    -softener/laxative daily  -qd to qod bm's 7. Urinary retention. Urecholine-  Weaning off. Check PVRs x3. Urine study 09/11/2012 negative  LOS (Days) 7 A FACE TO FACE EVALUATION WAS PERFORMED  SWARTZ,ZACHARY T 09/21/2012 8:23 AM

## 2012-09-21 NOTE — Progress Notes (Signed)
Occupational Therapy Session Notes  Patient Details  Name: Leslie Cox MRN: [id number] Date of Birth: [birth year]  Today's Date: 09/21/2012  Short Term Goals: Week 1:  OT Short Term Goal 1 (Week 1): Patient will complete UB/LB bathing with supervision OT Short Term Goal 2 (Week 1): Patient will complete UB dressing with supervision/set-up assist OT Short Term Goal 3 (Week 1): Patient will complete LB dressing with supervision/set-up assist OT Short Term Goal 4 (Week 1): Patient will ambulate into bathroom using rolling walker with  minimal assistance for toilet transfer OT Short Term Goal 5 (Week 1): Patient will perform at least 2 grooming tasks standing at sink with supervision  Skilled Therapeutic Interventions/Progress Updates:   Session #1 0905-1000 - 55 Minutes Individual Therapy No complaints of pain Patient found supine in bed. Patient engaged in bed mobility using leg lifter and sat edge of bed with supervision. Patient then ambulated into bathroom with rolling walker for toilet transfer on/off elevated toilet seat, then shower stall transfer on/off tub transfer bench. Patient used long handled sponge to increase independence with LB bathing. After shower patient ambulated -> bed for UB/LB dressing using AE prn to help increase independence. After dressing, patient stood at sink for grooming tasks. At end of session left patient seated in recliner with call bell & phone within reach.   Session #2 1430-1500 - 30 Minutes Individual Therapy No complaints of pain Patient found seated in w/c. Patient propelled self -> ADL apartment for simulated tub/shower transfer on/off tub transfer bench. Patient then ambulated -> therapy gym for UE exercise using SCIFIT machine in standing position. Patient then propelled self back to room. Therapist left patient seated in w/c to maneuver around room prn. Plan is for family education Wednesday, prior to d/c Thursday.   Precautions:   Precautions Precautions: Fall Restrictions Weight Bearing Restrictions: Yes RLE Weight Bearing: Touchdown weight bearing  See FIM for current functional status  Shatana Saxton 09/21/2012, 7:39 AM

## 2012-09-21 NOTE — Progress Notes (Signed)
Physical Therapy Session Note  Patient Details  Name: Leslie Cox MRN: [id number] Date of Birth: 10/17/1945  Today's Date: 09/21/2012 Time: 4098-1191 Time Calculation (min): 45 min  Short Term Goals: Week 1:  PT Short Term Goal 1 (Week 1): = LTGs  Skilled Therapeutic Interventions/Progress Updates:    family education with pt's wife for stair training for home entry going up backwards with RW up/down 2 steps; initially cues needed for which foot to lead with and walker placement, but able to return demonstrate successfully next attempt. Reviewed HEP with pt and wife and recommendations for follow up OPPT; both in agreement and verbalized understanding. Nustep for ROM/strengthening and endurance with modifications for WB status on level 3 x 10 min. Gait with RW on unit > 200' with S on carpeted and tiled surfaces with S.  Therapy Documentation Precautions:  Precautions Precautions: Fall Restrictions Weight Bearing Restrictions: Yes RLE Weight Bearing: Touchdown weight bearing  Pain: No complaints.   Locomotion : Ambulation Ambulation/Gait Assistance: 5: Supervision   See FIM for current functional status  Therapy/Group: Individual Therapy  Karolee Stamps [facility] 09/21/2012, 2:33 PM

## 2012-09-22 ENCOUNTER — Inpatient Hospital Stay (HOSPITAL_COMMUNITY): Payer: BC Managed Care – PPO | Admitting: *Deleted

## 2012-09-22 ENCOUNTER — Inpatient Hospital Stay (HOSPITAL_COMMUNITY): Payer: BC Managed Care – PPO

## 2012-09-22 ENCOUNTER — Inpatient Hospital Stay (HOSPITAL_COMMUNITY): Payer: BC Managed Care – PPO | Admitting: Occupational Therapy

## 2012-09-22 NOTE — Progress Notes (Signed)
Physical Therapy Session Note  Patient Details  Name: Leslie Cox MRN: [id number] Date of Birth: [birth year]  Today's Date: 09/22/2012 Time: 1130-1200 Time Calculation (min): 30 min  Short Term Goals: Week 1:  PT Short Term Goal 1 (Week 1): = LTGs  Skilled Therapeutic Interventions/Progress Updates:   Simulated car transfer with RW and using leg lifter to manage RLE mod I. Gait training on unit for endurance and strengthening > 150' with S; intermittent cueing for safety/gait pattern.   Therapy Documentation Precautions:  Precautions Precautions: Fall Restrictions Weight Bearing Restrictions: Yes RLE Weight Bearing: Touchdown weight bearing  Pain: C/o minimal hip pain on R - premedicated.    Locomotion : Ambulation Ambulation/Gait Assistance: 5: Supervision   See FIM for current functional status  Therapy/Group: Individual Therapy  Karolee Stamps [facility] 09/22/2012, 12:03 PM

## 2012-09-22 NOTE — Progress Notes (Signed)
Social Work Patient ID: Leslie Cox, male   DOB: 01-03-1946, 67 y.o.   MRN: [id number]  Have reviewed team conference info with patient and discussed arrangements being put in place for d/c.  Pt and family agreeable with recommendation that he do OPPT as this will be of more benefit.  No DME needs - he has all recommended DME.  To complete family education tomorrow.  Continue to follow.  Kamela Blansett, LCSW

## 2012-09-22 NOTE — Progress Notes (Signed)
Subjective/Complaints: Dry mouth. Some right hip pain. Otherwise doing well.  A 12 point review of systems has been performed and if not noted above is otherwise negative.   Objective: Vital Signs: Blood pressure 118/74, pulse 91, temperature 98.5 F (36.9 C), temperature source Oral, resp. rate 18, height 5\' 7"  (1.702 m), weight 54.386 kg (119 lb 14.4 oz), SpO2 96.00%. No results found. No results found for this basename: WBC, HGB, HCT, PLT,  in the last 72 hours  Recent Labs  09/21/12 0730  CREATININE 0.66   CBG (last 3)  No results found for this basename: GLUCAP,  in the last 72 hours  Wt Readings from Last 3 Encounters:  09/16/12 54.386 kg (119 lb 14.4 oz)  09/08/12 82.9 kg (182 lb 12.2 oz)  09/08/12 82.9 kg (182 lb 12.2 oz)    Physical Exam:  Blood pressure 164/86, pulse 105, temperature 98.7 F (37.1 C), temperature source Oral, resp. rate 18, height 5\' 7"  (1.702 m), weight 82.9 kg (182 lb 12.2 oz), SpO2 92.00%.  Vitals reviewed.  HENT: oral mucosa pink and moist  Head: Normocephalic.  Eyes: EOM are normal.  Neck: Neck supple. No thyromegaly present.  Cardiovascular: Normal rate and regular rhythm. No murmurs or gallops  Pulmonary/Chest: Effort normal and breath sounds normal. No respiratory distress.  Abdominal: Nontender. Bowel sounds are distant. Abdomen is  Only mildly distended. Neurological:  Patient is alert,  cooperative. Participated with all parts of exam. Patient was alert and oriented x3. He can follow 3 step commands. UE's grossly 4/5. RLE 1-2/5 proximal to 4/5 distal. LLE 3/5 prox to 4/5 distally. No sensory or CN deficits.  Skin:  Surgical sites clean, sutures out. Psych: in good spirits today   Assessment/Plan: 1. Functional deficits secondary tocomplex acetabular fx s/p ORIF  which require 3+ hours per day of interdisciplinary therapy in a comprehensive inpatient rehab setting. Physiatrist is providing close team supervision and 24 hour management  of active medical problems listed below. Physiatrist and rehab team continue to assess barriers to discharge/monitor patient progress toward functional and medical goals.  Staples out.   FIM: FIM - Bathing Bathing Steps Patient Completed: Chest;Right Arm;Left Arm;Abdomen;Front perineal area;Buttocks;Right upper leg;Left upper leg;Right lower leg (including foot);Left lower leg (including foot) Bathing: 5: Supervision: Safety issues/verbal cues  FIM - Upper Body Dressing/Undressing Upper body dressing/undressing steps patient completed: Thread/unthread right sleeve of pullover shirt/dresss;Thread/unthread left sleeve of pullover shirt/dress;Put head through opening of pull over shirt/dress;Pull shirt over trunk Upper body dressing/undressing: 5: Set-up assist to: Obtain clothing/put away FIM - Lower Body Dressing/Undressing Lower body dressing/undressing steps patient completed: Thread/unthread left pants leg;Pull pants up/down;Thread/unthread right pants leg;Don/Doff left sock;Don/Doff left shoe;Fasten/unfasten left shoe Lower body dressing/undressing: 5: Supervision: Safety issues/verbal cues  FIM - Toileting Toileting steps completed by patient: Adjust clothing prior to toileting;Adjust clothing after toileting Toileting Assistive Devices: Grab bar or rail for support Toileting: 3: Mod-Patient completed 2 of 3 steps  FIM - Diplomatic Services operational officer Devices: Elevated toilet seat;Bedside commode;Walker;Grab bars Toilet Transfers: 5-To toilet/BSC: Supervision (verbal cues/safety issues);5-From toilet/BSC: Supervision (verbal cues/safety issues)  FIM - Bed/Chair Transfer Bed/Chair Transfer Assistive Devices: Walker (leg lifter) Bed/Chair Transfer: 5: Supine > Sit: Supervision (verbal cues/safety issues);5: Bed > Chair or W/C: Supervision (verbal cues/safety issues)  FIM - Locomotion: Wheelchair Locomotion: Wheelchair: 5: Travels 150 ft or more: maneuvers on rugs and  over door sills with supervision, cueing or coaxing FIM - Locomotion: Ambulation Locomotion: Ambulation Assistive Devices: Designer, industrial/product Ambulation/Gait  Assistance: 5: Supervision Locomotion: Ambulation: 5: Travels 150 ft or more with supervision/safety issues  Comprehension Comprehension Mode: Auditory Comprehension: 6-Follows complex conversation/direction: With extra time/assistive device  Expression Expression Mode: Verbal Expression: 6-Expresses complex ideas: With extra time/assistive device  Social Interaction Social Interaction: 6-Interacts appropriately with others with medication or extra time (anti-anxiety, antidepressant).  Problem Solving Problem Solving Mode: Not assessed Problem Solving: 6-Solves complex problems: With extra time  Memory Memory Mode: Not assessed Memory: 6-More than reasonable amt of time  Medical Problem List and Plan:  1. right acetabular fracture. Status post ORIF 09/08/2012  2. DVT Prophylaxis/Anticoagulation: Subcutaneous Lovenox. Dopplers negative 3. Pain Management: Hydrocodone/Robaxin as needed. Monitor with increased activity   -added low dose oxycontin 10mg  q12 which has helped  -encouraged plenty of fluids, candy to help with dry mouth 4. Neuropsych: This patient is capable of making decisions on his/her own behalf.   -needs a lot of positive reinforcement  -anxiety seems to be improving with better clinical state 5. Postoperative anemia. Continue iron supplement. Followup CBC  6. Postoperative ileus. Advanced diet to regular. Continue Reglan 3 times a day.  .    -softener/laxative daily  -qd to qod bm's 7. Urinary retention. Urecholine-  Weaning off. Check PVRs x3. Urine study 09/11/2012 negative  LOS (Days) 8 A FACE TO FACE EVALUATION WAS PERFORMED  SWARTZ,ZACHARY T 09/22/2012 7:55 AM

## 2012-09-22 NOTE — Progress Notes (Signed)
Physical Therapy Discharge Summary  Patient Details  Name: Leslie Cox MRN: [id number] Date of Birth: 12/03/1945  Today's Date: 09/23/2012 Session #1 Time: 915-957 (42 min) Individual therapy; w/c mobility on unit mod I for endurance and strengthening down to therapy gym.Bed mobility on flat surface using leg lifter mod I. Stair training for home entry backwards with RW with emphasis on pt directing therapist how to assist (no cues needed). Reviewed and completed standing HEP therex for RLE strengthening including hip abduction, hip extension, hamstring curls, and seated LAQ x 10 reps x 2 sets each. Gait on unit with RW for endurance and strengthening mod I; no cues needed for WB status.   Session #2 Time: 1400-1458 (58 min) Individual therapy; no pain. Focused first half of session on family education with pt's wife and sister in law in regards to bed mobility, transfers, gait, and stair negotiation. Return demonstrated successfully. Practiced bed mobility on higher bed with leg lifter multiple reps and supine ROM to aid with bed mobility retraining. Gait back to room mod I with RW.   Patient has met 9 of 9 long term goals due to improved activity tolerance, improved balance, increased strength, decreased pain, ability to compensate for deficits, functional use of  right lower extremity, improved awareness and improved coordination.  Patient to discharge at an ambulatory level household Modified Independent with RW. Patient's famliy  is independent to provide the necessary physical assistance at discharge for stairs.  Reasons goals not met: n/a  Recommendation:  Patient will benefit from ongoing skilled PT services in home health setting to continue to advance safe functional mobility, address ongoing impairments in gait, balance, WB restrictions strength, endurance, and minimize fall risk.  Equipment: No equipment provided. Pt already has RW.  Reasons for discharge: treatment goals met and  discharge from hospital  Patient/family agrees with progress made and goals achieved: Yes  PT Discharge Precautions/Restrictions Precautions Precautions: Fall Restrictions RLE Weight Bearing: Touchdown weight bearing Other Position/Activity Restrictions: limit extreme end range motion in hip  Pain Pain Assessment Pain Assessment: No/denies pain Vision/Perception  Vision - History Baseline Vision: Wears glasses all the time  Cognition Overall Cognitive Status: Appears within functional limits for tasks assessed Safety/Judgment: Appears intact Sensation Sensation Light Touch: Appears Intact (slighlty decreased on RLE) Proprioception: Appears Intact Coordination Gross Motor Movements are Fluid and Coordinated: Yes Motor  Motor Motor: Within Functional Limits   Trunk/Postural Assessment  Cervical Assessment Cervical Assessment: Within Functional Limits Thoracic Assessment Thoracic Assessment: Within Functional Limits Lumbar Assessment Lumbar Assessment: Within Functional Limits  Balance Static Sitting Balance Static Sitting - Level of Assistance: 7: Independent Dynamic Sitting Balance Dynamic Sitting - Level of Assistance: 6: Modified independent (Device/Increase time) Static Standing Balance Static Standing - Level of Assistance: 6: Modified independent (Device/Increase time) Dynamic Standing Balance Dynamic Standing - Level of Assistance: 6: Modified independent (Device/Increase time) Extremity Assessment  RUE Assessment RUE Assessment: Within Functional Limits LUE Assessment LUE Assessment: Within Functional Limits RLE Assessment RLE Assessment: Exceptions to [facility] (hip 3-/5; knee and ankle WFL; pain limiting also in hip) LLE Assessment LLE Assessment: Within Functional Limits  See FIM for current functional status  Karolee Stamps [facility] 09/23/2012, 3:39 PM

## 2012-09-22 NOTE — Progress Notes (Addendum)
Physical Therapy Session Note  Patient Details  Name: Leslie Cox MRN: [id number] Date of Birth: March 01, 1946  Today's Date: 09/22/2012 Time: 1300-1355 Time Calculation (min): 55 min  Short Term Goals: Week 1:  PT Short Term Goal 1 (Week 1): = LTGs  Skilled Therapeutic Interventions/Progress Updates:   Pt participated in gait group with emphasis on dynamic gait with RW and dynamic standing balance activities with obstacle course negotiation, bowling activity for balance, maintaining TDWB status on RLE and gait on carpeted and tiled surfaces for endurance and strengthening. Seated and standing therex for LE strengthening to aid with endurance and gait during rest breaks. Propelled w/c to/from therapy for endurance and strengthening.  Therapy Documentation Precautions:  Precautions Precautions: Fall Restrictions Weight Bearing Restrictions: Yes RLE Weight Bearing: Touchdown weight bearing  Pain: No complaints.   Locomotion : Ambulation Ambulation/Gait Assistance: 5: Supervision   See FIM for current functional status  Therapy/Group: Co-Treatment and Group Therapy with Recreational therapy  Tedd Sias 09/22/2012, 2:17 PM

## 2012-09-22 NOTE — Patient Care Conference (Signed)
Inpatient RehabilitationTeam Conference and Plan of Care Update Date: 09/22/2012   Time: 2:15 PM    Patient Name: Leslie Cox      Medical Record Number: [id number]  Date of Birth: 1945/07/04 Sex: Male         Room/Bed: 4035/4035-01 Payor Info: Payor: BLUE CROSS BLUE SHIELD  Plan: BCBS [place] PPO  Product Type: *No Product type*     Admitting Diagnosis: rt acet fx  Admit Date/Time:  09/14/2012  4:44 PM Admission Comments: No comment available   Primary Diagnosis:  Right acetabular fracture Principal Problem: Right acetabular fracture  Patient Active Problem List   Diagnosis Date Noted  . Right acetabular fracture 09/15/2012  . Hyponatremia 09/14/2012  . Unspecified vitamin D deficiency 09/14/2012  . Ileus, postoperative 09/13/2012  . Scrotal edema 09/13/2012  . Acute urinary retention most likely secondary to scrotal edema and pelvic trauma 09/13/2012  . Acid reflux 09/07/2012  . 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 PARESTHESIA 03/16/2010    Expected Discharge Date: Expected Discharge Date: 09/24/12  Team Members Present: Physician leading conference: Dr. Faith Rogue Social Worker Present: Amada Jupiter, LCSW Nurse Present: Daryll Brod, RN PT Present: Karolee Stamps, PT;Other (comment) Sherrine Maples, PT) OT Present: Edwin Cap, OT Other (Discipline and Name): Tora Duck, PPS Coordinator     Current Status/Progress Goal Weekly Team Focus  Medical   pain control better. sutures out. bowels moving  see prior, anxiety  see prior   Bowel/Bladder   Continent of bowel and bladder. LBM 09/21/12. Uses urinal. staff empties  Continent of bowel and bladder  Pt to remain continent of bowel and bladder   Swallow/Nutrition/ Hydration             ADL's   overall supervision for BADLs  overall mod I -> supervision  ADL retraining, sit<>stands, dynamic standing, family education, RLE management, overall activity tolerance/endurance, d/c  planning   Mobility   S to steady A overall  mod I overall; min A 2 steps for home entry with RW  gait, stairs, family education, d/c planning, strengthening   Communication             Safety/Cognition/ Behavioral Observations            Pain   Scheduled Oxycodone 10mg  q 12hrs. Requesting prn pain medication intermittently  <3  Monitor effectiveness. Offer initial pain medication 1 hr prior to therapy session   Skin   R flank and lower abdominal incisional with steristrips intact. Abrasion x 2 to R thigh with tegaderm intact. Sacrum, pink/blanchable with Alleyn dressing to area  No additional skin breakdown  Monitor surgical incision, and skin abrasion for appropriate healing.    Rehab Goals Patient on target to meet rehab goals: Yes *See Interdisciplinary Assessment and Plan and progress notes for long and short-term goals  Barriers to Discharge: none new, prior being addressed    Possible Resolutions to Barriers:  see prior    Discharge Planning/Teaching Needs:  home with wife and sister-in-law to provide any needed assistance      Team Discussion:  Continues to make good gains.  Will complete family education tomorrow and on track for Thursday d/c.    Revisions to Treatment Plan:  None   Continued Need for Acute Rehabilitation Level of Care: The patient requires daily medical management by a physician with specialized training in physical medicine and rehabilitation for the following conditions: Daily direction of a multidisciplinary physical rehabilitation program to  ensure safe treatment while eliciting the highest outcome that is of practical value to the patient.: Yes Daily medical management of patient stability for increased activity during participation in an intensive rehabilitation regime.: Yes Daily analysis of laboratory values and/or radiology reports with any subsequent need for medication adjustment of medical intervention for : Post surgical  problems;Other  Ara Grandmaison 09/22/2012, 5:06 PM

## 2012-09-22 NOTE — Progress Notes (Signed)
Physical Therapy Session Note  Patient Details  Name: Leslie Cox MRN: [id number] Date of Birth: 1946/04/20  Today's Date: 09/22/2012 Time: 7829-5621 Time Calculation (min): 45 min  Short Term Goals: Week 1:  PT Short Term Goal 1 (Week 1): = LTGs  Skilled Therapeutic Interventions/Progress Updates:   W/c propulsion down to therapy gym mod I for endurance and strengthening of UEs. Bed mobility with leg lifter mod I and supine therex for LE strengthening including heel slides, SAQ, modified bridging, and hip abduction x 10 reps x 2 sets each. nustep modified for WB status x 5 min on level 4 for endurance, ROm, and strengthening. Overall S/mod I with basic transfers using RW.   Therapy Documentation Precautions:  Precautions Precautions: Fall Restrictions Weight Bearing Restrictions: Yes RLE Weight Bearing: Touchdown weight bearing    Pain:  premedicated - increases with mobility in R hip.   Locomotion : Ambulation Ambulation/Gait Assistance: 5: Supervision   See FIM for current functional status  Therapy/Group: Individual Therapy  Karolee Stamps [facility] 09/22/2012, 3:22 PM

## 2012-09-22 NOTE — Progress Notes (Signed)
Occupational Therapy Session Note  Patient Details  Name: Leslie Cox MRN: [id number] Date of Birth: 1946-04-04  Today's Date: 09/22/2012 Time: 1005-1100 Time Calculation (min): 55 min  Short Term Goals: Week 1:  OT Short Term Goal 1 (Week 1): Patient will complete UB/LB bathing with supervision OT Short Term Goal 2 (Week 1): Patient will complete UB dressing with supervision/set-up assist OT Short Term Goal 3 (Week 1): Patient will complete LB dressing with supervision/set-up assist OT Short Term Goal 4 (Week 1): Patient will ambulate into bathroom using rolling walker with  minimal assistance for toilet transfer OT Short Term Goal 5 (Week 1): Patient will perform at least 2 grooming tasks standing at sink with supervision  Skilled Therapeutic Interventions/Progress Updates:  Patient found seated in w/c. Patient engaged in functional ambulation/mobility using rolling walker -> bathroom for toilet transfer then shower stall transfer on/off tub transfer bench. Patient then performed UB/LB bathing at shower level in sit<>stand position using long handled sponge to increase independence. From there patient ambulated -> w/c for UB/LB dressing in sit<>stand position. Notified RN for dressing changes on skin breakdown/wound areas.Focused skilled intervention on sit<>stands, UB/LB bathing & dressing, use of AE to increase independence with LB ADLs, functional mobility using rolling walker, dynamic standing balance/tolerance/endurance, transfers, and overall activity tolerance/endurance. At end of session left patient seated in w/c to maneuver around room prn.   Precautions:  Precautions Precautions: Fall Restrictions Weight Bearing Restrictions: Yes RLE Weight Bearing: Touchdown weight bearing  See FIM for current functional status  Therapy/Group: Individual Therapy  Sheron Tallman 09/22/2012, 11:02 AM

## 2012-09-23 ENCOUNTER — Inpatient Hospital Stay (HOSPITAL_COMMUNITY): Payer: BC Managed Care – PPO

## 2012-09-23 ENCOUNTER — Inpatient Hospital Stay (HOSPITAL_COMMUNITY): Payer: BC Managed Care – PPO | Admitting: Occupational Therapy

## 2012-09-23 NOTE — Progress Notes (Signed)
Subjective/Complaints: Making progress. Pain under reasonable control. A 12 point review of systems has been performed and if not noted above is otherwise negative.   Objective: Vital Signs: Blood pressure 121/75, pulse 83, temperature 97.8 F (36.6 C), temperature source Oral, resp. rate 20, height 5\' 7"  (1.702 m), weight 54.386 kg (119 lb 14.4 oz), SpO2 97.00%. No results found. No results found for this basename: WBC, HGB, HCT, PLT,  in the last 72 hours  Recent Labs  09/21/12 0730  CREATININE 0.66   CBG (last 3)  No results found for this basename: GLUCAP,  in the last 72 hours  Wt Readings from Last 3 Encounters:  09/16/12 54.386 kg (119 lb 14.4 oz)  09/08/12 82.9 kg (182 lb 12.2 oz)  09/08/12 82.9 kg (182 lb 12.2 oz)    Physical Exam:  Blood pressure 164/86, pulse 105, temperature 98.7 F (37.1 C), temperature source Oral, resp. rate 18, height 5\' 7"  (1.702 m), weight 82.9 kg (182 lb 12.2 oz), SpO2 92.00%.  Vitals reviewed.  HENT: oral mucosa pink and moist  Head: Normocephalic.  Eyes: EOM are normal.  Neck: Neck supple. No thyromegaly present.  Cardiovascular: Normal rate and regular rhythm. No murmurs or gallops  Pulmonary/Chest: Effort normal and breath sounds normal. No respiratory distress.  Abdominal: Nontender. Bowel sounds are distant. Abdomen is  Only mildly distended. Neurological:  Patient is alert,  cooperative. Participated with all parts of exam. Patient was alert and oriented x3. He can follow 3 step commands. UE's grossly 4/5. RLE 1-2/5 proximal to 4/5 distal. LLE 3/5 prox to 4/5 distally. No sensory or CN deficits.  Skin:  Surgical sites clean, sutures out. Psych: in good spirits today but with baseline anxiety   Assessment/Plan: 1. Functional deficits secondary tocomplex acetabular fx s/p ORIF  which require 3+ hours per day of interdisciplinary therapy in a comprehensive inpatient rehab setting. Physiatrist is providing close team supervision and  24 hour management of active medical problems listed below. Physiatrist and rehab team continue to assess barriers to discharge/monitor patient progress toward functional and medical goals.  Staples out.   FIM: FIM - Bathing Bathing Steps Patient Completed: Chest;Right Arm;Left Arm;Abdomen;Front perineal area;Buttocks;Right upper leg;Left upper leg;Right lower leg (including foot);Left lower leg (including foot) Bathing: 6: Assistive device (Comment)  FIM - Upper Body Dressing/Undressing Upper body dressing/undressing steps patient completed: Thread/unthread right sleeve of pullover shirt/dresss;Thread/unthread left sleeve of pullover shirt/dress;Put head through opening of pull over shirt/dress;Pull shirt over trunk Upper body dressing/undressing: 5: Set-up assist to: Obtain clothing/put away FIM - Lower Body Dressing/Undressing Lower body dressing/undressing steps patient completed: Thread/unthread right pants leg;Thread/unthread left pants leg;Pull pants up/down;Don/Doff right sock;Don/Doff left shoe;Fasten/unfasten left shoe Lower body dressing/undressing: 5: Set-up assist to: Obtain clothing  FIM - Toileting Toileting steps completed by patient: Adjust clothing prior to toileting;Performs perineal hygiene;Adjust clothing after toileting Toileting Assistive Devices: Grab bar or rail for support Toileting: 6: Assistive device: No helper  FIM - Diplomatic Services operational officer Devices: Elevated toilet seat;Walker;Grab bars Toilet Transfers: 6-Assistive device: No helper  FIM - Banker Devices: Therapist, occupational: 5: Chair or W/C > Bed: Supervision (verbal cues/safety issues);5: Bed > Chair or W/C: Supervision (verbal cues/safety issues)  FIM - Locomotion: Wheelchair Locomotion: Wheelchair: 6: Travels 150 ft or more, turns around, maneuvers to table, bed or toilet, negotiates 3% grade: maneuvers on rugs and over door sills  independently FIM - Locomotion: Ambulation Locomotion: Ambulation Assistive Devices: Designer, industrial/product Ambulation/Gait Assistance:  5: Supervision Locomotion: Ambulation: 5: Travels 150 ft or more with supervision/safety issues  Comprehension Comprehension Mode: Auditory Comprehension: 6-Follows complex conversation/direction: With extra time/assistive device  Expression Expression Mode: Verbal Expression: 6-Expresses complex ideas: With extra time/assistive device  Social Interaction Social Interaction: 6-Interacts appropriately with others with medication or extra time (anti-anxiety, antidepressant).  Problem Solving Problem Solving Mode: Not assessed Problem Solving: 6-Solves complex problems: With extra time  Memory Memory Mode: Not assessed Memory: 6-More than reasonable amt of time  Medical Problem List and Plan:  1. right acetabular fracture. Status post ORIF 09/08/2012  2. DVT Prophylaxis/Anticoagulation: Subcutaneous Lovenox. Dopplers negative 3. Pain Management: Hydrocodone/Robaxin as needed. Monitor with increased activity   -added low dose oxycontin 10mg  q12 which has helped  -encouraged plenty of fluids, candy to help with dry mouth 4. Neuropsych: This patient is capable of making decisions on his/her own behalf.   -needs constant positive reinforcement  -anxiety seems to be improving with better clinical state 5. Postoperative anemia. Continue iron supplement. Followup CBC  6. Postoperative ileus. Advanced diet to regular. Continue Reglan 3 times a day.  .    -softener/laxative daily  -qd to qod bm's 7. Urinary retention. Dc'ed urecholine. Check PVRs x3. Urine study 09/11/2012 negative  LOS (Days) 9 A FACE TO FACE EVALUATION WAS PERFORMED  Leslie Cox T 09/23/2012 8:34 AM

## 2012-09-23 NOTE — Discharge Summary (Signed)
  Discharge summary job [id number]

## 2012-09-23 NOTE — Discharge Summary (Signed)
NAME:  Leslie Cox, Leslie Cox NO.:  [id number]  MEDICAL RECORD NO.:  [id number]  LOCATION:  4035                         FACILITY:  [facility]  PHYSICIAN:  Leslie Cox, P.A.  DATE OF BIRTH:  [birth year]  DATE OF ADMISSION:  09/14/2012 DATE OF DISCHARGE:                              DISCHARGE SUMMARY   DISCHARGE DIAGNOSES:  Right acetabular fracture with open reduction internal fixation.  Subcutaneous Lovenox for deep vein thrombosis prophylaxis, pain management, postoperative anemia.  Postoperative ileus, resolved.  Urinary retention, resolved.  HISTORY OF PRESENT ILLNESS:  This is a 67 year old right-handed male admitted on September 05, 2012, after a mechanical fall.  The patient reports slipping and landing on his right side without loss of consciousness.  X- rays and imaging revealed a right acetabulum fracture, anterior column and posterior hemi transverse.  Orthopedic Services consulted.  Traction pin was applied.  Later underwent ORIF on September 08, 2012 per Dr. Carola Cox. Touchdown weightbearing right lower extremity.  Placed on subcutaneous Lovenox for DVT prophylaxis.  Initially placed on Coumadin for DVT prophylaxis.  Discontinued due to super therapeutic levels, advised to continue only Lovenox.  Postoperative anemia 8.6 and monitored.  Noted bouts of urinary retention and placed on Urecholine and monitored.  Also developed ileus with nasogastric tube placed, which were later removed. His diet was advanced and bowel program re-established.  The patient was admitted for comprehensive rehab program.  PAST MEDICAL HISTORY:  See discharge diagnoses.  SOCIAL HISTORY:  Lives with spouse.  FUNCTIONAL HISTORY:  Prior to admission was independent, working full time.  Functional status upon admission to rehab services was moderate assist for stand pivot transfers, +2 total ambulate 8 feet.  PHYSICAL EXAMINATION:  VITAL SIGNS:  Blood pressure 164/86, pulse 105, temperature  98.7, respirations 18. GENERAL:  This was an alert male, oriented x3.  Pupils reactive to light. LUNGS:  Clear to auscultation. CARDIOVASCULAR:  Cardiac rate controlled. ABDOMEN:  Soft, tender.  Good bowel sounds.  Surgical site healing nicely.  REHABILITATION HOSPITAL COURSE:  The patient was admitted to inpatient rehab services with therapies initiated on a 3-hour daily basis consisting of physical therapy, occupational therapy, and rehabilitation nursing.  The following issues were addressed during the patient's rehabilitation stay.  Pertaining to [name] right acetabular fracture, he had undergone ORIF on September 08, 2012, with follow up with Dr. Carola Cox of Orthopedic Services.  Surgical site healing nicely.  He was touchdown weightbearing.  He remained on subcutaneous Lovenox for DVT prophylaxis at the time of discharge.  Venous Doppler studies negative. Pain control with the use of hydrocodone as well as Robaxin was scheduled, OxyContin and good results.  Postoperative anemia stable. Maintained on iron supplement.  He remained asymptomatic.  His diet was slowly advanced after postoperative ileus to a regular consistency, which he tolerated nicely.  He would be weaned from his Reglan.  He was voiding without difficulty.  His Urecholine had been discontinued.  The patient received weekly collaborative interdisciplinary team conferences to discuss estimated length of stay, family teaching, and any barriers to discharge.  He was overall supervision for basic activities of daily living, steady supervision overall for mobility, full family  teaching was completed.  He was advised no driving.  He will be discharged to home with his wife.  DISCHARGE MEDICATIONS: 1. Hydrocodone 1-2 tablets every 6 hours as needed pain. 2. Niferex tablets 150 mg b.i.d. 3. Lisinopril 2.5 mg p.o. daily. 4. Robaxin 500 mg every 6 hours as needed for spasms. 5. OxyContin sustained release 10 mg every 12  hours x2 weeks and stop. 6. Protonix 40 mg daily. 7. MiraLax 17 g daily with 8 ounces of water. 8. Senokot-S tablets 2 at bedtime.  DIET:  Regular.  SPECIAL INSTRUCTIONS:  Touchdown weightbearing right lower extremity. The patient should follow up Dr. Myrene Cox, with PD service call for appointment.  Leslie Cox at the outpatient rehab service office on October 28, 2012, Dr. Gordy Cox, October 02, 2012.     Leslie Cox, P.A.     DA/MEDQ  D:  09/23/2012  T:  09/23/2012  Job:  [id number]  cc:   Leslie Cox. Leslie Cox, M.D. Leslie Savers, MD

## 2012-09-23 NOTE — Progress Notes (Signed)
Occupational Therapy & Session Notes Discharge Summary  Patient Details  Name: Leslie Cox MRN: [id number] Date of Birth: [birth year]  Today's Date: 09/23/2012  SESSION NOTES  Session #1 4403-4742 - 25 Minutes Individual Therapy Patient with no complaints of pain this am Patient found supine in bed. Patient engaged in bed mobility and donned pants sitting edge of bed. Therapist assisted patient with TEDs, sock, and shoes secondary to time limitation. From room patient ambulated -> therapy gym at modified independent level using rolling walker. In gym patient engaged in therapeutic exercise using SCIFIT machine for 10 minutes without any rest breaks. Patient then ambulated back to room at mod I level using rolling walker. At end of session patient left seated in w/c with call bell, phone, and breakfast tray within reach.   Session #2 1300-1400 - 60 Minutes Individual Therapy No complaints of pain Patient found ambulating around room using rolling walker, safely (patient mod I within room). Patient ambulated from room -> ADL apartment for simulated walk-in shower transfer, then ambulated -> ortho gym for simulated car transfer. Patient then ambulated back to room for education -> wife and sister-in-law on usage of adaptive equipment and UE theraband exercises. Patient's wife and sister-in-law present during entire session for education. Wife and sister-in-law are independent to provide the necessary supervision assistance prn at discharge. Patient left seated in w/c at end of session.   ---------------------------------------------------------------------------------------------------  DISCHARGE SUMMARY Patient has met 11 of 11 long term goals due to improved activity tolerance, improved balance, postural control, ability to compensate for deficits, functional use of  RIGHT lower extremity, improved attention, improved awareness and improved coordination.  Patient to discharge at overall  Modified Independent level.  Patient's care partner is independent to provide the necessary supervision prn assistance at discharge.    Reasons goals not met: n/a, all goals met at this time.   Recommendation: No additional occupational therapy recommended at this time.  Equipment: No equipment provided at this time, patient and wife state they have access to a BSC and shower seat.  Reasons for discharge: treatment goals met and discharge from hospital  Patient/family agrees with progress made and goals achieved: Yes  Precautions/Restrictions  Precautions Precautions: Fall Restrictions Weight Bearing Restrictions: Yes RLE Weight Bearing: Touchdown weight bearing  Vital Signs Therapy Vitals Temp: 97.8 F (36.6 C) Temp src: Oral Pulse Rate: 83 Resp: 20 BP: 121/76 mmHg Oxygen Therapy SpO2: 97 % O2 Device: None (Room air)  ADL - See FIM  Vision/Perception  Vision - History Baseline Vision: Wears glasses all the time Patient Visual Report: No change from baseline Vision - Assessment Eye Alignment: Within Functional Limits Perception Perception: Within Functional Limits Praxis Praxis: Intact   Cognition Overall Cognitive Status: Appears within functional limits for tasks assessed Arousal/Alertness: Awake/alert Orientation Level: Oriented X4 Memory: Appears intact Awareness: Appears intact Problem Solving: Appears intact Reasoning: Appears intact Safety/Judgment: Appears intact  Sensation Sensation Light Touch: Appears Intact (BUEs) Light Touch Impaired Details:  (BUEs) Stereognosis: Appears Intact (BUEs) Hot/Cold: Appears Intact (BUEs) Proprioception: Appears Intact (BUEs) Coordination Gross Motor Movements are Fluid and Coordinated: Yes Fine Motor Movements are Fluid and Coordinated: Yes  Motor - See Discharge Navigator  Mobility  - See Discharge Navigator  Trunk/Postural Assessment  - See Discharge Navigator  Balance - See Discharge  Navigator  Extremity/Trunk Assessment RUE Assessment RUE Assessment: Within Functional Limits LUE Assessment LUE Assessment: Within Functional Limits  See FIM for current functional status  Leslie Cox 09/23/2012, 7:52 AM

## 2012-09-24 MED ORDER — OXYCODONE HCL ER 10 MG PO T12A: 10.0000 mg | EXTENDED_RELEASE_TABLET | Freq: Two times a day (BID) | ORAL | Status: DC

## 2012-09-24 MED ORDER — SENNOSIDES-DOCUSATE SODIUM 8.6-50 MG PO TABS: 2.0000 | ORAL_TABLET | Freq: Every day | ORAL | Status: DC

## 2012-09-24 MED ORDER — METHOCARBAMOL 500 MG PO TABS: 500.0000 mg | ORAL_TABLET | Freq: Four times a day (QID) | ORAL | Status: DC | PRN

## 2012-09-24 MED ORDER — LISINOPRIL 2.5 MG PO TABS: 2.5000 mg | ORAL_TABLET | Freq: Every day | ORAL | Status: DC

## 2012-09-24 MED ORDER — POLYETHYLENE GLYCOL 3350 17 G PO PACK: 17.0000 g | PACK | Freq: Every day | ORAL | Status: DC

## 2012-09-24 MED ORDER — PANTOPRAZOLE SODIUM 40 MG PO TBEC: 40.0000 mg | DELAYED_RELEASE_TABLET | Freq: Every day | ORAL | Status: DC

## 2012-09-24 MED ORDER — POLYSACCHARIDE IRON COMPLEX 150 MG PO CAPS: 150.0000 mg | ORAL_CAPSULE | Freq: Two times a day (BID) | ORAL | Status: DC

## 2012-09-24 MED ORDER — HYDROCODONE-ACETAMINOPHEN 5-325 MG PO TABS: 1.0000 | ORAL_TABLET | Freq: Four times a day (QID) | ORAL | Status: DC | PRN

## 2012-09-24 NOTE — Progress Notes (Signed)
Patient and family received discharge instructions from Deatra Ina, [place].  All questions were answered.  Patient escorted via wheelchair by Truddie Crumble to family's vehicle.  Patient discharged home with family.

## 2012-09-24 NOTE — Progress Notes (Signed)
Social Work  Discharge Note  The overall goal for the admission was met for:   Discharge location: Yes - pt and wife to stay at sister-in-law's home initially (more accessible)  Length of Stay: Yes - 10 days  Discharge activity level: Yes - modified independent  Home/community participation: Yes  Services provided included: MD, RD, PT, OT, RN, TR, Pharmacy and SW  Financial Services: Private Insurance: BCBS of [place]  Follow-up services arranged: Home Health: PT via [facility], DME: NA (had all needed DME) and Patient/Family has no preference for HH/DME agencies  Comments (or additional information):  Patient/Family verbalized understanding of follow-up arrangements: Yes  Individual responsible for coordination of the follow-up plan: patient  Confirmed correct DME delivered: NA  Leslie Cox

## 2012-09-24 NOTE — Progress Notes (Signed)
Subjective/Complaints: Making progress. In good spirits A 12 point review of systems has been performed and if not noted above is otherwise negative.   Objective: Vital Signs: Blood pressure 108/62, pulse 86, temperature 97.9 F (36.6 C), temperature source Oral, resp. rate 20, height 5\' 7"  (1.702 m), weight 69.355 kg (152 lb 14.4 oz), SpO2 96.00%. No results found. No results found for this basename: WBC, HGB, HCT, PLT,  in the last 72 hours No results found for this basename: NA, K, CL, CO, GLUCOSE, BUN, CREATININE, CALCIUM,  in the last 72 hours CBG (last 3)  No results found for this basename: GLUCAP,  in the last 72 hours  Wt Readings from Last 3 Encounters:  09/23/12 69.355 kg (152 lb 14.4 oz)  09/08/12 82.9 kg (182 lb 12.2 oz)  09/08/12 82.9 kg (182 lb 12.2 oz)    Physical Exam:  Blood pressure 164/86, pulse 105, temperature 98.7 F (37.1 C), temperature source Oral, resp. rate 18, height 5\' 7"  (1.702 m), weight 82.9 kg (182 lb 12.2 oz), SpO2 92.00%.  Vitals reviewed.  HENT: oral mucosa pink and moist  Head: Normocephalic.  Eyes: EOM are normal.  Neck: Neck supple. No thyromegaly present.  Cardiovascular: Normal rate and regular rhythm. No murmurs or gallops  Pulmonary/Chest: Effort normal and breath sounds normal. No respiratory distress.  Abdominal: Nontender. Bowel sounds are distant. Abdomen is  Only mildly distended. Neurological:  Patient is alert,  cooperative. Participated with all parts of exam. Patient was alert and oriented x3. He can follow 3 step commands. UE's grossly 4/5. RLE 1-2/5 proximal to 4/5 distal. LLE 3/5 prox to 4/5 distally. No sensory or CN deficits.  Skin:  Surgical sites clean, sutures out. Psych: in good spirits today but with baseline anxiety   Assessment/Plan: 1. Functional deficits secondary tocomplex acetabular fx s/p ORIF  which require 3+ hours per day of interdisciplinary therapy in a comprehensive inpatient rehab  setting. Physiatrist is providing close team supervision and 24 hour management of active medical problems listed below. Physiatrist and rehab team continue to assess barriers to discharge/monitor patient progress toward functional and medical goals.  Home today. Home health therapies.  FIM: FIM - Bathing Bathing Steps Patient Completed: Chest;Right Arm;Left Arm;Abdomen;Front perineal area;Buttocks;Right upper leg;Left upper leg;Right lower leg (including foot);Left lower leg (including foot) Bathing: 6: Assistive device (Comment)  FIM - Upper Body Dressing/Undressing Upper body dressing/undressing steps patient completed: Thread/unthread right sleeve of pullover shirt/dresss;Thread/unthread left sleeve of pullover shirt/dress;Put head through opening of pull over shirt/dress;Pull shirt over trunk Upper body dressing/undressing: 7: Complete Independence: No helper FIM - Lower Body Dressing/Undressing Lower body dressing/undressing steps patient completed: Thread/unthread left pants leg;Pull pants up/down;Thread/unthread right pants leg;Don/Doff right sock;Don/Doff right shoe;Fasten/unfasten left shoe Lower body dressing/undressing: 6: Assistive device (Comment)  FIM - Toileting Toileting steps completed by patient: Adjust clothing prior to toileting;Performs perineal hygiene;Adjust clothing after toileting Toileting Assistive Devices: Grab bar or rail for support Toileting: 6: Assistive device: No helper  FIM - Diplomatic Services operational officer Devices: Elevated toilet seat;Walker;Grab bars Toilet Transfers: 6-Assistive device: No helper  FIM - Banker Devices: Therapist, occupational: 6: Assistive device: no helper  FIM - Locomotion: Wheelchair Locomotion: Wheelchair: 6: Travels 150 ft or more, turns around, maneuvers to table, bed or toilet, negotiates 3% grade: maneuvers on rugs and over door sills independently FIM - Locomotion:  Ambulation Locomotion: Ambulation Assistive Devices: Designer, industrial/product Ambulation/Gait Assistance: 6: Modified independent (Device/Increase time) Locomotion: Ambulation: 6:  Travels 150 ft or more independently/takes more than reasonable amount of time  Comprehension Comprehension Mode: Auditory Comprehension: 6-Follows complex conversation/direction: With extra time/assistive device  Expression Expression Mode: Verbal Expression: 6-Expresses complex ideas: With extra time/assistive device  Social Interaction Social Interaction: 6-Interacts appropriately with others with medication or extra time (anti-anxiety, antidepressant).  Problem Solving Problem Solving Mode: Not assessed Problem Solving: 6-Solves complex problems: With extra time  Memory Memory Mode: Not assessed Memory: 6-More than reasonable amt of time  Medical Problem List and Plan:  1. right acetabular fracture. Status post ORIF 09/08/2012  2. DVT Prophylaxis/Anticoagulation: Subcutaneous Lovenox. Dopplers negative 3. Pain Management: Hydrocodone/Robaxin as needed. Monitor with increased activity   -added low dose oxycontin 10mg  q12 which has helped  -encouraged plenty of fluids, candy to help with dry mouth 4. Neuropsych: This patient is capable of making decisions on his/her own behalf.   -needs constant positive reinforcement  -anxiety seems to be improving with better clinical state 5. Postoperative anemia. Continue iron supplement. Followup CBC  6. Postoperative ileus. Advanced diet to regular. Continue Reglan 3 times a day.  .    -softener/laxative daily  -qd to qod bm's 7. Urinary retention. Dc'ed urecholine. Check PVRs x3. Urine study 09/11/2012 negative  LOS (Days) 10 A FACE TO FACE EVALUATION WAS PERFORMED  Fawna Cranmer T 09/24/2012 7:57 AM

## 2012-10-02 ENCOUNTER — Encounter: Payer: Self-pay | Admitting: Internal Medicine

## 2012-10-02 ENCOUNTER — Ambulatory Visit (INDEPENDENT_AMBULATORY_CARE_PROVIDER_SITE_OTHER): Payer: BC Managed Care – PPO | Admitting: Internal Medicine

## 2012-10-02 VITALS — BP 140/86 | HR 85 | Temp 98.4°F | Resp 18 | Wt 148.0 lb

## 2012-10-02 DIAGNOSIS — S72009D Fracture of unspecified part of neck of unspecified femur, subsequent encounter for closed fracture with routine healing: Secondary | ICD-10-CM

## 2012-10-02 DIAGNOSIS — E559 Vitamin D deficiency, unspecified: Secondary | ICD-10-CM

## 2012-10-02 DIAGNOSIS — I10 Essential (primary) hypertension: Secondary | ICD-10-CM | POA: Insufficient documentation

## 2012-10-02 DIAGNOSIS — S32401D Unspecified fracture of right acetabulum, subsequent encounter for fracture with routine healing: Secondary | ICD-10-CM

## 2012-10-02 NOTE — Progress Notes (Signed)
Subjective:    Patient ID: Leslie Cox, male    DOB: 1945-10-03, 67 y.o.   MRN: [id number]  HPI  67 year old patient who is seen today following a hospital discharge. He was admitted to the orthopedic service after atraumatic right acetabular fracture with protrusio of the femoral head. He is approximately one month postop. Hospital course concha by postop ileus and some urinary retention. At the present time he is ambulatory with a walker and doing quite well. He is receiving active physical therapy and has been followed closely by orthopedic service. No weightbearing at this time He has a history of previously treated hypertension over the past few years has been normal tensive off medication. Lisinopril has been added in the perioperative period due to  persistently elevated readings.  Past Medical History  Diagnosis Date  . Hypertension   . GERD (gastroesophageal reflux disease)     History   Social History  . Marital Status: Married    Spouse Name: N/A    Number of Children: N/A  . Years of Education: N/A   Occupational History  . 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 [employer]   Social History Main Topics  . Smoking status: Never Smoker   . Smokeless tobacco: Never Used  . Alcohol Use: No  . Drug Use: No  . Sexually Active: Not on file   Other Topics Concern  . Not on file   Social History Narrative  . No narrative on file    Past Surgical History  Procedure Laterality Date  . Tonsillectomy    . External fixation leg Right 09/05/2012    Procedure: Application of traction bow externally;  Surgeon: Shelda Pal, MD;  Location: [facility] OR;  Service: Orthopedics;  Laterality: Right;  . Orif acetabular fracture Right 09/08/2012    Procedure: OPEN REDUCTION INTERNAL FIXATION (ORIF) ACETABULAR FRACTURE;  Surgeon: Budd Palmer, MD;  Location: [facility] OR;  Service: Orthopedics;  Laterality: Right;    No family history on file.  No Known Allergies  Current Outpatient  Prescriptions on File Prior to Visit  Medication Sig Dispense Refill  . HYDROcodone-acetaminophen (NORCO/VICODIN) 5-325 MG per tablet Take 1-2 tablets by mouth every 6 (six) hours as needed.  90 tablet  0  . lisinopril (PRINIVIL,ZESTRIL) 2.5 MG tablet Take 1 tablet (2.5 mg total) by mouth daily.  30 tablet  1  . methocarbamol (ROBAXIN) 500 MG tablet Take 1 tablet (500 mg total) by mouth every 6 (six) hours as needed.  60 tablet  0  . pantoprazole (PROTONIX) 40 MG tablet Take 1 tablet (40 mg total) by mouth daily.  30 tablet  0  . polyethylene glycol (MIRALAX / GLYCOLAX) packet Take 17 g by mouth daily.  14 each    . senna-docusate (SENOKOT-S) 8.6-50 MG per tablet Take 2 tablets by mouth at bedtime.       No current facility-administered medications on file prior to visit.    BP 140/86  Pulse 85  Temp(Src) 98.4 F (36.9 C) (Oral)  Resp 18  Wt 148 lb (67.132 kg)  BMI 23.17 kg/m2  SpO2 97%     Review of Systems  Constitutional: Negative for fever, chills, appetite change and fatigue.  HENT: Negative for hearing loss, ear pain, congestion, sore throat, trouble swallowing, neck stiffness, dental problem, voice change and tinnitus.   Eyes: Negative for pain, discharge and visual disturbance.  Respiratory: Negative for cough, chest tightness, wheezing and stridor.   Cardiovascular: Negative for chest pain,  palpitations and leg swelling.  Gastrointestinal: Negative for nausea, vomiting, abdominal pain, diarrhea, constipation, blood in stool and abdominal distention.  Genitourinary: Negative for urgency, hematuria, flank pain, discharge, difficulty urinating and genital sores.  Musculoskeletal: Positive for arthralgias and gait problem. Negative for myalgias, back pain and joint swelling.  Skin: Negative for rash.  Neurological: Negative for dizziness, syncope, speech difficulty, weakness, numbness and headaches.  Hematological: Negative for adenopathy. Does not bruise/bleed easily.   Psychiatric/Behavioral: Negative for behavioral problems and dysphoric mood. The patient is not nervous/anxious.        Objective:   Physical Exam  Constitutional: He is oriented to person, place, and time. He appears well-developed.  Blood pressure as low as 120/72  HENT:  Head: Normocephalic.  Right Ear: External ear normal.  Left Ear: External ear normal.  Eyes: Conjunctivae and EOM are normal.  Neck: Normal range of motion.  Cardiovascular: Normal rate and normal heart sounds.   Pulmonary/Chest: Breath sounds normal.  Abdominal: Bowel sounds are normal.  Suprapubic incision nicely healed  Musculoskeletal: Normal range of motion. He exhibits no edema and no tenderness.  Neurological: He is alert and oriented to person, place, and time.  Psychiatric: He has a normal mood and affect. His behavior is normal.          Assessment & Plan:   Hypertension. Blood pressure well controlled will continue low-dose lisinopril and consider up discontinuation in the future. Return in the fall for his annual physical Status post right acetabular fracture. Followup orthopedics History postop ileus History of postop urinary retention

## 2012-10-02 NOTE — Patient Instructions (Signed)
Orthopedic followup Physical therapy followup  Return in the fall for your annual exam  Take a calcium supplement, plus [id number] units of vitamin D

## 2012-10-21 ENCOUNTER — Ambulatory Visit: Payer: BC Managed Care – PPO | Attending: Orthopedic Surgery | Admitting: Physical Therapy

## 2012-10-21 DIAGNOSIS — R5381 Other malaise: Secondary | ICD-10-CM | POA: Insufficient documentation

## 2012-10-21 DIAGNOSIS — M25669 Stiffness of unspecified knee, not elsewhere classified: Secondary | ICD-10-CM | POA: Insufficient documentation

## 2012-10-21 DIAGNOSIS — IMO0001 Reserved for inherently not codable concepts without codable children: Secondary | ICD-10-CM | POA: Insufficient documentation

## 2012-10-21 DIAGNOSIS — M25559 Pain in unspecified hip: Secondary | ICD-10-CM | POA: Insufficient documentation

## 2012-10-22 ENCOUNTER — Telehealth: Payer: Self-pay | Admitting: Internal Medicine

## 2012-10-22 ENCOUNTER — Ambulatory Visit: Payer: BC Managed Care – PPO | Admitting: Physical Therapy

## 2012-10-22 NOTE — Telephone Encounter (Signed)
Patient Information:  Caller Name: Britta Mccreedy  Phone: [phone]  Patient: Leslie Cox, [place]  Gender: Male  DOB: 10/09/1945  Age: 67 Years  PCP: Eleonore Chiquito [facility])  Office Follow Up:  Does the office need to follow up with this patient?: Yes  Instructions For The Office: Caller asks to change BP medication without patient being seen; states he will have to get a ride to office and unable to do so before 10/26/12, during which time he would have to get Lisinopril refilled.   Symptoms  Reason For Call & Symptoms: Britta Mccreedy / sister in law  calling about nagging, hacking cough; onset after he began Lisinopril 2.5 mg.  Emergent symptoms ruled out.  See Within 3 Days in Office per Cough protocol due to Taking an ACE Inhibitor medication.  Home care for the interim and parameters for callback given.  Reviewed Health History In EMR: Yes  Reviewed Medications In EMR: Yes  Reviewed Allergies In EMR: Yes  Reviewed Surgeries / Procedures: Yes  Date of Onset of Symptoms: 10/08/2012  Guideline(s) Used:  Cough  Disposition Per Guideline:   See Within 3 Days in Office  Reason For Disposition Reached:   Taking an ACE Inhibitor medication (e.g., benazepril/LOTENSIN, captopril/CAPOTEN, enalapril/VASOTEC, lisinopril/ZESTRIL)  Advice Given:  Coughing Spasms:  Drink warm fluids. Inhale warm mist (Reason: both relax the airway and loosen up the phlegm).  Suck on cough drops or hard candy to coat the irritated throat.  Call Back If:  Difficulty breathing  You become worse.  Patient Refused Recommendation:  Patient Refused Care Advice  Caller asks to change BP medication without patient being seen; states he will have to get a ride to office and unable to do so before 10/26/12, during which time he would have to get Lisinopril refilled.

## 2012-10-23 NOTE — Telephone Encounter (Signed)
Pt has enough lisinopril to last until Monday. Please call pt today

## 2012-10-26 ENCOUNTER — Ambulatory Visit (INDEPENDENT_AMBULATORY_CARE_PROVIDER_SITE_OTHER): Payer: BC Managed Care – PPO | Admitting: Internal Medicine

## 2012-10-26 ENCOUNTER — Encounter: Payer: Self-pay | Admitting: Internal Medicine

## 2012-10-26 VITALS — BP 130/80 | HR 81 | Temp 97.9°F | Resp 20 | Wt 147.0 lb

## 2012-10-26 DIAGNOSIS — I10 Essential (primary) hypertension: Secondary | ICD-10-CM

## 2012-10-26 DIAGNOSIS — S32401D Unspecified fracture of right acetabulum, subsequent encounter for fracture with routine healing: Secondary | ICD-10-CM

## 2012-10-26 DIAGNOSIS — S72009D Fracture of unspecified part of neck of unspecified femur, subsequent encounter for closed fracture with routine healing: Secondary | ICD-10-CM

## 2012-10-26 NOTE — Telephone Encounter (Signed)
Ask patient to discontinue lisinopril and observe blood pressure without medication at this time. Notify the office if blood pressures continually greater than 140/90

## 2012-10-26 NOTE — Telephone Encounter (Signed)
Pt here for visit gave him message that Dr. Amador Cunas wants him to discontinue Lisinopril and monitor blood pressure if it continually stays at 140/90 need to notify office. Pt verbalized understanding but stated does not have a blood pressure cuff at home. Will discuss with Dr. Amador Cunas.

## 2012-10-26 NOTE — Progress Notes (Signed)
Subjective:    Patient ID: Leslie Cox, male    DOB: Aug 05, 1945, 67 y.o.   MRN: [id number]  HPI  67 year old patient who is seen today to reassess high blood pressure. He is approximately 6 weeks postop from a acetabular fracture and was placed on low-dose lisinopril in the perioperative period. More recently has developed a fairly minor cough. Blood pressure 2 weeks ago was normal blood pressure today has ranged from 110/70-130/80. Denies any  allergy symptoms such as postnasal drip  Past Medical History  Diagnosis Date  . Hypertension   . GERD (gastroesophageal reflux disease)     History   Social History  . Marital Status: Married    Spouse Name: N/A    Number of Children: N/A  . Years of Education: N/A   Occupational History  . 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 [employer]   Social History Main Topics  . Smoking status: Never Smoker   . Smokeless tobacco: Never Used  . Alcohol Use: No  . Drug Use: No  . Sexually Active: Not on file   Other Topics Concern  . Not on file   Social History Narrative  . No narrative on file    Past Surgical History  Procedure Laterality Date  . Tonsillectomy    . External fixation leg Right 09/05/2012    Procedure: Application of traction bow externally;  Surgeon: Shelda Pal, MD;  Location: [facility] OR;  Service: Orthopedics;  Laterality: Right;  . Orif acetabular fracture Right 09/08/2012    Procedure: OPEN REDUCTION INTERNAL FIXATION (ORIF) ACETABULAR FRACTURE;  Surgeon: Budd Palmer, MD;  Location: [facility] OR;  Service: Orthopedics;  Laterality: Right;    No family history on file.  No Known Allergies  Current Outpatient Prescriptions on File Prior to Visit  Medication Sig Dispense Refill  . HYDROcodone-acetaminophen (NORCO/VICODIN) 5-325 MG per tablet Take 1-2 tablets by mouth every 6 (six) hours as needed.  90 tablet  0  . methocarbamol (ROBAXIN) 500 MG tablet Take 1 tablet (500 mg total) by mouth every 6 (six) hours as needed.  60  tablet  0  . Multiple Vitamin (MULTIVITAMIN) tablet Take 1 tablet by mouth daily.      . pantoprazole (PROTONIX) 40 MG tablet Take 1 tablet (40 mg total) by mouth daily.  30 tablet  0  . polyethylene glycol (MIRALAX / GLYCOLAX) packet Take 17 g by mouth daily.  14 each    . senna-docusate (SENOKOT-S) 8.6-50 MG per tablet Take 2 tablets by mouth at bedtime.       No current facility-administered medications on file prior to visit.    BP 130/80  Pulse 81  Temp(Src) 97.9 F (36.6 C) (Oral)  Resp 20  Wt 147 lb (66.679 kg)  BMI 23.02 kg/m2  SpO2 97%     Review of Systems  Constitutional: Negative for fever, chills, appetite change and fatigue.  HENT: Negative for hearing loss, ear pain, congestion, sore throat, trouble swallowing, neck stiffness, dental problem, voice change and tinnitus.   Eyes: Negative for pain, discharge and visual disturbance.  Respiratory: Positive for cough. Negative for chest tightness, wheezing and stridor.   Cardiovascular: Negative for chest pain, palpitations and leg swelling.  Gastrointestinal: Negative for nausea, vomiting, abdominal pain, diarrhea, constipation, blood in stool and abdominal distention.  Genitourinary: Negative for urgency, hematuria, flank pain, discharge, difficulty urinating and genital sores.  Musculoskeletal: Negative for myalgias, back pain, joint swelling, arthralgias and gait problem.  Skin: Negative for  rash.  Neurological: Negative for dizziness, syncope, speech difficulty, weakness, numbness and headaches.  Hematological: Negative for adenopathy. Does not bruise/bleed easily.  Psychiatric/Behavioral: Negative for behavioral problems and dysphoric mood. The patient is not nervous/anxious.        Objective:   Physical Exam  Constitutional: He is oriented to person, place, and time. He appears well-developed.  Blood pressure 124/74  HENT:  Head: Normocephalic.  Right Ear: External ear normal.  Left Ear: External ear  normal.  Eyes: Conjunctivae and EOM are normal.  Neck: Normal range of motion.  Cardiovascular: Normal rate, regular rhythm and normal heart sounds.   Pulmonary/Chest: Breath sounds normal.  Abdominal: Bowel sounds are normal.  Musculoskeletal: Normal range of motion. He exhibits no edema and no tenderness.  Neurological: He is alert and oriented to person, place, and time.  Psychiatric: He has a normal mood and affect. His behavior is normal.          Assessment & Plan:   Cough. Possibly related to ACE inhibition. History mild hypertension. Blood pressure is well-controlled on a very low dose of lisinopril. Options discussed we'll discontinue lisinopril and observe blood pressure off medication. Home blood pressure monitoring encouraged. He is scheduled for followup in the fall. We'll reassess at that time. He'll call blood pressures consistently greater than 140/90

## 2012-10-26 NOTE — Patient Instructions (Addendum)
Discontinue lisinopril  Limit your sodium (Salt) intake  Please check your blood pressure on a regular basis.  If it is consistently greater than 150/90, please make an office appointment.

## 2012-10-26 NOTE — Telephone Encounter (Signed)
Left message on voicemail to call office.  

## 2012-10-27 ENCOUNTER — Telehealth: Payer: Self-pay | Admitting: Physical Medicine & Rehabilitation

## 2012-10-27 ENCOUNTER — Ambulatory Visit: Payer: BC Managed Care – PPO | Admitting: Physical Therapy

## 2012-10-27 NOTE — Telephone Encounter (Signed)
Leslie Cox, called in wanting to first reschedule Leslie Cox's appointment, then decided why he needs to come in.[name] She was advise that its a follow up.. She states she is his ride and wife... pls call her at [phone].[name] She was advise it may be until the end of the day or first thing in the morning.[name]

## 2012-10-28 ENCOUNTER — Encounter: Payer: BC Managed Care – PPO | Admitting: Physical Medicine & Rehabilitation

## 2012-10-28 NOTE — Telephone Encounter (Signed)
Left message for Leslie Cox to return call.

## 2012-10-28 NOTE — Telephone Encounter (Signed)
Explained to Britta Mccreedy that the appointment is to check on status of patient.  She understands and will make an appointment.

## 2012-10-29 ENCOUNTER — Ambulatory Visit: Payer: BC Managed Care – PPO | Attending: Orthopedic Surgery | Admitting: Physical Therapy

## 2012-10-29 DIAGNOSIS — R5381 Other malaise: Secondary | ICD-10-CM | POA: Insufficient documentation

## 2012-10-29 DIAGNOSIS — M25559 Pain in unspecified hip: Secondary | ICD-10-CM | POA: Insufficient documentation

## 2012-10-29 DIAGNOSIS — IMO0001 Reserved for inherently not codable concepts without codable children: Secondary | ICD-10-CM | POA: Insufficient documentation

## 2012-10-29 DIAGNOSIS — M25669 Stiffness of unspecified knee, not elsewhere classified: Secondary | ICD-10-CM | POA: Insufficient documentation

## 2012-11-03 ENCOUNTER — Ambulatory Visit: Payer: BC Managed Care – PPO | Admitting: Physical Therapy

## 2012-11-05 ENCOUNTER — Ambulatory Visit: Payer: BC Managed Care – PPO | Admitting: Physical Therapy

## 2012-11-09 ENCOUNTER — Encounter: Payer: Self-pay | Admitting: Physical Medicine & Rehabilitation

## 2012-11-09 ENCOUNTER — Encounter
Payer: BC Managed Care – PPO | Attending: Physical Medicine & Rehabilitation | Admitting: Physical Medicine & Rehabilitation

## 2012-11-09 VITALS — BP 159/94 | HR 84 | Resp 16 | Ht 67.0 in | Wt 148.0 lb

## 2012-11-09 DIAGNOSIS — S32401D Unspecified fracture of right acetabulum, subsequent encounter for fracture with routine healing: Secondary | ICD-10-CM

## 2012-11-09 DIAGNOSIS — K219 Gastro-esophageal reflux disease without esophagitis: Secondary | ICD-10-CM | POA: Insufficient documentation

## 2012-11-09 DIAGNOSIS — Z4789 Encounter for other orthopedic aftercare: Secondary | ICD-10-CM | POA: Insufficient documentation

## 2012-11-09 DIAGNOSIS — M25559 Pain in unspecified hip: Secondary | ICD-10-CM | POA: Insufficient documentation

## 2012-11-09 DIAGNOSIS — I10 Essential (primary) hypertension: Secondary | ICD-10-CM | POA: Insufficient documentation

## 2012-11-09 DIAGNOSIS — S72009D Fracture of unspecified part of neck of unspecified femur, subsequent encounter for closed fracture with routine healing: Secondary | ICD-10-CM

## 2012-11-09 NOTE — Patient Instructions (Signed)
PT:  PLEASE WORK ON RIGHT HIP FLEXOR STRETCHING IN ADDITION TO STRENGTHENING OF HIS HIP GIRDLE MUSCULATURE.  ADVANCE TO STRAIGHT CANE.  AT SOME POINT HE WILL NEED TO WORK ON WORK CONDITIONING AS HE WANTS TO RETURN TO FULL TIME WORK.

## 2012-11-09 NOTE — Progress Notes (Signed)
Subjective:    Patient ID: Leslie Cox, male    DOB: May 18, 1946, 67 y.o.   MRN: [id number]  HPI  Leslie Cox is back regarding his right acetabular fx. He has been cleared to put full weight on the right foot. His pain has subsided a great deal. He only takes pain medication at night, and that medication usually is only tylenol at this point. He is walking with a walker currently. He is working with PT at [facility]. He saw Dr. Carola Frost a week or so ago. 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   Family reports that Leslie Cox is partcipating in his exercises religiously at home.   Pain Inventory Average Pain 0 Pain Right Now 0 My pain is tingling  In the last 24 hours, has pain interfered with the following? General activity 0 Relation with others 0 Enjoyment of life 0 What TIME of day is your pain at its worst? night Sleep (in general) Good  Pain is worse with: inactivity Pain improves with: therapy/exercise and pacing activities Relief from Meds: 8  Mobility use a walker how many minutes can you walk? 10 ability to climb steps?  no do you drive?  yes transfers alone Do you have any goals in this area?  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  no  Neuro/Psych tingling  Prior Studies Any changes since last visit?  no  Physicians involved in your care Any changes since last visit?  no   History reviewed. No pertinent family history. History   Social History  . Marital Status: Married    Spouse Name: N/A    Number of Children: N/A  . Years of Education: N/A   Occupational History  . 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 [employer]   Social History Main Topics  . Smoking status: Never Smoker   . Smokeless tobacco: Never Used  . Alcohol Use: No  . Drug Use: No  . Sexually Active: None   Other Topics Concern  . None   Social History Narrative  . None   Past  Surgical History  Procedure Laterality Date  . Tonsillectomy    . External fixation leg Right 09/05/2012    Procedure: Application of traction bow externally;  Surgeon: Shelda Pal, MD;  Location: [facility] OR;  Service: Orthopedics;  Laterality: Right;  . Orif acetabular fracture Right 09/08/2012    Procedure: OPEN REDUCTION INTERNAL FIXATION (ORIF) ACETABULAR FRACTURE;  Surgeon: Budd Palmer, MD;  Location: [facility] OR;  Service: Orthopedics;  Laterality: Right;   Past Medical History  Diagnosis Date  . Hypertension   . GERD (gastroesophageal reflux disease)    BP 159/94  Pulse 84  Resp 16  Ht 5\' 7"  (1.702 m)  Wt 148 lb (67.132 kg)  BMI 23.17 kg/m2  SpO2 99%     Review of Systems  Neurological:       Tingling  All other systems reviewed and are negative.       Objective:   Physical Exam   General: Alert and oriented x 3, No apparent distress HEENT: Head is normocephalic, atraumatic, PERRLA, EOMI, sclera anicteric, oral mucosa pink and moist, dentition intact, ext ear canals clear,  Neck: Supple without JVD or lymphadenopathy Heart: Reg rate and rhythm. No murmurs rubs or gallops Chest: CTA bilaterally without wheezes, rales, or rhonchi; no distress Abdomen:  Soft, non-tender, non-distended, bowel sounds positive. Extremities: No clubbing, cyanosis, or edema. Pulses are 2+ Skin: Clean and intact without signs of breakdown Neuro: Pt is cognitively appropriate with normal insight, memory, and awareness. Cranial nerves 2-12 are intact. Sensory exam is normal. Reflexes are 2+ in all 4's. Fine motor coordination is intact. No tremors. Motor function is grossly 5/5 except for weakness with right HF, HAB, HAD, HE. He tends to walk gingerly on the right side, hesitant to flex his right hip.   Musculoskeletal: He has mild discomfort still with hip girdle exercises. Standing posture is good. He transfers easily from a sit to stand position.  Psych: Pt's affect is appropriate. Pt is  cooperative.         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    Plan:  Leslie Cox has progressed nicely. I made recommendations for therapy to work on hip girdle strengthening and stretching. He is cleared from ortho to pursue activity as tolerated. He wants to get back to work which requires constant standing and light lifting. At this point, I believe he is 2 months away from being able to do so.( he is still walking with a walker).  He might be able to accelerate a return to work if he is aggressive with a strength and stamina program. Therapy could begin some work conditioning for him as well.   I would be happy to assist his transition back to work as needed.   Overall, his pain is substantially improved, and he has done quite well.   30 minutes of face to face patient care time were spent during this visit. All questions were encouraged and answered. I will see him back prn.

## 2012-11-10 ENCOUNTER — Ambulatory Visit: Payer: BC Managed Care – PPO | Admitting: Physical Therapy

## 2012-11-12 ENCOUNTER — Ambulatory Visit: Payer: BC Managed Care – PPO | Admitting: Physical Therapy

## 2012-11-17 ENCOUNTER — Ambulatory Visit: Payer: BC Managed Care – PPO | Admitting: Physical Therapy

## 2012-11-19 ENCOUNTER — Ambulatory Visit: Payer: BC Managed Care – PPO | Admitting: Physical Therapy

## 2012-11-24 ENCOUNTER — Ambulatory Visit: Payer: BC Managed Care – PPO | Admitting: Physical Therapy

## 2012-11-26 ENCOUNTER — Ambulatory Visit: Payer: BC Managed Care – PPO | Admitting: Physical Therapy

## 2012-12-01 ENCOUNTER — Ambulatory Visit: Payer: BC Managed Care – PPO | Attending: Orthopedic Surgery | Admitting: Physical Therapy

## 2012-12-01 DIAGNOSIS — M25559 Pain in unspecified hip: Secondary | ICD-10-CM | POA: Insufficient documentation

## 2012-12-01 DIAGNOSIS — IMO0001 Reserved for inherently not codable concepts without codable children: Secondary | ICD-10-CM | POA: Insufficient documentation

## 2012-12-01 DIAGNOSIS — R5381 Other malaise: Secondary | ICD-10-CM | POA: Insufficient documentation

## 2012-12-01 DIAGNOSIS — M25669 Stiffness of unspecified knee, not elsewhere classified: Secondary | ICD-10-CM | POA: Insufficient documentation

## 2012-12-03 ENCOUNTER — Ambulatory Visit: Payer: BC Managed Care – PPO | Admitting: Physical Therapy

## 2012-12-08 ENCOUNTER — Ambulatory Visit: Payer: BC Managed Care – PPO | Admitting: Physical Therapy

## 2012-12-10 ENCOUNTER — Ambulatory Visit: Payer: BC Managed Care – PPO | Admitting: Physical Therapy

## 2013-01-11 ENCOUNTER — Other Ambulatory Visit (INDEPENDENT_AMBULATORY_CARE_PROVIDER_SITE_OTHER): Payer: BC Managed Care – PPO

## 2013-01-11 DIAGNOSIS — Z0000 Encounter for general adult medical examination without abnormal findings: Secondary | ICD-10-CM

## 2013-01-11 LAB — CBC WITH DIFFERENTIAL/PLATELET
Basophils Absolute: 0 10*3/uL (ref 0.0–0.1)
Basophils Relative: 0.3 % (ref 0.0–3.0)
Eosinophils Absolute: 0.1 10*3/uL (ref 0.0–0.7)
Eosinophils Relative: 2.3 % (ref 0.0–5.0)
HCT: 44.3 % (ref 39.0–52.0)
Hemoglobin: 14.9 g/dL (ref 13.0–17.0)
Lymphocytes Relative: 23.5 % (ref 12.0–46.0)
Lymphs Abs: 1.3 10*3/uL (ref 0.7–4.0)
MCHC: 33.7 g/dL (ref 30.0–36.0)
MCV: 88.5 fl (ref 78.0–100.0)
Monocytes Absolute: 0.5 10*3/uL (ref 0.1–1.0)
Monocytes Relative: 8.7 % (ref 3.0–12.0)
Neutro Abs: 3.6 10*3/uL (ref 1.4–7.7)
Neutrophils Relative %: 65.2 % (ref 43.0–77.0)
Platelets: 230 10*3/uL (ref 150.0–400.0)
RBC: 5 Mil/uL (ref 4.22–5.81)
RDW: 14.5 % (ref 11.5–14.6)
WBC: 5.5 10*3/uL (ref 4.5–10.5)

## 2013-01-11 LAB — HEPATIC FUNCTION PANEL
Alkaline Phosphatase: 61 U/L (ref 39–117)
Bilirubin, Direct: 0.1 mg/dL (ref 0.0–0.3)
Total Protein: 6.4 g/dL (ref 6.0–8.3)

## 2013-01-11 LAB — POCT URINALYSIS DIPSTICK
Bilirubin, UA: NEGATIVE
Ketones, UA: NEGATIVE
Leukocytes, UA: NEGATIVE
Nitrite, UA: NEGATIVE
Protein, UA: NEGATIVE
pH, UA: 7

## 2013-01-11 LAB — BASIC METABOLIC PANEL WITH GFR
BUN: 12 mg/dL (ref 6–23)
CO2: 29 meq/L (ref 19–32)
Calcium: 9.2 mg/dL (ref 8.4–10.5)
Chloride: 105 meq/L (ref 96–112)
Creatinine, Ser: 0.6 mg/dL (ref 0.4–1.5)
GFR: 143.91 mL/min
Glucose, Bld: 89 mg/dL (ref 70–99)
Potassium: 4.6 meq/L (ref 3.5–5.1)
Sodium: 140 meq/L (ref 135–145)

## 2013-01-11 LAB — LIPID PANEL
Cholesterol: 181 mg/dL (ref 0–200)
HDL: 33.7 mg/dL — ABNORMAL LOW (ref >39.00)

## 2013-01-11 LAB — PSA: PSA: 2.18 ng/mL (ref 0.10–4.00)

## 2013-01-12 ENCOUNTER — Other Ambulatory Visit: Payer: BC Managed Care – PPO | Admitting: Internal Medicine

## 2013-01-18 ENCOUNTER — Ambulatory Visit (INDEPENDENT_AMBULATORY_CARE_PROVIDER_SITE_OTHER): Payer: BC Managed Care – PPO | Admitting: Internal Medicine

## 2013-01-18 ENCOUNTER — Encounter: Payer: Self-pay | Admitting: Internal Medicine

## 2013-01-18 VITALS — BP 140/90 | HR 77 | Temp 98.1°F | Resp 20 | Ht 66.0 in | Wt 152.0 lb

## 2013-01-18 DIAGNOSIS — Z0000 Encounter for general adult medical examination without abnormal findings: Secondary | ICD-10-CM

## 2013-01-18 MED ORDER — LISINOPRIL 20 MG PO TABS: 20.0000 mg | ORAL_TABLET | Freq: Every day | ORAL | Status: DC

## 2013-01-18 NOTE — Patient Instructions (Signed)
Limit your sodium (Salt) intake  Please check your blood pressure on a regular basis.  If it is consistently greater than 150/90, please make an office appointment.     It is important that you exercise regularly, at least 20 minutes 3 to 4 times per week.  If you develop chest pain or shortness of breath seek  medical attention. 

## 2013-01-18 NOTE — Progress Notes (Signed)
Patient ID: Leslie Cox, male   DOB: [birth year], 67 y.o.   MRN: [id number]   Subjective:    Patient ID: Leslie Cox, male    DOB: 18-Nov-1945, 67 y.o.   MRN: [id number]  HPI Subjective:    Patient ID: Leslie Cox, male    DOB: February 17, 1946, 67 y.o.   MRN: [id number]  HPI  67 -year-old patient who is seen today for a wellness exam. He enjoys excellent health. There has been a history of hypertension in the past but he has been successfully tapered off medication. He does monitor blood pressure with normal results. His only complaint today or concern is excessive flatus. He did have a screening colonoscopy by Dr. Laural Benes in 2005    Status post right acetabular fracture March 2014   Preventive Screening-Counseling & Management  Alcohol-Tobacco  Smoking Status: never   Allergies (verified):  No Known Drug Allergies   Past History:  Past Medical History:  history of hypertension   Past Surgical History:  Tonsillectomy 1955  Colonoscopy 2005  Family History:  Reviewed history and no changes required.  father died in his 75s, probable coronary artery disease  mother is 64, resident of friends home  4 sisters are in good health   Social History:  Reviewed history and no changes required.  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  HENT: Negative for hearing loss, ear pain, congestion, rhinorrhea, sneezing, mouth sores, trouble swallowing, neck pain, neck stiffness, dental problem, voice change, sinus pressure and tinnitus.   Eyes: Negative for photophobia, pain, redness and visual disturbance.  Respiratory: Negative for apnea, cough, choking, chest tightness, shortness of breath and wheezing.   Cardiovascular: Negative for chest pain, palpitations and leg swelling.  Gastrointestinal: Negative for nausea, vomiting, abdominal pain, diarrhea,  constipation, blood in stool, abdominal distention, anal bleeding and rectal pain.  Genitourinary: Negative for dysuria, urgency, frequency, hematuria, flank pain, decreased urine volume, discharge, penile swelling, scrotal swelling, difficulty urinating, genital sores and testicular pain.  Musculoskeletal: Negative for myalgias, back pain, joint swelling, arthralgias and gait problem.  Skin: Negative for color change, rash and wound.  Neurological: Negative for dizziness, tremors, seizures, syncope, facial asymmetry, speech difficulty, weakness, light-headedness, numbness and headaches.  Hematological: Negative for adenopathy. Does not bruise/bleed easily.  Psychiatric/Behavioral: Negative for suicidal ideas, hallucinations, behavioral problems, confusion, sleep disturbance, self-injury, dysphoric mood, decreased concentration and agitation. The patient is not nervous/anxious.        Objective:   Physical Exam  Constitutional: He appears well-developed and well-nourished.  HENT:  Head: Normocephalic and atraumatic.  Right Ear: External ear normal.  Left Ear: External ear normal.  Nose: Nose normal.  Mouth/Throat: Oropharynx is clear and moist.  Eyes: Conjunctivae and EOM are normal. Pupils are equal, round, and reactive to light. No scleral icterus.  Neck: Normal range of motion. Neck supple. No JVD present. No thyromegaly present.  Cardiovascular: Regular rhythm, normal heart sounds and intact distal pulses.  Exam reveals no gallop and no friction rub.   No murmur heard. Pulmonary/Chest: Effort normal and breath sounds normal. He exhibits no tenderness.  Abdominal: Soft. Bowel sounds are normal. He exhibits no distension and no mass. There is no tenderness.  Genitourinary: Prostate normal and penis normal.  Musculoskeletal: Normal range of motion. He exhibits no edema and no tenderness.  Lymphadenopathy:    He has no cervical adenopathy.  Neurological: He  is alert. He has normal  reflexes. No cranial nerve deficit. Coordination normal.  Skin: Skin is warm and dry. No rash noted.  Psychiatric: He has a normal mood and affect. His behavior is normal.          Assessment & Plan:   Preventive health examination Normotensive  Exercise regimen low salt diet all encouraged. He will return in one year for followup. He's been asked to monitor his blood pressure readings    Review of Systems    see above Objective:   Physical Exam BP  140/90  See above      Assessment & Plan:  Preventive health exam  borderline hypertension.  Low-salt diet recommended. Salt restriction encouraged. Home blood pressure monitoring encouraged. He states that blood pressures are generally in the 160/90 range when he checks blood pressures at local drug stores. He also states that blood pressure was elevated during his [facility] requiring therapy. He does have a prior history of treated hypertension

## 2013-01-18 NOTE — Progress Notes (Deleted)
  Subjective:    Patient ID: Leslie Cox, male    DOB: Jan 05, 1946, 67 y.o.   MRN: [id number]  HPI    Review of Systems     Objective:   Physical Exam        Assessment & Plan:

## 2013-01-27 ENCOUNTER — Ambulatory Visit: Payer: BC Managed Care – PPO | Attending: Orthopedic Surgery

## 2013-01-27 DIAGNOSIS — M25559 Pain in unspecified hip: Secondary | ICD-10-CM | POA: Insufficient documentation

## 2013-01-27 DIAGNOSIS — R269 Unspecified abnormalities of gait and mobility: Secondary | ICD-10-CM | POA: Insufficient documentation

## 2013-01-27 DIAGNOSIS — R262 Difficulty in walking, not elsewhere classified: Secondary | ICD-10-CM | POA: Insufficient documentation

## 2013-01-27 DIAGNOSIS — IMO0001 Reserved for inherently not codable concepts without codable children: Secondary | ICD-10-CM | POA: Insufficient documentation

## 2013-01-27 DIAGNOSIS — M6281 Muscle weakness (generalized): Secondary | ICD-10-CM | POA: Insufficient documentation

## 2013-02-02 ENCOUNTER — Ambulatory Visit: Payer: BC Managed Care – PPO | Attending: Orthopedic Surgery | Admitting: Physical Therapy

## 2013-02-02 DIAGNOSIS — R269 Unspecified abnormalities of gait and mobility: Secondary | ICD-10-CM | POA: Insufficient documentation

## 2013-02-02 DIAGNOSIS — M25559 Pain in unspecified hip: Secondary | ICD-10-CM | POA: Insufficient documentation

## 2013-02-02 DIAGNOSIS — IMO0001 Reserved for inherently not codable concepts without codable children: Secondary | ICD-10-CM | POA: Insufficient documentation

## 2013-02-02 DIAGNOSIS — M6281 Muscle weakness (generalized): Secondary | ICD-10-CM | POA: Insufficient documentation

## 2013-02-02 DIAGNOSIS — R262 Difficulty in walking, not elsewhere classified: Secondary | ICD-10-CM | POA: Insufficient documentation

## 2013-02-04 ENCOUNTER — Ambulatory Visit: Payer: BC Managed Care – PPO

## 2013-02-08 ENCOUNTER — Ambulatory Visit: Payer: BC Managed Care – PPO | Admitting: Physical Therapy

## 2013-02-11 ENCOUNTER — Ambulatory Visit: Payer: BC Managed Care – PPO | Admitting: Physical Therapy

## 2013-02-16 ENCOUNTER — Ambulatory Visit: Payer: BC Managed Care – PPO | Admitting: Physical Therapy

## 2013-02-18 ENCOUNTER — Ambulatory Visit: Payer: BC Managed Care – PPO | Admitting: Physical Therapy

## 2013-02-23 ENCOUNTER — Ambulatory Visit: Payer: BC Managed Care – PPO | Admitting: Physical Therapy

## 2013-02-25 ENCOUNTER — Ambulatory Visit: Payer: BC Managed Care – PPO | Admitting: Physical Therapy

## 2013-03-02 ENCOUNTER — Encounter: Payer: BC Managed Care – PPO | Admitting: Physical Therapy

## 2013-03-04 ENCOUNTER — Encounter: Payer: BC Managed Care – PPO | Admitting: Physical Therapy

## 2013-05-12 ENCOUNTER — Encounter: Payer: Self-pay | Admitting: Internal Medicine

## 2013-05-12 ENCOUNTER — Ambulatory Visit (INDEPENDENT_AMBULATORY_CARE_PROVIDER_SITE_OTHER): Payer: BC Managed Care – PPO | Admitting: Internal Medicine

## 2013-05-12 VITALS — BP 140/86 | HR 81 | Temp 97.6°F | Resp 20 | Wt 153.0 lb

## 2013-05-12 DIAGNOSIS — S32401D Unspecified fracture of right acetabulum, subsequent encounter for fracture with routine healing: Secondary | ICD-10-CM

## 2013-05-12 DIAGNOSIS — I10 Essential (primary) hypertension: Secondary | ICD-10-CM

## 2013-05-12 DIAGNOSIS — S72009D Fracture of unspecified part of neck of unspecified femur, subsequent encounter for closed fracture with routine healing: Secondary | ICD-10-CM

## 2013-05-12 MED ORDER — LISINOPRIL-HYDROCHLOROTHIAZIDE 20-12.5 MG PO TABS: 1.0000 | ORAL_TABLET | Freq: Every day | ORAL | Status: DC

## 2013-05-12 NOTE — Progress Notes (Signed)
Subjective:    Patient ID: Leslie Cox, male    DOB: 01/22/1946, 67 y.o.   MRN: [id number]  HPI Pre-visit discussion using our clinic review tool. No additional management support is needed unless otherwise documented below in the visit note.   67 year old patient who has a history of a right acetabular fracture. He has a surgical scar in the suprapubic area and has a small nodule at the right end of the scar. This is what prompted the visit today He has hypertension controlled on lisinopril. Blood pressure has been running in a high normal range  Past Medical History  Diagnosis Date  . Hypertension   . GERD (gastroesophageal reflux disease)     History   Social History  . Marital Status: Married    Spouse Name: N/A    Number of Children: N/A  . Years of Education: N/A   Occupational History  . 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 Smoking status: Never Smoker   . Smokeless tobacco: Never Used  . Alcohol Use: No  . Drug Use: No  . Sexual Activity: Not on file   Other Topics Concern  . Not on file   Social History Narrative  . No narrative on file    Past Surgical History  Procedure Laterality Date  . Tonsillectomy    . External fixation leg Right 09/05/2012    Procedure: Application of traction bow externally;  Surgeon: Shelda Pal, MD;  Location[facility] OR;  Service: Orthopedics;  Laterality: Right;  . Orif acetabular fracture Right 09/08/2012    Procedure: OPEN REDUCTION INTERNAL FIXATION (ORIF) ACETABULAR FRACTURE;  Surgeon: Budd Palmer, MD;  Location: [facility] OR;  Service: Orthopedics;  Laterality: Right;    No family history on file.  No Known Allergies  Current Outpatient Prescriptions on File Prior to Visit  Medication Sig Dispense Refill  . lisinopril (PRINIVIL,ZESTRIL) 20 MG tablet Take 1 tablet (20 mg total) by mouth daily.  90 tablet  3  . Multiple Vitamin (MULTIVITAMIN) tablet Take 1 tablet by mouth  daily.       No current facility-administered medications on file prior to visit.    BP 140/86  Pulse 81  Temp(Src) 97.6 F (36.4 C) (Oral)  Resp 20  Wt 153 lb (69.4 kg)  SpO2 98%      Review of Systems  Constitutional: Negative for fever, chills, appetite change and fatigue.  HENT: Negative for congestion, dental problem, ear pain, hearing loss, sore throat, tinnitus, trouble swallowing and voice change.   Eyes: Negative for pain, discharge and visual disturbance.  Respiratory: Negative for cough, chest tightness, wheezing and stridor.   Cardiovascular: Negative for chest pain, palpitations and leg swelling.  Gastrointestinal: Negative for nausea, vomiting, abdominal pain, diarrhea, constipation, blood in stool and abdominal distention.  Genitourinary: Negative for urgency, hematuria, flank pain, discharge, difficulty urinating and genital sores.  Musculoskeletal: Negative for arthralgias, back pain, gait problem, joint swelling, myalgias and neck stiffness.  Skin: Positive for wound. Negative for rash.  Neurological: Negative for dizziness, syncope, speech difficulty, weakness, numbness and headaches.  Hematological: Negative for adenopathy. Does not bruise/bleed easily.  Psychiatric/Behavioral: Negative for behavioral problems and dysphoric mood. The patient is not nervous/anxious.        Objective:   Physical Exam  Constitutional: He appears well-developed and well-nourished. No distress.  Blood pressure 140/90  Skin:  5 mm nodule at the right end of a surgical scar  in the suprapubic area          Assessment & Plan:   Hypertension suboptimal control. We'll add the hydrochlorothiazide to the lisinopril Surgical scar. Patient reassured

## 2013-05-12 NOTE — Patient Instructions (Signed)
Limit your sodium (Salt) intake  Please check your blood pressure on a regular basis.  If it is consistently greater than 150/90, please make an office appointment.    It is important that you exercise regularly, at least 20 minutes 3 to 4 times per week.  If you develop chest pain or shortness of breath seek  medical attention.  Return in 6 months for follow-up  

## 2013-05-12 NOTE — Progress Notes (Signed)
  Subjective:    Patient ID: Leslie Cox, male    DOB: 06-09-1946, 67 y.o.   MRN: [id number]  HPI  BP Readings from Last 3 Encounters:  05/12/13 140/86  01/18/13 140/90  11/09/12 159/94    Review of Systems     Objective:   Physical Exam        Assessment & Plan:

## 2013-08-13 ENCOUNTER — Encounter (INDEPENDENT_AMBULATORY_CARE_PROVIDER_SITE_OTHER): Payer: Self-pay

## 2013-08-13 ENCOUNTER — Encounter: Payer: Self-pay | Admitting: Internal Medicine

## 2014-01-20 ENCOUNTER — Telehealth: Payer: Self-pay | Admitting: Internal Medicine

## 2014-01-20 NOTE — Telephone Encounter (Signed)
appt scheduled for pt.  

## 2014-01-20 NOTE — Telephone Encounter (Signed)
Ok per Dr Raliegh Ip

## 2014-01-20 NOTE — Telephone Encounter (Signed)
Pt is needing a phy before sept 1 based on his insurance, ok to use two slots?

## 2014-02-04 ENCOUNTER — Other Ambulatory Visit (INDEPENDENT_AMBULATORY_CARE_PROVIDER_SITE_OTHER): Payer: BC Managed Care – PPO

## 2014-02-04 DIAGNOSIS — Z0000 Encounter for general adult medical examination without abnormal findings: Secondary | ICD-10-CM

## 2014-02-04 LAB — CBC WITH DIFFERENTIAL/PLATELET
BASOS PCT: 0.5 % (ref 0.0–3.0)
Basophils Absolute: 0 10*3/uL (ref 0.0–0.1)
EOS PCT: 2.4 % (ref 0.0–5.0)
Eosinophils Absolute: 0.1 10*3/uL (ref 0.0–0.7)
HCT: 43.8 % (ref 39.0–52.0)
Hemoglobin: 14.9 g/dL (ref 13.0–17.0)
LYMPHS PCT: 26.4 % (ref 12.0–46.0)
Lymphs Abs: 1.4 10*3/uL (ref 0.7–4.0)
MCHC: 34 g/dL (ref 30.0–36.0)
MCV: 91.2 fl (ref 78.0–100.0)
MONOS PCT: 7.7 % (ref 3.0–12.0)
Monocytes Absolute: 0.4 10*3/uL (ref 0.1–1.0)
NEUTROS PCT: 63 % (ref 43.0–77.0)
Neutro Abs: 3.3 10*3/uL (ref 1.4–7.7)
Platelets: 218 10*3/uL (ref 150.0–400.0)
RBC: 4.81 Mil/uL (ref 4.22–5.81)
RDW: 12.9 % (ref 11.5–15.5)
WBC: 5.2 10*3/uL (ref 4.0–10.5)

## 2014-02-04 LAB — POCT URINALYSIS DIPSTICK
BILIRUBIN UA: NEGATIVE
Glucose, UA: NEGATIVE
Ketones, UA: NEGATIVE
LEUKOCYTES UA: NEGATIVE
NITRITE UA: NEGATIVE
PROTEIN UA: NEGATIVE
RBC UA: NEGATIVE
Spec Grav, UA: 1.015
UROBILINOGEN UA: 0.2
pH, UA: 6.5

## 2014-02-04 LAB — TSH: TSH: 1.1 u[IU]/mL (ref 0.35–4.50)

## 2014-02-04 LAB — LIPID PANEL
CHOL/HDL RATIO: 6
Cholesterol: 186 mg/dL (ref 0–200)
HDL: 32.3 mg/dL — AB (ref >39.00)
LDL Cholesterol: 127 mg/dL — ABNORMAL HIGH (ref 0–99)
NONHDL: 153.7
Triglycerides: 133 mg/dL (ref 0.0–149.0)
VLDL: 26.6 mg/dL (ref 0.0–40.0)

## 2014-02-04 LAB — BASIC METABOLIC PANEL
BUN: 18 mg/dL (ref 6–23)
CALCIUM: 9 mg/dL (ref 8.4–10.5)
CHLORIDE: 97 meq/L (ref 96–112)
CO2: 28 mEq/L (ref 19–32)
CREATININE: 0.6 mg/dL (ref 0.4–1.5)
GFR: 140.72 mL/min (ref >60.00)
Glucose, Bld: 93 mg/dL (ref 70–99)
Potassium: 4.2 mEq/L (ref 3.5–5.1)
Sodium: 134 mEq/L — ABNORMAL LOW (ref 135–145)

## 2014-02-04 LAB — HEPATIC FUNCTION PANEL
ALK PHOS: 49 U/L (ref 39–117)
ALT: 15 U/L (ref 0–53)
AST: 18 U/L (ref 0–37)
Albumin: 4.1 g/dL (ref 3.5–5.2)
BILIRUBIN DIRECT: 0.2 mg/dL (ref 0.0–0.3)
BILIRUBIN TOTAL: 1.1 mg/dL (ref 0.2–1.2)
TOTAL PROTEIN: 6.5 g/dL (ref 6.0–8.3)

## 2014-02-04 LAB — PSA: PSA: 2.3 ng/mL (ref 0.10–4.00)

## 2014-02-11 ENCOUNTER — Encounter: Payer: BC Managed Care – PPO | Admitting: Internal Medicine

## 2014-02-16 ENCOUNTER — Telehealth: Payer: Self-pay | Admitting: Internal Medicine

## 2014-02-16 ENCOUNTER — Encounter: Payer: Self-pay | Admitting: Internal Medicine

## 2014-02-16 ENCOUNTER — Ambulatory Visit (INDEPENDENT_AMBULATORY_CARE_PROVIDER_SITE_OTHER): Payer: BC Managed Care – PPO | Admitting: Internal Medicine

## 2014-02-16 VITALS — BP 110/70 | HR 77 | Temp 98.1°F | Resp 20 | Ht 66.0 in | Wt 148.0 lb

## 2014-02-16 DIAGNOSIS — I10 Essential (primary) hypertension: Secondary | ICD-10-CM

## 2014-02-16 DIAGNOSIS — Z0000 Encounter for general adult medical examination without abnormal findings: Secondary | ICD-10-CM

## 2014-02-16 DIAGNOSIS — Z23 Encounter for immunization: Secondary | ICD-10-CM

## 2014-02-16 DIAGNOSIS — IMO0002 Reserved for concepts with insufficient information to code with codable children: Secondary | ICD-10-CM

## 2014-02-16 DIAGNOSIS — S32401S Unspecified fracture of right acetabulum, sequela: Secondary | ICD-10-CM

## 2014-02-16 MED ORDER — LISINOPRIL-HYDROCHLOROTHIAZIDE 20-12.5 MG PO TABS: 1.0000 | ORAL_TABLET | Freq: Every day | ORAL | Status: DC

## 2014-02-16 NOTE — Progress Notes (Signed)
Pre visit review using our clinic review tool, if applicable. No additional management support is needed unless otherwise documented below in the visit note. 

## 2014-02-16 NOTE — Progress Notes (Signed)
Patient ID: Nihaal Friesen, male   DOB: 11-27-1945, 68 y.o.   MRN: [id number]   Subjective:    Patient ID: Kenzie Flakes, male    DOB: 1945-12-10, 68 y.o.   MRN: [id number]  HPI  Subjective:    Patient ID: Aidan Moten, male    DOB: [birth year], 68 y.o.   MRN: [id number]  HPI  68 year old patient who is seen today for a wellness exam.  He enjoys excellent health. There has been a history of hypertension.  Epigastric part of the back and down 4 was required to be faxed to He does monitor blood pressure with normal results.  Since his last physical he required surgery for a right acetabular fracture.  He did have a screening colonoscopy by Dr. 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  HENT: Negative for hearing loss, ear pain, congestion, rhinorrhea, sneezing, mouth sores, trouble swallowing, neck pain, neck stiffness, dental problem, voice change, sinus pressure and tinnitus.   Eyes: Negative for photophobia, pain, redness and visual disturbance.  Respiratory: Negative for apnea, cough, choking, chest tightness, shortness of breath and wheezing.   Cardiovascular: Negative for chest pain, palpitations and leg swelling.  Gastrointestinal: Negative for nausea, vomiting, abdominal pain, diarrhea, constipation, blood in stool, abdominal distention, anal  bleeding and rectal pain.  Genitourinary: Negative for dysuria, urgency, frequency, hematuria, flank pain, decreased urine volume, discharge, penile swelling, scrotal swelling, difficulty urinating, genital sores and testicular pain.  Musculoskeletal: Negative for myalgias, back pain, joint swelling, arthralgias and gait problem.  Skin: Negative for color change, rash and wound.  Neurological: Negative for dizziness, tremors, seizures, syncope, facial asymmetry, speech difficulty, weakness, light-headedness, numbness and headaches.  Hematological: Negative for adenopathy. Does not bruise/bleed easily.  Psychiatric/Behavioral: Negative for suicidal ideas, hallucinations, behavioral problems, confusion, sleep disturbance, self-injury, dysphoric mood, decreased concentration and agitation. The patient is not nervous/anxious.        Objective:   Physical Exam  Constitutional: He appears well-developed and well-nourished.  HENT:  Head: Normocephalic and atraumatic.  Right Ear: External ear normal.  Left Ear: External ear normal.  Nose: Nose normal.  Mouth/Throat: Oropharynx is clear and moist.  Eyes: Conjunctivae and EOM are normal. Pupils are equal, round, and reactive to light. No scleral icterus.  Neck: Normal range of motion. Neck supple. No JVD present. No thyromegaly present.  Cardiovascular: Regular rhythm, normal heart sounds and intact distal pulses.  Exam reveals no gallop and no friction rub.   No murmur heard. Pulmonary/Chest: Effort normal and breath sounds normal. He exhibits no tenderness.  Abdominal: Soft. Bowel sounds are normal. He exhibits no distension and no mass. There is no tenderness.  Genitourinary: Prostate normal and penis normal.  Musculoskeletal: Normal range of motion. He exhibits no edema and no tenderness.  Lymphadenopathy:    He has no cervical adenopathy.  Neurological: He is alert.  He has normal reflexes. No cranial nerve deficit. Coordination normal.   Skin: Skin is warm and dry. No rash noted.  Psychiatric: He has a normal mood and affect. His behavior is normal.          Assessment & Plan:   Preventive health examination Normotensive  Exercise regimen low salt diet all encouraged. He will return in one year for followup. He's been asked to monitor his blood pressure readings    Review of Systems     see above Objective:   Physical Exam  Abdominal:  Small easily reducible right inguinal hernia   BP  140/90  See above      Assessment & Plan:  Preventive health exam Hypertension.  Blood pressure in a low normal range.  Today Asymptomatic right inguinal hernia  Low-salt diet recommended; will continue home blood pressure monitoring Heart healthy diet recommended Recheck one year

## 2014-02-16 NOTE — Telephone Encounter (Signed)
Relevant patient education mailed to patient.  

## 2014-02-16 NOTE — Patient Instructions (Addendum)
Health Maintenance A healthy lifestyle and preventative care can promote health and wellness.  Maintain regular health, dental, and eye exams.  Eat a healthy diet. Foods like vegetables, fruits, whole grains, low-fat dairy products, and lean protein foods contain the nutrients you need and are low in calories. Decrease your intake of foods high in solid fats, added sugars, and salt. Get information about a proper diet from your health care provider, if necessary.  Regular physical exercise is one of the most important things you can do for your health. Most adults should get at least 150 minutes of moderate-intensity exercise (any activity that increases your heart rate and causes you to sweat) each week. In addition, most adults need muscle-strengthening exercises on 2 or more days a week.   Maintain a healthy weight. The body mass index (BMI) is a screening tool to identify possible weight problems. It provides an estimate of body fat based on height and weight. Your health care provider can find your BMI and can help you achieve or maintain a healthy weight. For males 20 years and older:  A BMI below 18.5 is considered underweight.  A BMI of 18.5 to 24.9 is normal.  A BMI of 25 to 29.9 is considered overweight.  A BMI of 30 and above is considered obese.  Maintain normal blood lipids and cholesterol by exercising and minimizing your intake of saturated fat. Eat a balanced diet with plenty of fruits and vegetables. Blood tests for lipids and cholesterol should begin at age 20 and be repeated every 5 years. If your lipid or cholesterol levels are high, you are over age 50, or you are at high risk for heart disease, you may need your cholesterol levels checked more frequently.Ongoing high lipid and cholesterol levels should be treated with medicines if diet and exercise are not working.  If you smoke, find out from your health care provider how to quit. If you do not use tobacco, do not  start.  Lung cancer screening is recommended for adults aged 55-80 years who are at high risk for developing lung cancer because of a history of smoking. A yearly low-dose CT scan of the lungs is recommended for people who have at least a 30-pack-year history of smoking and are current smokers or have quit within the past 15 years. A pack year of smoking is smoking an average of 1 pack of cigarettes a day for 1 year (for example, a 30-pack-year history of smoking could mean smoking 1 pack a day for 30 years or 2 packs a day for 15 years). Yearly screening should continue until the smoker has stopped smoking for at least 15 years. Yearly screening should be stopped for people who develop a health problem that would prevent them from having lung cancer treatment.  If you choose to drink alcohol, do not have more than 2 drinks per day. One drink is considered to be 12 oz (360 mL) of beer, 5 oz (150 mL) of wine, or 1.5 oz (45 mL) of liquor.  Avoid the use of street drugs. Do not share needles with anyone. Ask for help if you need support or instructions about stopping the use of drugs.  High blood pressure causes heart disease and increases the risk of stroke. Blood pressure should be checked at least every 1-2 years. Ongoing high blood pressure should be treated with medicines if weight loss and exercise are not effective.  If you are 45-79 years old, ask your health care provider if   you should take aspirin to prevent heart disease.  Diabetes screening involves taking a blood sample to check your fasting blood sugar level. This should be done once every 3 years after age 45 if you are at a normal weight and without risk factors for diabetes. Testing should be considered at a younger age or be carried out more frequently if you are overweight and have at least 1 risk factor for diabetes.  Colorectal cancer can be detected and often prevented. Most routine colorectal cancer screening begins at the age of 50  and continues through age 75. However, your health care provider may recommend screening at an earlier age if you have risk factors for colon cancer. On a yearly basis, your health care provider may provide home test kits to check for hidden blood in the stool. A small camera at the end of a tube may be used to directly examine the colon (sigmoidoscopy or colonoscopy) to detect the earliest forms of colorectal cancer. Talk to your health care provider about this at age 50 when routine screening begins. A direct exam of the colon should be repeated every 5-10 years through age 75, unless early forms of precancerous polyps or small growths are found.  People who are at an increased risk for hepatitis B should be screened for this virus. You are considered at high risk for hepatitis B if:  You were born in a country where hepatitis B occurs often. Talk with your health care provider about which countries are considered high risk.  Your parents were born in a high-risk country and you have not received a shot to protect against hepatitis B (hepatitis B vaccine).  You have HIV or AIDS.  You use needles to inject street drugs.  You live with, or have sex with, someone who has hepatitis B.  You are a man who has sex with other men (MSM).  You get hemodialysis treatment.  You take certain medicines for conditions like cancer, organ transplantation, and autoimmune conditions.  Hepatitis C blood testing is recommended for all people born from 1945 through 1965 and any individual with known risk factors for hepatitis C.  Healthy men should no longer receive prostate-specific antigen (PSA) blood tests as part of routine cancer screening. Talk to your health care provider about prostate cancer screening.  Testicular cancer screening is not recommended for adolescents or adult males who have no symptoms. Screening includes self-exam, a health care provider exam, and other screening tests. Consult with your  health care provider about any symptoms you have or any concerns you have about testicular cancer.  Practice safe sex. Use condoms and avoid high-risk sexual practices to reduce the spread of sexually transmitted infections (STIs).  You should be screened for STIs, including gonorrhea and chlamydia if:  You are sexually active and are younger than 24 years.  You are older than 24 years, and your health care provider tells you that you are at risk for this type of infection.  Your sexual activity has changed since you were last screened, and you are at an increased risk for chlamydia or gonorrhea. Ask your health care provider if you are at risk.  If you are at risk of being infected with HIV, it is recommended that you take a prescription medicine daily to prevent HIV infection. This is called pre-exposure prophylaxis (PrEP). You are considered at risk if:  You are a man who has sex with other men (MSM).  You are a heterosexual man who   is sexually active with multiple partners.  You take drugs by injection.  You are sexually active with a partner who has HIV.  Talk with your health care provider about whether you are at high risk of being infected with HIV. If you choose to begin PrEP, you should first be tested for HIV. You should then be tested every 3 months for as long as you are taking PrEP.  Use sunscreen. Apply sunscreen liberally and repeatedly throughout the day. You should seek shade when your shadow is shorter than you. Protect yourself by wearing long sleeves, pants, a wide-brimmed hat, and sunglasses year round whenever you are outdoors.  Tell your health care provider of new moles or changes in moles, especially if there is a change in shape or color. Also, tell your health care provider if a mole is larger than the size of a pencil eraser.  A one-time screening for abdominal aortic aneurysm (AAA) and surgical repair of large AAAs by ultrasound is recommended for men aged  68-75 years who are current or former smokers.  Stay current with your vaccines (immunizations). Document Released: 12/14/2007 Document Revised: 06/22/2013 Document Reviewed: 11/12/2010 [facility] Patient Information 2015 [place], [place]. This information is not intended to replace advice given to you by your health care provider. Make sure you discuss any questions you have with your health care provider. Cardiac Diet This diet can help prevent heart disease and stroke. Many factors influence your heart health, including eating and exercise habits. Coronary risk rises a lot with abnormal blood fat (lipid) levels. Cardiac meal planning includes limiting unhealthy fats, increasing healthy fats, and making other small dietary changes. General guidelines are as follows:  Adjust calorie intake to reach and maintain desirable body weight.  Limit total fat intake to less than 30% of total calories. Saturated fat should be less than 7% of calories.  Saturated fats are found in animal products and in some vegetable products. Saturated vegetable fats are found in coconut oil, cocoa butter, palm oil, and palm kernel oil. Read labels carefully to avoid these products as much as possible. Use butter in moderation. Choose tub margarines and oils that have 2 grams of fat or less. Good cooking oils are canola and olive oils.  Practice low-fat cooking techniques. Do not fry food. Instead, broil, bake, boil, steam, grill, roast on a rack, stir-fry, or microwave it. Other fat reducing suggestions include:  Remove the skin from poultry.  Remove all visible fat from meats.  Skim the fat off stews, soups, and gravies before serving them.  Steam vegetables in water or broth instead of sauting them in fat.  Avoid foods with trans fat (or hydrogenated oils), such as commercially fried foods and commercially baked goods. Commercial shortening and deep-frying fats will contain trans fat.  Increase intake of fruits,  vegetables, whole grains, and legumes to replace foods high in fat.  Increase consumption of nuts, legumes, and seeds to at least 4 servings weekly. One serving of a legume equals  cup, and 1 serving of nuts or seeds equals  cup.  Choose whole grains more often. Have 3 servings per day (a serving is 1 ounce [oz]).  Eat 4 to 5 servings of vegetables per day. A serving of vegetables is 1 cup of raw leafy vegetables;  cup of raw or cooked cut-up vegetables;  cup of vegetable juice.  Eat 4 to 5 servings of fruit per day. A serving of fruit is 1 medium whole fruit;  cup of  dried fruit;  cup of fresh, frozen, or canned fruit;  cup of 100% fruit juice.  Increase your intake of dietary fiber to 20 to 30 grams per day. Insoluble fiber may help lower your risk of heart disease and may help curb your appetite.  Soluble fiber binds cholesterol to be removed from the blood. Foods high in soluble fiber are dried beans, citrus fruits, oats, apples, bananas, broccoli, Brussels sprouts, and eggplant.  Try to include foods fortified with plant sterols or stanols, such as yogurt, breads, juices, or margarines. Choose several fortified foods to achieve a daily intake of 2 to 3 grams of plant sterols or stanols.  Foods with omega-3 fats can help reduce your risk of heart disease. Aim to have a 3.5 oz portion of fatty fish twice per week, such as salmon, mackerel, albacore tuna, sardines, lake trout, or herring. If you wish to take a fish oil supplement, choose one that contains 1 gram of both DHA and EPA.  Limit processed meats to 2 servings (3 oz portion) weekly.  Limit the sodium in your diet to 1500 milligrams (mg) per day. If you have high blood pressure, talk to a registered dietitian about a DASH (Dietary Approaches to Stop Hypertension) eating plan.  Limit sweets and beverages with added sugar, such as soda, to no more than 5 servings per week. One serving is:   1 tablespoon sugar.  1 tablespoon  jelly or jam.   cup sorbet.  1 cup lemonade.   cup regular soda. CHOOSING FOODS Starches  Allowed: Breads: All kinds (wheat, rye, raisin, white, oatmeal, [place], [place], and English muffin bread). Low-fat rolls: English muffins, frankfurter and hamburger buns, bagels, pita bread, tortillas (not fried). Pancakes, waffles, biscuits, and muffins made with recommended oil.  Avoid: Products made with saturated or trans fats, oils, or whole milk products. Butter rolls, cheese breads, croissants. Commercial doughnuts, muffins, sweet rolls, biscuits, waffles, pancakes, store-bought mixes. Crackers  Allowed: Low-fat crackers and snacks: Animal, graham, rye, saltine (with recommended oil, no lard), oyster, and matzo crackers. Bread sticks, melba toast, rusks, flatbread, pretzels, and light popcorn.  Avoid: High-fat crackers: cheese crackers, butter crackers, and those made with coconut, palm oil, or trans fat (hydrogenated oils). Buttered popcorn. Cereals  Allowed: Hot or cold whole-grain cereals.  Avoid: Cereals containing coconut, hydrogenated vegetable fat, or animal fat. Potatoes / Pasta / Rice  Allowed: All kinds of potatoes, rice, and pasta (such as macaroni, spaghetti, and noodles).  Avoid: Pasta or rice prepared with cream sauce or high-fat cheese. Chow mein noodles, [place] fries. Vegetables  Allowed: All vegetables and vegetable juices.  Avoid: Fried vegetables. Vegetables in cream, butter, or high-fat cheese sauces. Limit coconut. Fruit in cream or custard. Protein  Allowed: Limit your intake of meat, seafood, and poultry to no more than 6 oz (cooked weight) per day. All lean, well-trimmed beef, veal, pork, and lamb. All chicken and [place] without skin. All fish and shellfish. Wild game: wild duck, rabbit, pheasant, and venison. Egg whites or low-cholesterol egg substitutes may be used as desired. Meatless dishes: recipes with dried beans, peas, lentils, and tofu (soybean curd).  Seeds and nuts: all seeds and most nuts.  Avoid: Prime grade and other heavily marbled and fatty meats, such as short ribs, spare ribs, rib eye roast or steak, frankfurters, sausage, bacon, and high-fat luncheon meats, mutton. Caviar. Commercially fried fish. Domestic duck, goose, venison sausage. Organ meats: liver, gizzard, heart, chitterlings, brains, kidney, sweetbreads. Dairy  Allowed: Low-fat cheeses: nonfat  or low-fat cottage cheese (1% or 2% fat), cheeses made with part skim milk, such as mozzarella, farmers, string, or ricotta. (Cheeses should be labeled no more than 2 to 6 grams fat per oz.). Skim (or 1%) milk: liquid, powdered, or evaporated. Buttermilk made with low-fat milk. Drinks made with skim or low-fat milk or cocoa. Chocolate milk or cocoa made with skim or low-fat (1%) milk. Nonfat or low-fat yogurt.  Avoid: Whole milk cheeses, including colby, cheddar, [place], Monterey Jack, [place], [place], [place], American, Swiss, and blue. Creamed cottage cheese, cream cheese. Whole milk and whole milk products, including buttermilk or yogurt made from whole milk, drinks made from whole milk. Condensed milk, evaporated whole milk, and 2% milk. Soups and Combination Foods  Allowed: Low-fat low-sodium soups: broth, dehydrated soups, homemade broth, soups with the fat removed, homemade cream soups made with skim or low-fat milk. Low-fat spaghetti, lasagna, chili, and Spanish rice if low-fat ingredients and low-fat cooking techniques are used.  Avoid: Cream soups made with whole milk, cream, or high-fat cheese. All other soups. Desserts and Sweets  Allowed: Sherbet, fruit ices, gelatins, meringues, and angel food cake. Homemade desserts with recommended fats, oils, and milk products. Jam, jelly, honey, marmalade, sugars, and syrups. Pure sugar candy, such as gum drops, hard candy, jelly beans, marshmallows, mints, and small amounts of dark chocolate.  Avoid: Commercially prepared cakes, pies,  cookies, frosting, pudding, or mixes for these products. Desserts containing whole milk products, chocolate, coconut, lard, palm oil, or palm kernel oil. Ice cream or ice cream drinks. Candy that contains chocolate, coconut, butter, hydrogenated fat, or unknown ingredients. Buttered syrups. Fats and Oils  Allowed: Vegetable oils: safflower, sunflower, corn, soybean, cottonseed, sesame, canola, olive, or peanut. Non-hydrogenated margarines. Salad dressing or mayonnaise: homemade or commercial, made with a recommended oil. Low or nonfat salad dressing or mayonnaise.  Limit added fats and oils to 6 to 8 tsp per day (includes fats used in cooking, baking, salads, and spreads on bread). Remember to count the "hidden fats" in foods.  Avoid: Solid fats and shortenings: butter, lard, salt pork, bacon drippings. Gravy containing meat fat, shortening, or suet. Cocoa butter, coconut. Coconut oil, palm oil, palm kernel oil, or hydrogenated oils: these ingredients are often used in bakery products, nondairy creamers, whipped toppings, candy, and commercially fried foods. Read labels carefully. Salad dressings made of unknown oils, sour cream, or cheese, such as blue cheese and Roquefort. Cream, all kinds: half-and-half, light, heavy, or whipping. Sour cream or cream cheese (even if "light" or low-fat). Nondairy cream substitutes: coffee creamers and sour cream substitutes made with palm, palm kernel, hydrogenated oils, or coconut oil. Beverages  Allowed: Coffee (regular or decaffeinated), tea. Diet carbonated beverages, mineral water. Alcohol: Check with your caregiver. Moderation is recommended.  Avoid: Whole milk, regular sodas, and juice drinks with added sugar. Condiments  Allowed: All seasonings and condiments. Cocoa powder. "Cream" sauces made with recommended ingredients.  Avoid: Carob powder made with hydrogenated fats. SAMPLE MENU Breakfast   cup orange juice   cup oatmeal  1 slice toast  1  tsp margarine  1 cup skim milk Lunch  [place] sandwich with 2 oz [place], 2 slices bread  Lettuce and tomato slices  Fresh fruit  Carrot sticks  Coffee or tea Snack  Fresh fruit or low-fat crackers Dinner  3 oz lean ground beef  1 baked potato  1 tsp margarine   cup asparagus  Lettuce salad  1 tbs non-creamy dressing   cup peach slices  1 cup skim milk Document Released: 03/26/2008 Document Revised: 12/17/2011 Document Reviewed: 08/17/2013 [employer] Patient Information 2015 [place], [place]. This information is not intended to replace advice given to you by your health care provider. Make sure you discuss any questions you have with your health care provider.  Limit your sodium (Salt) intake  Please check your blood pressure on a regular basis.  If it is consistently greater than 150/90, please make an office appointment.  Return in one year for follow-up

## 2014-02-22 ENCOUNTER — Telehealth: Payer: Self-pay | Admitting: Internal Medicine

## 2014-02-22 DIAGNOSIS — K4090 Unilateral inguinal hernia, without obstruction or gangrene, not specified as recurrent: Secondary | ICD-10-CM

## 2014-02-22 NOTE — Telephone Encounter (Signed)
Pt states dr Raliegh Ip told him he had hernia. Pt is ready for referral to a surgeon to talk care of this

## 2014-02-22 NOTE — Telephone Encounter (Signed)
Pt states he hurts and wants to know if there is somewhere else he can go. Pt wants to know something asap.

## 2014-02-22 NOTE — Telephone Encounter (Signed)
Pt has decided he would like dr k to take look at him again and get his opinion since he is having additional  pain. appt made for in the am at 9:45.

## 2014-02-22 NOTE — Telephone Encounter (Signed)
I  called pt to inform him that 03-08-2014 appt with [facility]  Is the soonest that they could see him , I  Also informed patient if he wanted to be seen elsewhere it may be longer before he can get an appt . And that he should keep his scheduled appointment .  I  Did advise the patient if he starts to experience pain and discomfort he should  notify his doctor . Pt understood and will keep his scheduled appointment with [facility]

## 2014-02-22 NOTE — Telephone Encounter (Signed)
Okay to due referral to General Surgeon?

## 2014-02-22 NOTE — Telephone Encounter (Signed)
Please call pt and offer appointment tomorrow morning with Dr. Raliegh Ip for right groin pain.

## 2014-02-22 NOTE — Telephone Encounter (Signed)
Okay to refer to Gen. surgery

## 2014-02-23 ENCOUNTER — Ambulatory Visit (INDEPENDENT_AMBULATORY_CARE_PROVIDER_SITE_OTHER): Payer: BC Managed Care – PPO | Admitting: Internal Medicine

## 2014-02-23 ENCOUNTER — Encounter: Payer: Self-pay | Admitting: Internal Medicine

## 2014-02-23 VITALS — BP 118/65 | HR 67 | Temp 97.8°F | Resp 18 | Ht 66.0 in | Wt 152.0 lb

## 2014-02-23 DIAGNOSIS — K4090 Unilateral inguinal hernia, without obstruction or gangrene, not specified as recurrent: Secondary | ICD-10-CM

## 2014-02-23 MED ORDER — TRAMADOL HCL 50 MG PO TABS: 50.0000 mg | ORAL_TABLET | Freq: Three times a day (TID) | ORAL | Status: DC | PRN

## 2014-02-23 NOTE — Patient Instructions (Signed)
General surgical evaluation as scheduled  Hernia A hernia occurs when an internal organ pushes out through a weak spot in the abdominal wall. Hernias most commonly occur in the groin and around the navel. Hernias often can be pushed back into place (reduced). Most hernias tend to get worse over time. Some abdominal hernias can get stuck in the opening (irreducible or incarcerated hernia) and cannot be reduced. An irreducible abdominal hernia which is tightly squeezed into the opening is at risk for impaired blood supply (strangulated hernia). A strangulated hernia is a medical emergency. Because of the risk for an irreducible or strangulated hernia, surgery may be recommended to repair a hernia. CAUSES   Heavy lifting.  Prolonged coughing.  Straining to have a bowel movement.  A cut (incision) made during an abdominal surgery. HOME CARE INSTRUCTIONS   Bed rest is not required. You may continue your normal activities.  Avoid lifting more than 10 pounds (4.5 kg) or straining.  Cough gently. If you are a smoker it is best to stop. Even the best hernia repair can break down with the continual strain of coughing. Even if you do not have your hernia repaired, a cough will continue to aggravate the problem.  Do not wear anything tight over your hernia. Do not try to keep it in with an outside bandage or truss. These can damage abdominal contents if they are trapped within the hernia sac.  Eat a normal diet.  Avoid constipation. Straining over long periods of time will increase hernia size and encourage breakdown of repairs. If you cannot do this with diet alone, stool softeners may be used. SEEK IMMEDIATE MEDICAL CARE IF:   You have a fever.  You develop increasing abdominal pain.  You feel nauseous or vomit.  Your hernia is stuck outside the abdomen, looks discolored, feels hard, or is tender.  You have any changes in your bowel habits or in the hernia that are unusual for you.  You  have increased pain or swelling around the hernia.  You cannot push the hernia back in place by applying gentle pressure while lying down. MAKE SURE YOU:   Understand these instructions.  Will watch your condition.  Will get help right away if you are not doing well or get worse. Document Released: 06/17/2005 Document Revised: 09/09/2011 Document Reviewed: 02/04/2008 [facility] Patient Information 2015 [place], [place]. This information is not intended to replace advice given to you by your health care provider. Make sure you discuss any questions you have with your health care provider. Hernia Repair Care After These instructions give you information on caring for yourself after your procedure. Your doctor may also give you more specific instructions. Call your doctor if you have any problems or questions after your procedure. HOME CARE   You may have changes in your poops (bowel movements).  You may have loose or watery poop (diarrhea).  You may be not able to poop.  Your bowels will slowly get back to normal.  Do not eat any food that makes you sick to your stomach (nauseous). Eat small meals 4 to 6 times a day instead of 3 large ones.  Do not drink pop. It will give you gas.  Do not drink alcohol.  Do not lift anything heavier than 10 pounds. This is about the weight of a gallon of milk.  Do not do anything that makes you very tired for at least 6 weeks.  Do not get your wound wet for 2 days.  You may take a sponge bath during this time.  After 2 days you may take a shower. Gently pat your surgical cut (incision) dry with a towel. Do not rub it.  For men: You may have been given an athletic supporter (scrotal support) before you left the hospital. It holds your scrotum and testicles closer to your body so there is no strain on your wound. Wear the supporter until your doctor tells you that you do not need it anymore. GET HELP RIGHT AWAY IF:  You have watery poop, or  cannot poop for more than 3 days.  You feel sick to your stomach or throw up (vomit) more than 2 or 3 times.  You have temperature by mouth above 102 F (38.9 C).  You see redness or puffiness (swelling) around your wound.  You see yellowish white fluid (pus) coming from your wound.  You see a bulge or bump in your lower belly (abdomen) or near your groin.  You develop a rash, trouble breathing, or any other symptoms from medicines taken. MAKE SURE YOU:  Understand these instructions.  Will watch your condition.  Will get help right away if your are not doing well or get worse. Document Released: 05/30/2008 Document Revised: 09/09/2011 Document Reviewed: 05/30/2008 [facility] Patient Information 2015 [place], [place]. This information is not intended to replace advice given to you by your health care provider. Make sure you discuss any questions you have with your health care provider.

## 2014-02-23 NOTE — Progress Notes (Signed)
Pre visit review using our clinic review tool, if applicable. No additional management support is needed unless otherwise documented below in the visit note. 

## 2014-02-23 NOTE — Telephone Encounter (Signed)
Noted  

## 2014-03-08 ENCOUNTER — Ambulatory Visit (INDEPENDENT_AMBULATORY_CARE_PROVIDER_SITE_OTHER): Payer: BC Managed Care – PPO | Admitting: General Surgery

## 2014-08-24 ENCOUNTER — Encounter: Payer: Self-pay | Admitting: Internal Medicine

## 2014-08-24 ENCOUNTER — Ambulatory Visit (INDEPENDENT_AMBULATORY_CARE_PROVIDER_SITE_OTHER): Payer: BLUE CROSS/BLUE SHIELD | Admitting: Internal Medicine

## 2014-08-24 VITALS — BP 126/84 | HR 72 | Temp 97.7°F | Ht 66.0 in | Wt 154.0 lb

## 2014-08-24 DIAGNOSIS — I10 Essential (primary) hypertension: Secondary | ICD-10-CM

## 2014-08-24 MED ORDER — HYDROCODONE-HOMATROPINE 5-1.5 MG/5ML PO SYRP
5.0000 mL | ORAL_SOLUTION | Freq: Four times a day (QID) | ORAL | Status: DC | PRN
Start: 2014-08-24 — End: 2015-09-08

## 2014-08-24 NOTE — Progress Notes (Signed)
Subjective:    Patient ID: Leslie Cox, male    DOB: 07/11/1945, 69 y.o.   MRN: [id number]  HPI  69 year old patient who presents with a several day history of head and chest congestion and mild productive cough.  He feels generally unwell.  No documented fever.  He has been using NyQuil.  Chief complaint is cough.  He has treated hypertension which has done well.  Past Medical History  Diagnosis Date  . Hypertension   . GERD (gastroesophageal reflux disease)     History   Social History  . Marital Status: Married    Spouse Name: N/A  . Number of Children: N/A  . Years of Education: N/A   Occupational History  . [place]   Social History Main Topics  . Smoking status: Never Smoker   . Smokeless tobacco: Never Used  . Alcohol Use: No  . Drug Use: No  . Sexual Activity: Not on file   Other Topics Concern  . Not on file   Social History Narrative    Past Surgical History  Procedure Laterality Date  . Tonsillectomy    . External fixation leg Right 09/05/2012    Procedure: Application of traction bow externally;  Surgeon: Mauri Pole, MD;  Location: [place];  Service: Orthopedics;  Laterality: Right;  . Orif acetabular fracture Right 09/08/2012    Procedure: OPEN REDUCTION INTERNAL FIXATION (ORIF) ACETABULAR FRACTURE;  Surgeon: Rozanna Box, MD;  Location: [place];  Service: Orthopedics;  Laterality: Right;    No family history on file.  No Known Allergies  Current Outpatient Prescriptions on File Prior to Visit  Medication Sig Dispense Refill  . lisinopril-hydrochlorothiazide (PRINZIDE,ZESTORETIC) 20-12.5 MG per tablet Take 1 tablet by mouth daily. 90 tablet 3  . Multiple Vitamin (MULTIVITAMIN) tablet Take 1 tablet by mouth daily.    . traMADol (ULTRAM) 50 MG tablet Take 1 tablet (50 mg total) by mouth every 8 (eight) hours as needed. 30 tablet 0   No current facility-administered medications on file prior to visit.      BP 126/84 mmHg  Pulse 72  Temp(Src) 97.7 F (36.5 C) (Oral)  Ht 5\' 6"  (1.676 m)  Wt 154 lb (69.854 kg)  BMI 24.87 kg/m2     Review of Systems  Constitutional: Positive for activity change, appetite change and fatigue. Negative for fever and chills.  HENT: Positive for congestion, postnasal drip and rhinorrhea. Negative for dental problem, ear pain, hearing loss, sore throat, tinnitus, trouble swallowing and voice change.   Eyes: Negative for pain, discharge and visual disturbance.  Respiratory: Positive for cough. Negative for chest tightness, wheezing and stridor.   Cardiovascular: Negative for chest pain, palpitations and leg swelling.  Gastrointestinal: Negative for nausea, vomiting, abdominal pain, diarrhea, constipation, blood in stool and abdominal distention.  Genitourinary: Negative for urgency, hematuria, flank pain, discharge, difficulty urinating and genital sores.  Musculoskeletal: Negative for myalgias, back pain, joint swelling, arthralgias, gait problem and neck stiffness.  Skin: Negative for rash.  Neurological: Negative for dizziness, syncope, speech difficulty, weakness, numbness and headaches.  Hematological: Negative for adenopathy. Does not bruise/bleed easily.  Psychiatric/Behavioral: Negative for behavioral problems and dysphoric mood. The patient is not nervous/anxious.        Objective:   Physical Exam  Constitutional: He is oriented to person, place, and time. He appears well-developed.  HENT:  Head: Normocephalic.  Right Ear: External ear normal.  Left Ear: External ear normal.  Eyes: Conjunctivae and EOM are normal.  Neck: Normal range of motion.  Cardiovascular: Normal rate and normal heart sounds.   Pulmonary/Chest: Effort normal and breath sounds normal. No respiratory distress. He has no wheezes. He has no rales.  Abdominal: Bowel sounds are normal.  Musculoskeletal: Normal range of motion. He exhibits no edema or tenderness.   Neurological: He is alert and oriented to person, place, and time.  Psychiatric: He has a normal mood and affect. His behavior is normal.          Assessment & Plan:   Viral URI with cough.  Will treat symptomatically

## 2014-08-24 NOTE — Progress Notes (Signed)
Pre visit review using our clinic review tool, if applicable. No additional management support is needed unless otherwise documented below in the visit note. 

## 2014-08-24 NOTE — Patient Instructions (Signed)
Acute bronchitis symptoms for less than 10 days are generally not helped by antibiotics.  Take over-the-counter expectorants and cough medications such as  Mucinex DM.  Call if there is no improvement in 5 to 7 days or if  you develop worsening cough, fever, or new symptoms, such as shortness of breath or chest pain.  HOME CARE INSTRUCTIONS  Get plenty of rest.  Drink enough fluids to keep your urine clear or pale yellow (unless you have a medical condition that requires fluid restriction). Increasing fluids may help thin your respiratory secretions (sputum) and reduce chest congestion, and it will prevent dehydration.  Take medicines only as directed by your health care provider.  If you were prescribed an antibiotic medicine, finish it all even if you start to feel better.  Avoid smoking and secondhand smoke. Exposure to cigarette smoke or irritating chemicals will make bronchitis worse. If you are a smoker, consider using nicotine gum or skin patches to help control withdrawal symptoms. Quitting smoking will help your lungs heal faster.  Reduce the chances of another bout of acute bronchitis by washing your hands frequently, avoiding people with cold symptoms, and trying not to touch your hands to your mouth, nose, or eyes.  Keep all follow-up visits as directed by your health care provider.  SEEK MEDICAL CARE IF:  Your symptoms do not improve after 1 week of treatment.  SEEK IMMEDIATE MEDICAL CARE IF:  You develop an increased fever or chills.  You have chest pain.  You have severe shortness of breath.  You have bloody sputum.  You develop dehydration.  You faint or repeatedly feel like you are going to pass out.  You develop repeated vomiting.  You develop a severe headache.

## 2014-12-05 ENCOUNTER — Encounter: Payer: Self-pay | Admitting: Internal Medicine

## 2014-12-05 ENCOUNTER — Ambulatory Visit (INDEPENDENT_AMBULATORY_CARE_PROVIDER_SITE_OTHER): Payer: BLUE CROSS/BLUE SHIELD | Admitting: Internal Medicine

## 2014-12-05 VITALS — BP 150/90 | HR 89 | Temp 98.2°F | Resp 20 | Ht 66.0 in | Wt 157.0 lb

## 2014-12-05 DIAGNOSIS — I10 Essential (primary) hypertension: Secondary | ICD-10-CM | POA: Diagnosis not present

## 2014-12-05 DIAGNOSIS — S39011A Strain of muscle, fascia and tendon of abdomen, initial encounter: Secondary | ICD-10-CM

## 2014-12-05 NOTE — Progress Notes (Signed)
Subjective:    Patient ID: Leslie Cox, male    DOB: January 20, 1946, 69 y.o.   MRN: [id number]  HPI 69 year old patient who was hospitalized for a right acetabular fracture in 2014.  In August of last year he underwent a right inguinal hernia repair.  Over the past 3 days she has had some discomfort in the right groin area.  He also describes some discomfort in the left hip and left ankle area as well.  He was concerned about a recurrent hernia  Past Medical History  Diagnosis Date  . Hypertension   . GERD (gastroesophageal reflux disease)     History   Social History  . Marital Status: Married    Spouse Name: N/A  . Number of Children: N/A  . Years of Education: N/A   Occupational History  . [employer]   Social History Main Topics  . Smoking status: Never Smoker   . Smokeless tobacco: Never Used  . Alcohol Use: No  . Drug Use: No  . Sexual Activity: Not on file   Other Topics Concern  . Not on file   Social History Narrative    Past Surgical History  Procedure Laterality Date  . Tonsillectomy    . External fixation leg Right 09/05/2012    Procedure: Application of traction bow externally;  Surgeon: Mauri Pole, MD;  Location: [place];  Service: Orthopedics;  Laterality: Right;  . Orif acetabular fracture Right 09/08/2012    Procedure: OPEN REDUCTION INTERNAL FIXATION (ORIF) ACETABULAR FRACTURE;  Surgeon: Rozanna Box, MD;  Location: [place];  Service: Orthopedics;  Laterality: Right;    No family history on file.  No Known Allergies  Current Outpatient Prescriptions on File Prior to Visit  Medication Sig Dispense Refill  . lisinopril-hydrochlorothiazide (PRINZIDE,ZESTORETIC) 20-12.5 MG per tablet Take 1 tablet by mouth daily. 90 tablet 3  . Multiple Vitamin (MULTIVITAMIN) tablet Take 1 tablet by mouth daily.    . traMADol (ULTRAM) 50 MG tablet Take 1 tablet (50 mg total) by mouth every 8 (eight) hours as needed. 30 tablet  0  . HYDROcodone-homatropine (HYCODAN) 5-1.5 MG/5ML syrup Take 5 mLs by mouth every 6 (six) hours as needed for cough. (Patient not taking: Reported on 12/05/2014) 120 mL 0   No current facility-administered medications on file prior to visit.    BP 150/90 mmHg  Pulse 89  Temp(Src) 98.2 F (36.8 C) (Oral)  Resp 20  Ht 5\' 6"  (1.676 m)  Wt 157 lb (71.215 kg)  BMI 25.35 kg/m2  SpO2 99%      Review of Systems  Constitutional: Negative for fever, chills, appetite change and fatigue.  HENT: Negative for congestion, dental problem, ear pain, hearing loss, sore throat, tinnitus, trouble swallowing and voice change.   Eyes: Negative for pain, discharge and visual disturbance.  Respiratory: Negative for cough, chest tightness, wheezing and stridor.   Cardiovascular: Negative for chest pain, palpitations and leg swelling.  Gastrointestinal: Positive for abdominal pain. Negative for nausea, vomiting, diarrhea, constipation, blood in stool and abdominal distention.  Genitourinary: Negative for urgency, hematuria, flank pain, discharge, difficulty urinating and genital sores.  Musculoskeletal: Negative for myalgias, back pain, joint swelling, arthralgias, gait problem and neck stiffness.  Skin: Negative for rash.  Neurological: Negative for dizziness, syncope, speech difficulty, weakness, numbness and headaches.  Hematological: Negative for adenopathy. Does not bruise/bleed easily.  Psychiatric/Behavioral: Negative for behavioral problems and dysphoric mood. The patient is not nervous/anxious.  Objective:   Physical Exam  Constitutional: He appears well-developed and well-nourished. No distress.  Abdominal: Soft. Bowel sounds are normal. He exhibits no distension and no mass. There is no tenderness. There is no rebound and no guarding.  Surgical scars noted in the suprapubic and right lower quadrant No obvious hernia could be appreciated          Assessment & Plan:   Right  groin strain.  Will observe at this time.  The patient does have an appointment to see his general surgeon in 2 days Hypertension.  Repeat blood pressure 140/90

## 2014-12-05 NOTE — Patient Instructions (Signed)
Follow-up with general surgery.  If unimproved or symptoms worsen  \Limit your sodium (Salt) intake  Please check your blood pressure on a regular basis.  If it is consistently greater than 150/90, please make an office appointment.  Return in 6 months for follow-up

## 2014-12-05 NOTE — Progress Notes (Signed)
Pre visit review using our clinic review tool, if applicable. No additional management support is needed unless otherwise documented below in the visit note. 

## 2014-12-06 ENCOUNTER — Telehealth: Payer: Self-pay | Admitting: Internal Medicine

## 2014-12-06 MED ORDER — TRAMADOL HCL 50 MG PO TABS: 50.0000 mg | ORAL_TABLET | Freq: Three times a day (TID) | ORAL | Status: DC | PRN

## 2014-12-06 NOTE — Telephone Encounter (Signed)
Spoke to pt, asked him if he had any Tramadol left? Pt stated no. Told pt will call Rx into the pharmacy for him to help with pain. Pt verbalized understanding.

## 2014-12-06 NOTE — Telephone Encounter (Signed)
Pt seen yesterday for rt side pain. Pt states advil is not working and would like something stronger called into  [facility]/ [place]

## 2015-02-10 ENCOUNTER — Other Ambulatory Visit: Payer: Self-pay | Admitting: Internal Medicine

## 2015-02-10 MED ORDER — LISINOPRIL-HYDROCHLOROTHIAZIDE 20-12.5 MG PO TABS: 1.0000 | ORAL_TABLET | Freq: Every day | ORAL | Status: DC

## 2015-02-10 NOTE — Telephone Encounter (Signed)
Pt need a written RX refill on lisinopril -HCTZ 20/12.5 MG Tablets for 90 days .need it fax to [phone] [facility].

## 2015-02-10 NOTE — Telephone Encounter (Signed)
Medication sent in to [facility].

## 2015-09-06 ENCOUNTER — Telehealth: Payer: Self-pay | Admitting: Internal Medicine

## 2015-09-06 NOTE — Telephone Encounter (Signed)
Yes, that is fine. 

## 2015-09-06 NOTE — Telephone Encounter (Signed)
Pt has pain where is hernia operation was and has back pain when he stands a long time. Would like to know if he can see Dr Raliegh Ip on Friday. On 2/ same day appointments left for Friday. Please advise?

## 2015-09-06 NOTE — Telephone Encounter (Signed)
Pt has been scheduled.  °

## 2015-09-08 ENCOUNTER — Ambulatory Visit (INDEPENDENT_AMBULATORY_CARE_PROVIDER_SITE_OTHER): Payer: BLUE CROSS/BLUE SHIELD | Admitting: Internal Medicine

## 2015-09-08 ENCOUNTER — Encounter: Payer: Self-pay | Admitting: Internal Medicine

## 2015-09-08 VITALS — BP 120/70 | HR 83 | Temp 98.4°F | Resp 20 | Ht 66.0 in | Wt 151.0 lb

## 2015-09-08 DIAGNOSIS — S32401D Unspecified fracture of right acetabulum, subsequent encounter for fracture with routine healing: Secondary | ICD-10-CM | POA: Diagnosis not present

## 2015-09-08 DIAGNOSIS — R1032 Left lower quadrant pain: Secondary | ICD-10-CM | POA: Diagnosis not present

## 2015-09-08 DIAGNOSIS — K4090 Unilateral inguinal hernia, without obstruction or gangrene, not specified as recurrent: Secondary | ICD-10-CM

## 2015-09-08 MED ORDER — HYDROCODONE-ACETAMINOPHEN 5-325 MG PO TABS: 1.0000 | ORAL_TABLET | Freq: Four times a day (QID) | ORAL | Status: DC | PRN

## 2015-09-08 NOTE — Progress Notes (Signed)
Subjective:    Patient ID: Leslie Cox, male    DOB: 15-Aug-1945, 70 y.o.   MRN: [id number]  HPI   70 year old patient who has remote history of a right acetabular fracture that was repaired approximately 3 years ago.  He also underwent surgery for a right inguinal hernia in August 2015.  The patient presents complaining of increasing pain in the right groin area.  He describes the pain is intermittent.  It was quite severe several days ago.  He states the pain radiates down the anterior aspect of his right leg to the knee.  He also complains of pain in the right lower back area.  He feels the 2 locations of pain are related.  He does take tramadol with minimal improvement. He has treated hypertension which has been stable  Past Medical History  Diagnosis Date  . Hypertension   . GERD (gastroesophageal reflux disease)     Social History   Social History  . Marital Status: Married    Spouse Name: N/A  . Number of Children: N/A  . Years of Education: N/A   Occupational History  . [employer]   Social History Main Topics  . Smoking status: Never Smoker   . Smokeless tobacco: Never Used  . Alcohol Use: No  . Drug Use: No  . Sexual Activity: Not on file   Other Topics Concern  . Not on file   Social History Narrative    Past Surgical History  Procedure Laterality Date  . Tonsillectomy    . External fixation leg Right 09/05/2012    Procedure: Application of traction bow externally;  Surgeon: Mauri Pole, MD;  Location: [place];  Service: Orthopedics;  Laterality: Right;  . Orif acetabular fracture Right 09/08/2012    Procedure: OPEN REDUCTION INTERNAL FIXATION (ORIF) ACETABULAR FRACTURE;  Surgeon: Rozanna Box, MD;  Location: [place];  Service: Orthopedics;  Laterality: Right;    No family history on file.  No Known Allergies  Current Outpatient Prescriptions on File Prior to Visit  Medication Sig Dispense Refill  .  lisinopril-hydrochlorothiazide (PRINZIDE,ZESTORETIC) 20-12.5 MG per tablet Take 1 tablet by mouth daily. 90 tablet 3  . Multiple Vitamin (MULTIVITAMIN) tablet Take 1 tablet by mouth daily.    . traMADol (ULTRAM) 50 MG tablet Take 1 tablet (50 mg total) by mouth every 8 (eight) hours as needed. 60 tablet 1   No current facility-administered medications on file prior to visit.    BP 120/70 mmHg  Pulse 83  Temp(Src) 98.4 F (36.9 C) (Oral)  Resp 20  Ht 5\' 6"  (1.676 m)  Wt 151 lb (68.493 kg)  BMI 24.38 kg/m2  SpO2 98%     Review of Systems  Constitutional: Negative for fever, chills, appetite change and fatigue.  HENT: Negative for congestion, dental problem, ear pain, hearing loss, sore throat, tinnitus, trouble swallowing and voice change.   Eyes: Negative for pain, discharge and visual disturbance.  Respiratory: Negative for cough, chest tightness, wheezing and stridor.   Cardiovascular: Negative for chest pain, palpitations and leg swelling.  Gastrointestinal: Negative for nausea, vomiting, abdominal pain, diarrhea, constipation, blood in stool and abdominal distention.       Right groin pain  Genitourinary: Negative for urgency, hematuria, flank pain, discharge, difficulty urinating and genital sores.  Musculoskeletal: Positive for back pain. Negative for myalgias, joint swelling, arthralgias, gait problem and neck stiffness.  Skin: Negative for rash.  Neurological: Negative for dizziness,  syncope, speech difficulty, weakness, numbness and headaches.  Hematological: Negative for adenopathy. Does not bruise/bleed easily.  Psychiatric/Behavioral: Negative for behavioral problems and dysphoric mood. The patient is not nervous/anxious.        Objective:   Physical Exam  Constitutional: He appears well-developed and well-nourished. No distress.  Abdominal:  With the patient standing, there is a slight prominence in the right femoral area compared to the left This area is  slightly tender to palpation Slight bulge in the internal inguinal canals  With  coughing bilaterally  Musculoskeletal:  Fairly intact range of motion of the right hip Negative straight leg test          Assessment & Plan:   Remote right acetabular fracture with protrusion of the femoral head.  Status post repair History of right inguinal hernia repair  Patient has some fullness in the right femoral area with slight tenderness.  Will intensify analgesics and observe at this time.  If pain persists, will have general surgery evaluate

## 2015-09-08 NOTE — Patient Instructions (Signed)
HOME CARE INSTRUCTIONS  After surgery to repair an inguinal hernia:  You will need to take pain medicine prescribed by your healthcare provider. Follow all directions carefully.  You will need to take care of the wound from the incision.  Your activity will be restricted for awhile. This will probably include no heavy lifting for several weeks. You also should not do anything too active for a few weeks. When you can return to work will depend on the type of job that you have. During "watchful waiting" periods, you should:  Maintain a healthy weight.  Eat a diet high in fiber (fruits, vegetables and whole grains).  Drink plenty of fluids to avoid constipation. This means drinking enough water and other liquids to keep your urine clear or pale yellow.  Do not lift heavy objects.  Do not stand for long periods of time.  Quit smoking. This should keep you from developing a frequent cough. SEEK MEDICAL CARE IF:  A bulge develops in your groin area.  You feel pain, a burning sensation or pressure in the groin. This might be worse if you are lifting or straining.  You develop a fever of more than 100.5 F (38.1 C). SEEK IMMEDIATE MEDICAL CARE IF:  Pain in the groin increases suddenly.  A bulge in the groin gets bigger suddenly and does not go down.  For men, there is sudden pain in the scrotum. Or, the size of the scrotum increases.  A bulge in the groin area becomes red or purple and is painful to touch.  You have nausea or vomiting that does not go away.  You feel your heart beating much faster than normal.  You cannot have a bowel movement or pass gas.  You develop a fever of more than 102.0 F (38.9 C).

## 2015-10-31 DIAGNOSIS — F321 Major depressive disorder, single episode, moderate: Secondary | ICD-10-CM | POA: Diagnosis not present

## 2015-12-29 ENCOUNTER — Encounter: Payer: Self-pay | Admitting: Internal Medicine

## 2015-12-29 ENCOUNTER — Ambulatory Visit (INDEPENDENT_AMBULATORY_CARE_PROVIDER_SITE_OTHER): Payer: BLUE CROSS/BLUE SHIELD | Admitting: Internal Medicine

## 2015-12-29 VITALS — BP 120/80 | HR 86 | Temp 98.1°F | Ht 66.0 in | Wt 152.0 lb

## 2015-12-29 DIAGNOSIS — I10 Essential (primary) hypertension: Secondary | ICD-10-CM

## 2015-12-29 DIAGNOSIS — K4090 Unilateral inguinal hernia, without obstruction or gangrene, not specified as recurrent: Secondary | ICD-10-CM | POA: Diagnosis not present

## 2015-12-29 NOTE — Progress Notes (Signed)
Subjective:    Patient ID: Leslie Cox, male    DOB: Mar 13, 1946, 70 y.o.   MRN: [id number]  HPI   70 year old patient who underwent elective right inguinal hernia repair in August 2015.  12 months ago, he was seen with a chief complaint of right groin pain.    For the past few days he has had recurrence of the right groin pain.  No unusual activities.  He has not noted a bulge.  Past Medical History  Diagnosis Date  . Hypertension   . GERD (gastroesophageal reflux disease)      Social History   Social History  . Marital Status: Married    Spouse Name: N/A  . Number of Children: N/A  . Years of Education: N/A   Occupational History  . [employer]   Social History Main Topics  . Smoking status: Never Smoker   . Smokeless tobacco: Never Used  . Alcohol Use: No  . Drug Use: No  . Sexual Activity: Not on file   Other Topics Concern  . Not on file   Social History Narrative    Past Surgical History  Procedure Laterality Date  . Tonsillectomy    . External fixation leg Right 09/05/2012    Procedure: Application of traction bow externally;  Surgeon: Mauri Pole, MD;  Location: [facility];  Service: Orthopedics;  Laterality: Right;  . Orif acetabular fracture Right 09/08/2012    Procedure: OPEN REDUCTION INTERNAL FIXATION (ORIF) ACETABULAR FRACTURE;  Surgeon: Rozanna Box, MD;  Location: [place];  Service: Orthopedics;  Laterality: Right;    No family history on file.  No Known Allergies  Current Outpatient Prescriptions on File Prior to Visit  Medication Sig Dispense Refill  . HYDROcodone-acetaminophen (NORCO/VICODIN) 5-325 MG tablet Take 1 tablet by mouth every 6 (six) hours as needed for moderate pain. 60 tablet 0  . lisinopril-hydrochlorothiazide (PRINZIDE,ZESTORETIC) 20-12.5 MG per tablet Take 1 tablet by mouth daily. 90 tablet 3  . Multiple Vitamin (MULTIVITAMIN) tablet Take 1 tablet by mouth daily.    . traMADol (ULTRAM) 50  MG tablet Take 1 tablet (50 mg total) by mouth every 8 (eight) hours as needed. 60 tablet 1   No current facility-administered medications on file prior to visit.    BP 120/80 mmHg  Pulse 86  Temp(Src) 98.1 F (36.7 C) (Oral)  Ht 5\' 6"  (1.676 m)  Wt 152 lb (68.947 kg)  BMI 24.55 kg/m2  SpO2 98%     Review of Systems  Constitutional: Negative for fever, chills, appetite change and fatigue.  HENT: Negative for congestion, dental problem, ear pain, hearing loss, sore throat, tinnitus, trouble swallowing and voice change.   Eyes: Negative for pain, discharge and visual disturbance.  Respiratory: Negative for cough, chest tightness, wheezing and stridor.   Cardiovascular: Negative for chest pain, palpitations and leg swelling.  Gastrointestinal: Positive for abdominal pain. Negative for nausea, vomiting, diarrhea, constipation, blood in stool and abdominal distention.  Genitourinary: Negative for urgency, hematuria, flank pain, discharge, difficulty urinating and genital sores.  Musculoskeletal: Negative for myalgias, back pain, joint swelling, arthralgias, gait problem and neck stiffness.  Skin: Negative for rash.  Neurological: Negative for dizziness, syncope, speech difficulty, weakness, numbness and headaches.  Hematological: Negative for adenopathy. Does not bruise/bleed easily.  Psychiatric/Behavioral: Negative for behavioral problems and dysphoric mood. The patient is not nervous/anxious.        Objective:   Physical Exam  Constitutional: He appears  well-developed and well-nourished. No distress.  Abdominal:  Surgical scar right groin area No obvious tenderness or hernia appreciated  Genitourinary: Penis normal. No penile tenderness.          Assessment & Plan:   .  Suspect right groin pain.  No evidence of recurrent hernia.  Will treat with the Aleve and observe through the weekend.  He will call if there is any worsening or persistent symptoms  Nyoka Cowden, MD

## 2015-12-29 NOTE — Patient Instructions (Signed)
Aleve 2 tablets twice daily  Maintain a healthy weight.  Eat a diet high in fiber (fruits, vegetables and whole grains).  Drink plenty of fluids to avoid constipation. This means drinking enough water and other liquids to keep your urine clear or pale yellow.  Do not lift heavy objects.  Do not stand for long periods of time.    MEDICAL CARE IF:  A bulge develops in your groin area.  You feel pain, a burning sensation or pressure in the groin. This might be worse if you are lifting or straining.  You develop a fever of more than 100.5 F (38.1 C).

## 2015-12-29 NOTE — Progress Notes (Signed)
Pre visit review using our clinic review tool, if applicable. No additional management support is needed unless otherwise documented below in the visit note. 

## 2016-01-01 ENCOUNTER — Telehealth: Payer: Self-pay | Admitting: Internal Medicine

## 2016-01-01 NOTE — Telephone Encounter (Signed)
Accidentally closed the telephone call

## 2016-01-01 NOTE — Telephone Encounter (Signed)
The patient was seen 12/29/2015 for a hernia and came in today needing a doctor's note for work for the dates 12/31/2015 through 01/04/2016. I advised the patient that Dr. Burnice Logan was gone for today and will be back Wednesday morning till noon. I advised him to check back Wednesday or some one will give him a call when or if the note is ready.

## 2016-01-03 ENCOUNTER — Ambulatory Visit (INDEPENDENT_AMBULATORY_CARE_PROVIDER_SITE_OTHER): Payer: BLUE CROSS/BLUE SHIELD | Admitting: Internal Medicine

## 2016-01-03 ENCOUNTER — Encounter: Payer: Self-pay | Admitting: Internal Medicine

## 2016-01-03 VITALS — BP 140/80 | HR 72 | Temp 98.0°F | Resp 20 | Ht 66.0 in | Wt 154.0 lb

## 2016-01-03 DIAGNOSIS — G8918 Other acute postprocedural pain: Secondary | ICD-10-CM

## 2016-01-03 DIAGNOSIS — I10 Essential (primary) hypertension: Secondary | ICD-10-CM | POA: Diagnosis not present

## 2016-01-03 DIAGNOSIS — R1031 Right lower quadrant pain: Secondary | ICD-10-CM

## 2016-01-03 LAB — POCT URINALYSIS DIPSTICK
Bilirubin, UA: NEGATIVE
GLUCOSE UA: NEGATIVE
Ketones, UA: NEGATIVE
LEUKOCYTES UA: NEGATIVE
NITRITE UA: NEGATIVE
Protein, UA: NEGATIVE
RBC UA: NEGATIVE
Spec Grav, UA: 1.015
UROBILINOGEN UA: 0.2
pH, UA: 6

## 2016-01-03 MED ORDER — HYDROCODONE-ACETAMINOPHEN 5-325 MG PO TABS: 1.0000 | ORAL_TABLET | Freq: Four times a day (QID) | ORAL | Status: DC | PRN

## 2016-01-03 MED ORDER — LISINOPRIL-HYDROCHLOROTHIAZIDE 20-12.5 MG PO TABS: 1.0000 | ORAL_TABLET | Freq: Every day | ORAL | Status: DC

## 2016-01-03 NOTE — Patient Instructions (Signed)
Follow-up Gen. Surgery  Take pain medications as directed   Call or return to clinic prn if these symptoms worsen or fail to improve as anticipated.

## 2016-01-03 NOTE — Progress Notes (Signed)
Subjective:    Patient ID: Leslie Cox, male    DOB: 01-13-1946, 70 y.o.   MRN: [id number]  HPI   70 year old patient who is had a right femoral hernia repair approximately 2 years ago.  One year ago he experienced pain in the right inguinal area.     He was recently seen for recurrent pain in the right groin area.  He is seen today in follow-up complaining of persistent right inguinal pain.  He states the pain is present while sitting and also while standing and that it is fairly continuous.  He also complains of some increased frequent urination but no dysuria.  He has been using analgesics with little benefit.  He has been out of work for the past few days due to persistent pain.  His job requires prolonged standing throughout the day  Past Medical History  Diagnosis Date  . Hypertension   . GERD (gastroesophageal reflux disease)      Social History   Social History  . Marital Status: Married    Spouse Name: N/A  . Number of Children: N/A  . Years of Education: N/A   Occupational History  . [employer]   Social History Main Topics  . Smoking status: Never Smoker   . Smokeless tobacco: Never Used  . Alcohol Use: No  . Drug Use: No  . Sexual Activity: Not on file   Other Topics Concern  . Not on file   Social History Narrative    Past Surgical History  Procedure Laterality Date  . Tonsillectomy    . External fixation leg Right 09/05/2012    Procedure: Application of traction bow externally;  Surgeon: Mauri Pole, MD;  Location: [facility];  Service: Orthopedics;  Laterality: Right;  . Orif acetabular fracture Right 09/08/2012    Procedure: OPEN REDUCTION INTERNAL FIXATION (ORIF) ACETABULAR FRACTURE;  Surgeon: Rozanna Box, MD;  Location: [place];  Service: Orthopedics;  Laterality: Right;    No family history on file.  No Known Allergies  Current Outpatient Prescriptions on File Prior to Visit  Medication Sig Dispense  Refill  . Multiple Vitamin (MULTIVITAMIN) tablet Take 1 tablet by mouth daily.    . traMADol (ULTRAM) 50 MG tablet Take 1 tablet (50 mg total) by mouth every 8 (eight) hours as needed. 60 tablet 1   No current facility-administered medications on file prior to visit.    BP 140/80 mmHg  Pulse 72  Temp(Src) 98 F (36.7 C) (Oral)  Resp 20  Ht 5\' 6"  (1.676 m)  Wt 154 lb (69.854 kg)  BMI 24.87 kg/m2  SpO2 98%      Review of Systems  Gastrointestinal: Positive for abdominal pain. Negative for nausea, vomiting, diarrhea, constipation, abdominal distention and rectal pain.  Endocrine: Positive for polyuria.  Genitourinary: Positive for frequency. Negative for dysuria, urgency, hematuria, flank pain, decreased urine volume, discharge and genital sores.       Objective:   Physical Exam  Constitutional: He appears well-developed and well-nourished. No distress.  Abdominal: He exhibits no distension and no mass. There is no tenderness. There is no rebound and no guarding.  Surgical scar noted in the right lower quadrant Slight bulge inferior to the incision with Valsalva Genitourinary exam normal Prostate examination normal Stool hematest negative  Genitourinary: Prostate normal. Guaiac negative stool.          Assessment & Plan:    recurrent right inguinal postoperative pain .  Remote right femoral hernia repair  .  Will continue symptomatic treatment .  Will schedule follow-up with general surgery  .  A note to cover for missed work dictated  Call or return to clinic prn if these symptoms worsen or fail to improve as anticipated.   Nyoka Cowden, MD

## 2016-01-03 NOTE — Progress Notes (Signed)
Pre visit review using our clinic review tool, if applicable. No additional management support is needed unless otherwise documented below in the visit note. 

## 2016-01-03 NOTE — Telephone Encounter (Signed)
Pt needed to be seen for note. Pt added to schedule for today.

## 2016-01-04 ENCOUNTER — Telehealth: Payer: Self-pay | Admitting: Internal Medicine

## 2016-01-04 NOTE — Telephone Encounter (Signed)
Neoma Laming, can you look into this please.

## 2016-01-04 NOTE — Telephone Encounter (Signed)
The patient is wanting to know is there any where else that he can be referred to that can get him in quicker. He can't wait 2 weeks to see the specialist that he was referred to.

## 2016-01-04 NOTE — Telephone Encounter (Signed)
There is no other surgery center that can see pt in 2 weeks  Times frame informed pt of this and donna

## 2016-01-05 ENCOUNTER — Other Ambulatory Visit: Payer: Self-pay | Admitting: General Surgery

## 2016-01-05 ENCOUNTER — Ambulatory Visit
Admission: RE | Admit: 2016-01-05 | Discharge: 2016-01-05 | Disposition: A | Discharge status: Home / Self Care | Payer: BLUE CROSS/BLUE SHIELD | Source: Ambulatory Visit | Attending: General Surgery | Admitting: General Surgery

## 2016-01-05 DIAGNOSIS — R1031 Right lower quadrant pain: Secondary | ICD-10-CM

## 2016-01-05 DIAGNOSIS — K4090 Unilateral inguinal hernia, without obstruction or gangrene, not specified as recurrent: Secondary | ICD-10-CM | POA: Diagnosis not present

## 2016-01-18 DIAGNOSIS — R1031 Right lower quadrant pain: Secondary | ICD-10-CM | POA: Diagnosis not present

## 2016-02-19 ENCOUNTER — Other Ambulatory Visit: Payer: Self-pay | Admitting: Internal Medicine

## 2016-02-19 NOTE — Telephone Encounter (Signed)
Rx refill sent to pharmacy. 

## 2016-03-19 ENCOUNTER — Telehealth: Payer: Self-pay | Admitting: Internal Medicine

## 2016-03-19 DIAGNOSIS — M79673 Pain in unspecified foot: Secondary | ICD-10-CM

## 2016-03-19 NOTE — Telephone Encounter (Signed)
Pt has bcbs and would like a referral to podiatry for bottom of his foot he has pain, is it ok?

## 2016-03-19 NOTE — Telephone Encounter (Signed)
Okay to send referral. 

## 2016-03-19 NOTE — Telephone Encounter (Signed)
Spoke to pt, told him order for referral to Podiatry was sent and someone will be contacting him to schedule an appt. Pt verbalized understanding.

## 2016-03-19 NOTE — Telephone Encounter (Signed)
Okay for referral?

## 2016-03-22 DIAGNOSIS — F321 Major depressive disorder, single episode, moderate: Secondary | ICD-10-CM | POA: Diagnosis not present

## 2016-04-01 ENCOUNTER — Encounter: Payer: Self-pay | Admitting: Podiatry

## 2016-04-01 ENCOUNTER — Ambulatory Visit (INDEPENDENT_AMBULATORY_CARE_PROVIDER_SITE_OTHER): Payer: BLUE CROSS/BLUE SHIELD

## 2016-04-01 ENCOUNTER — Ambulatory Visit (INDEPENDENT_AMBULATORY_CARE_PROVIDER_SITE_OTHER): Payer: BLUE CROSS/BLUE SHIELD | Admitting: Podiatry

## 2016-04-01 DIAGNOSIS — L84 Corns and callosities: Secondary | ICD-10-CM

## 2016-04-01 DIAGNOSIS — R52 Pain, unspecified: Secondary | ICD-10-CM | POA: Diagnosis not present

## 2016-04-01 DIAGNOSIS — M2041 Other hammer toe(s) (acquired), right foot: Secondary | ICD-10-CM

## 2016-04-01 DIAGNOSIS — M722 Plantar fascial fibromatosis: Secondary | ICD-10-CM | POA: Diagnosis not present

## 2016-04-01 MED ORDER — MELOXICAM 15 MG PO TABS: 15.0000 mg | ORAL_TABLET | Freq: Every day | ORAL | 2 refills | Status: DC

## 2016-04-01 NOTE — Progress Notes (Signed)
   Subjective:    Patient ID: Leslie Cox, male    DOB: 01/06/1946, 70 y.o.   MRN: [id number]  HPI  70 year old male presents the also concerns of the left foot heel pain as well as right fifth digit pain which is on the last couple weeks. 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 He states that his right fifth toe hurts in shoes. Denies any recent injury or trauma. No swelling or redness. No numbness her daily. Pain does not wake her up at night. No previous treatment. No other complaints.   Review of Systems  HENT:       Ringing in ears  Eyes: Positive for visual disturbance.  Genitourinary: Positive for frequency.  Musculoskeletal: Positive for back pain.  All other systems reviewed and are negative.      Objective:   Physical Exam General: AAO x3, NAD  Dermatological: Hyperkeratotic lesion left foot second metatarsal 5 as well as dorsal lateral right fifth toe. Upon debridement no underlying ulceration, drainage or any signs of infection. No other open lesions are identified at this time.  Vascular: DP/PT pulses 2/4, CRT less than 3 seconds. There is no pain with calf compression, swelling, warmth, erythema.   Neruologic: Grossly intact via light touch bilateral. Vibratory intact via tuning fork bilateral. Protective threshold with Semmes Wienstein monofilament intact to all pedal sites bilateral.   Musculoskeletal: Tenderness to palpation along the plantar medial tubercle of the calcaneus at the insertion of plantar fascia on the left foot. There is no pain along the course of the plantar fascia within the arch of the foot. Plantar fascia appears to be intact. There is no pain with lateral compression of the calcaneus or pain with vibratory sensation. There is no pain along the course or insertion of the achilles tendon. Adductovarus the right fifth toe is tenderness palpation. No other areas of  tenderness bilaterally.   Gait: Unassisted, Nonantalgic.      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 Inferior calcaneal spurring is present bilaterally. No evidence of acute fracture.  -Discussed steroid injection but wishes to hold off today.  Prescribed mobic. Discussed side effects of the medication and directed to stop if any are to occur and call the office. Stretching, icing exercises daily. Shoe gear modifications. Discuss orthotics and he will start an over-the-counter insert. Plantar fascial brace dispensed.  -Hyperkeratotic lesions debrided 2 without complications or bleeding  -Discussed shoe gear modifications  -Follow-up in 3-4 weeks or sooner if needed. Call any questions concerns meantime.   Celesta Gentile, DPM

## 2016-04-01 NOTE — Patient Instructions (Signed)

## 2016-04-22 ENCOUNTER — Ambulatory Visit (INDEPENDENT_AMBULATORY_CARE_PROVIDER_SITE_OTHER): Payer: BLUE CROSS/BLUE SHIELD | Admitting: Podiatry

## 2016-04-22 ENCOUNTER — Encounter: Payer: Self-pay | Admitting: Podiatry

## 2016-04-22 DIAGNOSIS — L84 Corns and callosities: Secondary | ICD-10-CM

## 2016-04-22 DIAGNOSIS — M2041 Other hammer toe(s) (acquired), right foot: Secondary | ICD-10-CM | POA: Diagnosis not present

## 2016-04-22 DIAGNOSIS — M722 Plantar fascial fibromatosis: Secondary | ICD-10-CM | POA: Diagnosis not present

## 2016-04-22 NOTE — Patient Instructions (Signed)

## 2016-04-23 NOTE — Progress Notes (Signed)
Subjective: Leslie Cox presents to the office today for follow-up evaluation of left heel pain and 5th toe pain. They state that they are doing better. He's been icing has not been doing the stretching exercises. He does work on a [employer]. The pain of the fifth toes also resolved however he discussed that he get some callus rebuilt. No other complaints at this time. No acute changes since last appointment. They deny any systemic complaints such as fevers, chills, nausea, vomiting.  Objective: General: AAO x3, NAD  Dermatological: Mild hyperkeratotic tissue present left foot submetatarsal area as well as the right fifth toe. Upon debridement no underlying ulceration, drainage or signs of infection. No other open lesions or pre-ulcerative lesions are identified today.    Neruologic: Grossly intact via light touch bilateral. Vibratory intact via tuning fork bilateral. Protective threshold with Semmes Wienstein monofilament intact to all pedal sites bilateral.   Musculoskeletal: There is decreased but mild continued tenderness palpation along the plantar medial tubercle of the calcaneus at the insertion of the plantar fascia on the left foot. There is no pain along the course of the plantar fascia within the arch of the foot. Plantar fascia appears to be intact bilaterally. There is no pain with lateral compression of the calcaneus and there is no pain with vibratory sensation. There is no pain along the course or insertion of the Achilles tendon. There are no other areas of tenderness to bilateral lower extremities. No gross boney pedal deformities bilateral. No pain, crepitus, or limitation noted with foot and ankle range of motion bilateral. Muscular strength 5/5 in all groups tested bilateral. Adductovarus of the 5th toes.   Gait: Unassisted, Nonantalgic.   Assessment: Presents for follow-up evaluation for heel pain, likely plantar fasciitis; adductovarus  Plan: -Treatment options discussed  including all alternatives, risks, and complications -At this time his pain is resolving. Discussed with him shoe gear medications and possible custom inserts. He'll check with his insurance that'll cover the inserts before proceeding with custom. For now continue with icing exercises and I discussed with him to start the stretching exercises. I did show him today how to do the exercises. The plantar fascial brace as well.  -Hyperkeratotic lesions were debrided today without complications or bleeding.  -Follow-up if symptoms are not resolved in the next 4-6 weeks or sooner if any problems arise. In the meantime, encouraged to call the office with any questions, concerns, change in symptoms.   Celesta Gentile, DPM

## 2016-08-02 ENCOUNTER — Other Ambulatory Visit: Payer: Self-pay | Admitting: Internal Medicine

## 2016-08-07 ENCOUNTER — Encounter: Payer: Self-pay | Admitting: Internal Medicine

## 2016-08-07 ENCOUNTER — Ambulatory Visit (INDEPENDENT_AMBULATORY_CARE_PROVIDER_SITE_OTHER): Payer: BLUE CROSS/BLUE SHIELD | Admitting: Internal Medicine

## 2016-08-07 VITALS — BP 130/58 | HR 113 | Temp 97.9°F | Ht 66.0 in | Wt 156.6 lb

## 2016-08-07 DIAGNOSIS — K219 Gastro-esophageal reflux disease without esophagitis: Secondary | ICD-10-CM

## 2016-08-07 DIAGNOSIS — J111 Influenza due to unidentified influenza virus with other respiratory manifestations: Secondary | ICD-10-CM

## 2016-08-07 DIAGNOSIS — I10 Essential (primary) hypertension: Secondary | ICD-10-CM | POA: Diagnosis not present

## 2016-08-07 MED ORDER — HYDROCODONE-ACETAMINOPHEN 5-325 MG PO TABS: 1.0000 | ORAL_TABLET | Freq: Four times a day (QID) | ORAL | 0 refills | Status: DC | PRN

## 2016-08-07 NOTE — Progress Notes (Signed)
Subjective:    Patient ID: Edrick Cost, male    DOB: 10/11/1945, 71 y.o.   MRN: [id number]  HPI 71 year old patient who has a history of essential hypertension as well as reflux.  For the past few days.  The patient has had sore throat, nausea, chills and occasional fever.  Fever has been as high as 101.  He has had some epigastric discomfort as well as some constipation issues.  He has taken MiraLAX periodically with benefit.  No vomiting.  Past Medical History:  Diagnosis Date  . GERD (gastroesophageal reflux disease)   . Hypertension      Social History   Social History  . Marital status: Married    Spouse name: N/A  . Number of children: N/A  . Years of education: N/A   Occupational History  . [employer]   Social History Main Topics  . Smoking status: Never Smoker  . Smokeless tobacco: Never Used  . Alcohol use No  . Drug use: No  . Sexual activity: Not on file   Other Topics Concern  . Not on file   Social History Narrative  . No narrative on file    Past Surgical History:  Procedure Laterality Date  . EXTERNAL FIXATION LEG Right 09/05/2012   Procedure: Application of traction bow externally;  Surgeon: Mauri Pole, MD;  Location: [facility];  Service: Orthopedics;  Laterality: Right;  . ORIF ACETABULAR FRACTURE Right 09/08/2012   Procedure: OPEN REDUCTION INTERNAL FIXATION (ORIF) ACETABULAR FRACTURE;  Surgeon: Rozanna Box, MD;  Location: [place];  Service: Orthopedics;  Laterality: Right;  . TONSILLECTOMY      No family history on file.  No Known Allergies  Current Outpatient Prescriptions on File Prior to Visit  Medication Sig Dispense Refill  . lisinopril-hydrochlorothiazide (PRINZIDE,ZESTORETIC) 20-12.5 MG tablet TAKE 1 TABLET BY MOUTH  DAILY 90 tablet 0  . meloxicam (MOBIC) 15 MG tablet Take 1 tablet (15 mg total) by mouth daily. 30 tablet 2  . Multiple Vitamin (MULTIVITAMIN) tablet Take 1 tablet by mouth  daily.    . traMADol (ULTRAM) 50 MG tablet Take 1 tablet (50 mg total) by mouth every 8 (eight) hours as needed. 60 tablet 1   No current facility-administered medications on file prior to visit.     BP (!) 130/58 (BP Location: Right Arm, Patient Position: Sitting, Cuff Size: Normal)   Pulse (!) 113   Temp 97.9 F (36.6 C) (Oral)   Ht 5\' 6"  (1.676 m)   Wt 156 lb 9.6 oz (71 kg)   SpO2 98%   BMI 25.28 kg/m      Review of Systems  Constitutional: Positive for activity change, appetite change, chills, fatigue and fever.  HENT: Positive for sore throat. Negative for congestion, dental problem, ear pain, hearing loss, tinnitus, trouble swallowing and voice change.   Eyes: Negative for pain, discharge and visual disturbance.  Respiratory: Negative for cough, chest tightness, wheezing and stridor.   Cardiovascular: Negative for chest pain, palpitations and leg swelling.  Gastrointestinal: Positive for abdominal pain, constipation and nausea. Negative for abdominal distention, blood in stool, diarrhea and vomiting.  Genitourinary: Negative for difficulty urinating, discharge, flank pain, genital sores, hematuria and urgency.  Musculoskeletal: Negative for arthralgias, back pain, gait problem, joint swelling, myalgias and neck stiffness.  Skin: Negative for rash.  Neurological: Negative for dizziness, syncope, speech difficulty, weakness, numbness and headaches.  Hematological: Negative for adenopathy. Does not bruise/bleed easily.  Psychiatric/Behavioral: Negative for behavioral problems and dysphoric mood. The patient is not nervous/anxious.        Objective:   Physical Exam  Constitutional: He is oriented to person, place, and time. He appears well-developed.   Appears unwell, but in no acute distress Afebrile O2 saturation 98  Pulse rate on arrival 113  HENT:  Head: Normocephalic.  Right Ear: External ear normal.  Left Ear: External ear normal.  Only mild erythema without  exudate  Eyes: Conjunctivae and EOM are normal.  Neck: Normal range of motion. Neck supple.  Cardiovascular: Normal rate, regular rhythm and normal heart sounds.   Pulse rate decreased to 90  Pulmonary/Chest: Effort normal and breath sounds normal. No respiratory distress. He has no wheezes. He has no rales.  Abdominal: Soft. Bowel sounds are normal. He exhibits no distension. There is tenderness. There is no rebound and no guarding.  Suggestion of very mild epigastric tenderness  Musculoskeletal: Normal range of motion. He exhibits no edema or tenderness.  Lymphadenopathy:    He has no cervical adenopathy.  Neurological: He is alert and oriented to person, place, and time.  Psychiatric: He has a normal mood and affect. His behavior is normal.          Assessment & Plan:   Flu syndrome.  Patient has been ill for several days.  No benefit to antiviral therapy if flu documented.  Will treat symptomatically.  Will force fluids History of GERD.  Patient is having some mild nausea and some epigastric discomfort.  Samples of Nexium, dispensed short-term  Patient will report any new or worsening symptoms  Nyoka Cowden

## 2016-08-07 NOTE — Progress Notes (Signed)
Pre visit review using our clinic review tool, if applicable. No additional management support is needed unless otherwise documented below in the visit note. 

## 2016-08-07 NOTE — Patient Instructions (Addendum)
Drink as much fluid as you  can tolerate over the next few days  Nexium one daily   Call or return to clinic prn if these symptoms worsen or fail to improve as anticipated.  Take [phone] mg of Tylenol every 6 hours as needed for pain relief or fever.  Avoid taking more than 3000 mg in a 24-hour period (  This may cause liver damage).

## 2016-10-10 ENCOUNTER — Other Ambulatory Visit: Payer: Self-pay | Admitting: Internal Medicine

## 2016-11-12 ENCOUNTER — Ambulatory Visit (INDEPENDENT_AMBULATORY_CARE_PROVIDER_SITE_OTHER): Payer: BLUE CROSS/BLUE SHIELD | Admitting: Internal Medicine

## 2016-11-12 ENCOUNTER — Encounter: Payer: Self-pay | Admitting: Internal Medicine

## 2016-11-12 ENCOUNTER — Ambulatory Visit: Payer: Medicare Other | Admitting: Family Medicine

## 2016-11-12 VITALS — BP 146/78 | HR 79 | Temp 97.7°F | Ht 66.0 in | Wt 155.6 lb

## 2016-11-12 DIAGNOSIS — R1031 Right lower quadrant pain: Secondary | ICD-10-CM

## 2016-11-12 MED ORDER — MELOXICAM 15 MG PO TABS: 15.0000 mg | ORAL_TABLET | Freq: Every day | ORAL | 2 refills | Status: AC

## 2016-11-12 MED ORDER — TRAMADOL HCL 50 MG PO TABS: 50.0000 mg | ORAL_TABLET | Freq: Three times a day (TID) | ORAL | 1 refills | Status: DC | PRN

## 2016-11-12 NOTE — Progress Notes (Signed)
Subjective:    Patient ID: Leslie Cox, male    DOB: 10/25/1945, 71 y.o.   MRN: [id number]  HPI  71 year old patient who presents with a chief complaint of right groin pain.  This has been an intermittent problem since surgery for a right inguinal hernia in 2015.  The prior year he required surgery for a right acetabular fracture.  He feels he has minimal benefit with anti-inflammatory medications.  He has been on hydrocodone and tramadol intermittently. Pain has been worse over the past 4 days  Past Medical History:  Diagnosis Date  . GERD (gastroesophageal reflux disease)   . Hypertension      Social History   Social History  . Marital status: Married    Spouse name: N/A  . Number of children: N/A  . Years of education: N/A   Occupational History  . [employer]   Social History Main Topics  . Smoking status: Never Smoker  . Smokeless tobacco: Never Used  . Alcohol use No  . Drug use: No  . Sexual activity: Not on file   Other Topics Concern  . Not on file   Social History Narrative  . No narrative on file    Past Surgical History:  Procedure Laterality Date  . EXTERNAL FIXATION LEG Right 09/05/2012   Procedure: Application of traction bow externally;  Surgeon: Mauri Pole, MD;  Location: [facility];  Service: Orthopedics;  Laterality: Right;  . ORIF ACETABULAR FRACTURE Right 09/08/2012   Procedure: OPEN REDUCTION INTERNAL FIXATION (ORIF) ACETABULAR FRACTURE;  Surgeon: Rozanna Box, MD;  Location: [facility];  Service: Orthopedics;  Laterality: Right;  . TONSILLECTOMY      No family history on file.  No Known Allergies  Current Outpatient Prescriptions on File Prior to Visit  Medication Sig Dispense Refill  . HYDROcodone-acetaminophen (NORCO/VICODIN) 5-325 MG tablet Take 1 tablet by mouth every 6 (six) hours as needed for moderate pain. 60 tablet 0  . lisinopril-hydrochlorothiazide (PRINZIDE,ZESTORETIC) 20-12.5 MG tablet  TAKE 1 TABLET BY MOUTH  DAILY 90 tablet 2  . Multiple Vitamin (MULTIVITAMIN) tablet Take 1 tablet by mouth daily.     No current facility-administered medications on file prior to visit.     BP (!) 146/78 (BP Location: Left Arm, Patient Position: Sitting, Cuff Size: Normal)   Pulse 79   Temp 97.7 F (36.5 C) (Oral)   Ht 5\' 6"  (1.676 m)   Wt 155 lb 9.6 oz (70.6 kg)   SpO2 98%   BMI 25.11 kg/m     Review of Systems  Constitutional: Negative for appetite change, chills, fatigue and fever.  HENT: Negative for congestion, dental problem, ear pain, hearing loss, sore throat, tinnitus, trouble swallowing and voice change.   Eyes: Negative for pain, discharge and visual disturbance.  Respiratory: Negative for cough, chest tightness, wheezing and stridor.   Cardiovascular: Negative for chest pain, palpitations and leg swelling.  Gastrointestinal: Negative for abdominal distention, abdominal pain, blood in stool, constipation, diarrhea, nausea and vomiting.  Genitourinary: Negative for difficulty urinating, discharge, flank pain, genital sores, hematuria and urgency.  Musculoskeletal: Negative for arthralgias, back pain, gait problem, joint swelling, myalgias and neck stiffness.       Right groin pain  Skin: Negative for rash.  Neurological: Negative for dizziness, syncope, speech difficulty, weakness, numbness and headaches.  Hematological: Negative for adenopathy. Does not bruise/bleed easily.  Psychiatric/Behavioral: Negative for behavioral problems and dysphoric mood. The patient is not  nervous/anxious.        Objective:   Physical Exam  Constitutional: He appears well-developed and well-nourished. No distress.  Abdominal:  Abdomen is soft, flat without tenderness No obvious hernia Range of motion the right hip  intact and did not aggravate the pain          Assessment & Plan:   Acute on chronic right groin pain following repair of a right inguinal hernia in a patient with  a prior right acetabular fracture.  Clinical exam is unremarkable.  Will continue symptomatic treatment. Follow-up Gen. Surgery if unimproved  Nyoka Cowden

## 2016-11-12 NOTE — Patient Instructions (Addendum)
You  may move around, but avoid painful motions and activities.  Apply heat  to the sore area for 15 to 20 minutes 3 or 4 times daily for the next two to 3 days.  Call or return to clinic prn if these symptoms worsen or fail to improve as anticipated.  Follow-up with general surgery.  If unimprovedtell her 15.  Will avoid and will be reviewed.  He manages due to

## 2017-01-27 ENCOUNTER — Encounter: Payer: Self-pay | Admitting: Internal Medicine

## 2017-01-27 ENCOUNTER — Ambulatory Visit (INDEPENDENT_AMBULATORY_CARE_PROVIDER_SITE_OTHER): Payer: BLUE CROSS/BLUE SHIELD | Admitting: Internal Medicine

## 2017-01-27 VITALS — BP 118/72 | HR 79 | Temp 98.1°F | Ht 66.0 in | Wt 169.6 lb

## 2017-01-27 DIAGNOSIS — I10 Essential (primary) hypertension: Secondary | ICD-10-CM

## 2017-01-27 DIAGNOSIS — L559 Sunburn, unspecified: Secondary | ICD-10-CM

## 2017-01-27 NOTE — Progress Notes (Signed)
   Subjective:    Patient ID: Leslie Cox, male    DOB: [birth year], 71 y.o.   MRN: [id number]  HPI  71 year old patient who has essential hypertension.  He presents today with a chief complaint of a sunburn involving the dorsal aspects of both feet.  [name]  exposure was 6 days ago. Pain is very modest and improving.  He has been using topical moisturizing creams.  Past Medical History:  Diagnosis Date  . GERD (gastroesophageal reflux disease)   . Hypertension      Social History   Social History  . Marital status: Married    Spouse name: N/A  . Number of children: N/A  . Years of education: N/A   Occupational History  . [employer]   Social History Main Topics  . Smoking status: Never Smoker  . Smokeless tobacco: Never Used  . Alcohol use No  . Drug use: No  . Sexual activity: Not on file   Other Topics Concern  . Not on file   Social History Narrative  . No narrative on file    Past Surgical History:  Procedure Laterality Date  . EXTERNAL FIXATION LEG Right 09/05/2012   Procedure: Application of traction bow externally;  Surgeon: Mauri Pole, MD;  Location: [facility];  Service: Orthopedics;  Laterality: Right;  . ORIF ACETABULAR FRACTURE Right 09/08/2012   Procedure: OPEN REDUCTION INTERNAL FIXATION (ORIF) ACETABULAR FRACTURE;  Surgeon: Rozanna Box, MD;  Location: [facility];  Service: Orthopedics;  Laterality: Right;  . TONSILLECTOMY      No family history on file.  No Known Allergies  Current Outpatient Prescriptions on File Prior to Visit  Medication Sig Dispense Refill  . HYDROcodone-acetaminophen (NORCO/VICODIN) 5-325 MG tablet Take 1 tablet by mouth every 6 (six) hours as needed for moderate pain. 60 tablet 0  . lisinopril-hydrochlorothiazide (PRINZIDE,ZESTORETIC) 20-12.5 MG tablet TAKE 1 TABLET BY MOUTH  DAILY 90 tablet 2  . meloxicam (MOBIC) 15 MG tablet Take 1 tablet (15 mg total) by mouth daily. 30 tablet 2  .  Multiple Vitamin (MULTIVITAMIN) tablet Take 1 tablet by mouth daily.    . traMADol (ULTRAM) 50 MG tablet Take 1 tablet (50 mg total) by mouth every 8 (eight) hours as needed. 60 tablet 1   No current facility-administered medications on file prior to visit.     BP 118/72 (BP Location: Left Arm, Patient Position: Sitting, Cuff Size: Normal)   Pulse 79   Temp 98.1 F (36.7 C) (Oral)   Ht 5\' 6"  (1.676 m)   Wt 169 lb 9.6 oz (76.9 kg)   SpO2 98%   BMI 27.37 kg/m     Review of Systems  Skin: Positive for wound.       Objective:   Physical Exam  Constitutional: He appears well-developed and well-nourished. No distress.  Skin:  Erythema over the dorsal aspects of both feet.  No bulla formation.  He does have some scaling involving the left lateral ankle and foot area.  There is also a superficial traumatic abrasion over the dorsal aspect of the left foot            Assessment & Plan:   Resolving sunburns dorsal aspect of the feet.  Local wound care discussed Essential hypertension, stable  Annual exam as scheduled  Nyoka Cowden

## 2017-01-27 NOTE — Patient Instructions (Addendum)
Sunburn Sunburn is damage to the skin that is caused by overexposure to ultraviolet (UV) rays. Repeated sun exposure causes early skin aging, such as wrinkles and sun spots. It also increases the risk of skin cancer.  CAUSES Sunburn is caused by getting too much UV radiation from the sun. RISK FACTORS The following factors may make you more likely to develop this condition:  Having a family history of sensitivity to the sun.  Having certain diseases, such as lupus.  Taking certain medicines.  Using certain cosmetics.  Having light-colored skin (light complexion). SYMPTOMS Symptoms of this condition include:  Red or pink skin.  Soreness and swelling of the affected areas.  Pain.  Blisters.  Peeling skin. You may also have a headache, fever, or fatigue if the sunburn covers a large part of your body. DIAGNOSIS This condition is diagnosed with a medical history and physical exam. TREATMENT Treatment focuses on managing your symptoms. Treatment may include:  Medicines to reduce swelling.  Steroid medicines to help with inflammation and itching. These may be applied as creams or taken by mouth (orally).  Antibiotic cream or ointment to apply to any blisters that break open. HOME CARE INSTRUCTIONS Medicines  Take or apply over-the-counter and prescription medicines only as told by your health care provider.  If you were prescribed an antibiotic medicine, use it as told by your health care provider. Do not stop using the antibiotic even if your condition improves. General Instructions  Avoid further exposure to the sun. Protect sunburned skin by wearing clothing that covers the injured skin.  Do not put ice on your sunburn. This can cause further damage. Try taking a cool bath or applying a cool, wet cloth (cool compress) to your skin. This may help with pain.  Drink enough fluid to keep your urine clear or pale yellow.  Try applying aloe vera or a moisturizer that has  soy in it to your sunburn. This may help. Do not apply aloe vera or moisturizer with soy if your sunburn has blisters.  Do not break any blisters that you may have. PREVENTION  Try to avoid the sun between 10:00 a.m. and 2:00 p.m. when it is the strongest.  Apply sunscreen at least 15 minutes before exposure to the sun.  Apply a sunscreen with an SPF of 15 or higher. Consider using an SPF of 30 or higher if you will be exposed to the sun for prolonged periods of time. Use a sunscreen that protects against all of the sun's rays (broad-spectrum) and is water-resistant.  Reapply sunscreen: ? About every two hours during sun exposure. ? More often when sweating a lot while out in the sun. ? After getting wet from swimming or playing in water.  Wear long sleeves, a hat, and sunglasses that block UV light when you are outside.  Talk with your health care provider about medicines, herbs, and foods that can make you more sensitive to light. Avoid these, if possible.  Do not use tanning beds. SEEK MEDICAL CARE IF:  You have a fever.  Your symptoms do not improve with treatment.  Your pain is not controlled with medicine.  Your burn becomes more painful and swollen. SEEK IMMEDIATE MEDICAL CARE IF:  You start to vomit or have diarrhea.  You feel faint or you pass out.  You have a headache and you feel confused.  You develop severe blistering.  You have pus or fluid coming from the blisters. This information is not intended to replace   advice given to you by your health care provider. Make sure you discuss any questions you have with your health care provider. Document Released: 03/27/2005 Document Revised: 10/09/2015 Document Reviewed: 12/19/2014 Elsevier Interactive Patient Education  2017 Elsevier Inc.  

## 2017-02-24 ENCOUNTER — Ambulatory Visit (INDEPENDENT_AMBULATORY_CARE_PROVIDER_SITE_OTHER): Payer: BLUE CROSS/BLUE SHIELD | Admitting: Psychology

## 2017-02-24 DIAGNOSIS — F331 Major depressive disorder, recurrent, moderate: Secondary | ICD-10-CM | POA: Diagnosis not present

## 2017-02-24 DIAGNOSIS — F411 Generalized anxiety disorder: Secondary | ICD-10-CM

## 2017-03-28 DIAGNOSIS — H9113 Presbycusis, bilateral: Secondary | ICD-10-CM | POA: Diagnosis not present

## 2017-03-28 DIAGNOSIS — H906 Mixed conductive and sensorineural hearing loss, bilateral: Secondary | ICD-10-CM | POA: Diagnosis not present

## 2017-04-10 LAB — HM DIABETES EYE EXAM

## 2017-04-11 ENCOUNTER — Ambulatory Visit (INDEPENDENT_AMBULATORY_CARE_PROVIDER_SITE_OTHER): Payer: Medicare Other | Admitting: Psychology

## 2017-04-11 DIAGNOSIS — F331 Major depressive disorder, recurrent, moderate: Secondary | ICD-10-CM | POA: Diagnosis not present

## 2017-04-11 DIAGNOSIS — F411 Generalized anxiety disorder: Secondary | ICD-10-CM | POA: Diagnosis not present

## 2017-04-16 ENCOUNTER — Ambulatory Visit (INDEPENDENT_AMBULATORY_CARE_PROVIDER_SITE_OTHER): Payer: BLUE CROSS/BLUE SHIELD | Admitting: Internal Medicine

## 2017-04-16 ENCOUNTER — Encounter: Payer: Self-pay | Admitting: Internal Medicine

## 2017-04-16 VITALS — BP 110/60 | HR 82 | Temp 97.7°F | Ht 66.0 in | Wt 158.2 lb

## 2017-04-16 DIAGNOSIS — I10 Essential (primary) hypertension: Secondary | ICD-10-CM | POA: Diagnosis not present

## 2017-04-16 DIAGNOSIS — Z0000 Encounter for general adult medical examination without abnormal findings: Secondary | ICD-10-CM | POA: Diagnosis not present

## 2017-04-16 DIAGNOSIS — R7302 Impaired glucose tolerance (oral): Secondary | ICD-10-CM

## 2017-04-16 LAB — COMPREHENSIVE METABOLIC PANEL
ALT: 19 U/L (ref 0–53)
AST: 18 U/L (ref 0–37)
Albumin: 4.3 g/dL (ref 3.5–5.2)
Alkaline Phosphatase: 49 U/L (ref 39–117)
BUN: 20 mg/dL (ref 6–23)
CALCIUM: 9.6 mg/dL (ref 8.4–10.5)
CHLORIDE: 100 meq/L (ref 96–112)
CO2: 27 meq/L (ref 19–32)
Creatinine, Ser: 0.82 mg/dL (ref 0.40–1.50)
GFR: 99.06 mL/min (ref >60.00)
Glucose, Bld: 102 mg/dL — ABNORMAL HIGH (ref 70–99)
Potassium: 4.4 mEq/L (ref 3.5–5.1)
Sodium: 139 mEq/L (ref 135–145)
Total Bilirubin: 0.7 mg/dL (ref 0.2–1.2)
Total Protein: 6.6 g/dL (ref 6.0–8.3)

## 2017-04-16 LAB — CBC WITH DIFFERENTIAL/PLATELET
BASOS PCT: 0.5 % (ref 0.0–3.0)
Basophils Absolute: 0 10*3/uL (ref 0.0–0.1)
EOS ABS: 0.1 10*3/uL (ref 0.0–0.7)
EOS PCT: 2.2 % (ref 0.0–5.0)
HEMATOCRIT: 44.9 % (ref 39.0–52.0)
HEMOGLOBIN: 15 g/dL (ref 13.0–17.0)
LYMPHS PCT: 20 % (ref 12.0–46.0)
Lymphs Abs: 1.1 10*3/uL (ref 0.7–4.0)
MCHC: 33.5 g/dL (ref 30.0–36.0)
MCV: 92.1 fl (ref 78.0–100.0)
Monocytes Absolute: 0.5 10*3/uL (ref 0.1–1.0)
Monocytes Relative: 8.9 % (ref 3.0–12.0)
Neutro Abs: 3.9 10*3/uL (ref 1.4–7.7)
Neutrophils Relative %: 68.4 % (ref 43.0–77.0)
Platelets: 236 10*3/uL (ref 150.0–400.0)
RBC: 4.87 Mil/uL (ref 4.22–5.81)
RDW: 12.8 % (ref 11.5–15.5)
WBC: 5.7 10*3/uL (ref 4.0–10.5)

## 2017-04-16 LAB — LIPID PANEL
CHOL/HDL RATIO: 6
Cholesterol: 206 mg/dL — ABNORMAL HIGH (ref 0–200)
HDL: 36.5 mg/dL — AB (ref >39.00)
LDL CALC: 140 mg/dL — AB (ref 0–99)
NonHDL: 169.01
TRIGLYCERIDES: 144 mg/dL (ref 0.0–149.0)
VLDL: 28.8 mg/dL (ref 0.0–40.0)

## 2017-04-16 LAB — TSH: TSH: 1.71 u[IU]/mL (ref 0.35–4.50)

## 2017-04-16 LAB — HEMOGLOBIN A1C: Hgb A1c MFr Bld: 5.6 % (ref 4.6–6.5)

## 2017-04-16 NOTE — Progress Notes (Signed)
Subjective:    Patient ID: Leslie Cox, male    DOB: 12-15-1945, 71 y.o.   MRN: [id number]  HPI  71 year old patient who has a history of hypertension. He was seen by his eye doctor recently who had concerns about possible diabetes.  The patient has no prior history of diabetes nor family history of diabetes.  Apparently concern was due to a fairly rapid change in his vision and not diabetic retinopathy  .  The patient denies any hyperglycemic symptoms  Past Medical History:  Diagnosis Date  . GERD (gastroesophageal reflux disease)   . Hypertension      Social History   Social History  . Marital status: Married    Spouse name: N/A  . Number of children: N/A  . Years of education: N/A   Occupational History  . [employer]   Social History Main Topics  . Smoking status: Never Smoker  . Smokeless tobacco: Never Used  . Alcohol use No  . Drug use: No  . Sexual activity: Not on file   Other Topics Concern  . Not on file   Social History Narrative  . No narrative on file    Past Surgical History:  Procedure Laterality Date  . EXTERNAL FIXATION LEG Right 09/05/2012   Procedure: Application of traction bow externally;  Surgeon: Mauri Pole, MD;  Location: [facility];  Service: Orthopedics;  Laterality: Right;  . ORIF ACETABULAR FRACTURE Right 09/08/2012   Procedure: OPEN REDUCTION INTERNAL FIXATION (ORIF) ACETABULAR FRACTURE;  Surgeon: Rozanna Box, MD;  Location: [facility];  Service: Orthopedics;  Laterality: Right;  . TONSILLECTOMY      No family history on file.  No Known Allergies  Current Outpatient Prescriptions on File Prior to Visit  Medication Sig Dispense Refill  . HYDROcodone-acetaminophen (NORCO/VICODIN) 5-325 MG tablet Take 1 tablet by mouth every 6 (six) hours as needed for moderate pain. 60 tablet 0  . lisinopril-hydrochlorothiazide (PRINZIDE,ZESTORETIC) 20-12.5 MG tablet TAKE 1 TABLET BY MOUTH  DAILY 90 tablet 2    . meloxicam (MOBIC) 15 MG tablet Take 1 tablet (15 mg total) by mouth daily. 30 tablet 2  . Multiple Vitamin (MULTIVITAMIN) tablet Take 1 tablet by mouth daily.    . traMADol (ULTRAM) 50 MG tablet Take 1 tablet (50 mg total) by mouth every 8 (eight) hours as needed. 60 tablet 1   No current facility-administered medications on file prior to visit.     BP 110/60 (BP Location: Left Arm, Patient Position: Sitting, Cuff Size: Normal)   Pulse 82   Temp 97.7 F (36.5 C) (Oral)   Ht 5\' 6"  (1.676 m)   Wt 158 lb 3.2 oz (71.8 kg)   SpO2 98%   BMI 25.53 kg/m     Review of Systems  Constitutional: Negative for appetite change, chills, fatigue and fever.  HENT: Negative for congestion, dental problem, ear pain, hearing loss, sore throat, tinnitus, trouble swallowing and voice change.   Eyes: Negative for pain, discharge and visual disturbance.  Respiratory: Negative for cough, chest tightness, wheezing and stridor.   Cardiovascular: Negative for chest pain, palpitations and leg swelling.  Gastrointestinal: Negative for abdominal distention, abdominal pain, blood in stool, constipation, diarrhea, nausea and vomiting.  Genitourinary: Negative for difficulty urinating, discharge, flank pain, genital sores, hematuria and urgency.  Musculoskeletal: Negative for arthralgias, back pain, gait problem, joint swelling, myalgias and neck stiffness.  Skin: Negative for rash.  Neurological: Negative for dizziness, syncope,  speech difficulty, weakness, numbness and headaches.  Hematological: Negative for adenopathy. Does not bruise/bleed easily.  Psychiatric/Behavioral: Negative for behavioral problems and dysphoric mood. The patient is not nervous/anxious.        Objective:   Physical Exam  Constitutional: He is oriented to person, place, and time. He appears well-developed.  Blood pressure well controlled  HENT:  Head: Normocephalic.  Right Ear: External ear normal.  Left Ear: External ear normal.   Eyes: Conjunctivae and EOM are normal.  Neck: Normal range of motion.  Cardiovascular: Normal rate and normal heart sounds.   Pulmonary/Chest: Breath sounds normal.  Abdominal: Bowel sounds are normal.  Musculoskeletal: Normal range of motion. He exhibits no edema or tenderness.  Neurological: He is alert and oriented to person, place, and time.  Psychiatric: He has a normal mood and affect. His behavior is normal.          Assessment & Plan:   Essential hypertension Preventive health exam  We'll check updated lab Flu vaccine, declined We'll check a capillary blood sugar, hemoglobin A1c  Schedule CPX 6 months  [id number],PETER Pilar Plate

## 2017-04-16 NOTE — Patient Instructions (Signed)
Limit your sodium (Salt) intake    It is important that you exercise regularly, at least 20 minutes 3 to 4 times per week.  If you develop chest pain or shortness of breath seek  medical attention.  Please check your blood pressure on a regular basis.  If it is consistently greater than 150/90, please make an office appointment.  Return in 6 months for follow-up   

## 2017-04-24 IMAGING — CT CT PELVIS W/O CM
2 of 3 series · 16 of 46 positions shown, 18 images · non-contrast
Comparison: 09/06/2012

CLINICAL DATA: Right lower quadrant pain for 1 week. History of
inguinal hernia repair

EXAM:
CT PELVIS WITHOUT CONTRAST
TECHNIQUE: Multidetector CT imaging of the pelvis was performed following the
standard protocol without intravenous contrast.

[Series 2: routine pelvis w/(date) · axial · 0.67mm/px · z∈[+292,+522]mm · 13 of 54 slices shown, 15 images]
[im 4/54  soft-tissue]
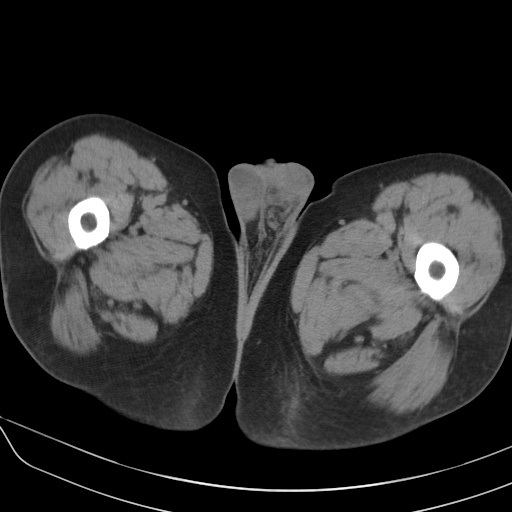
[im 4/54  bone]
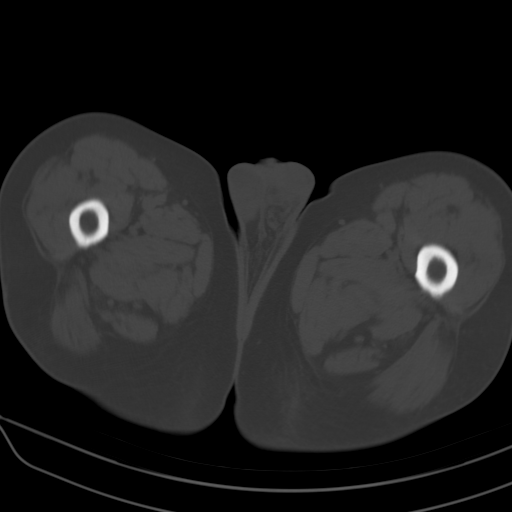
[im 7/54  soft-tissue]
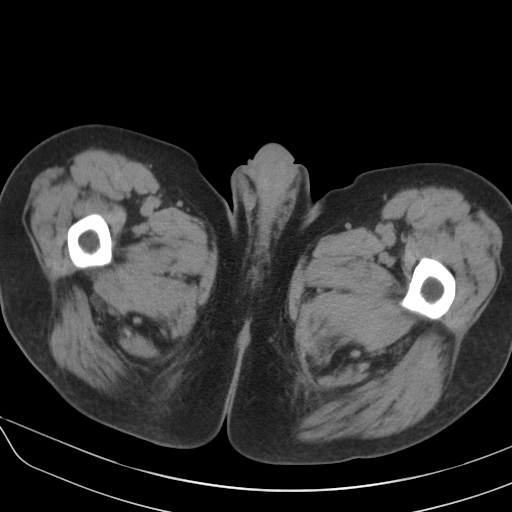
[im 11/54  soft-tissue]
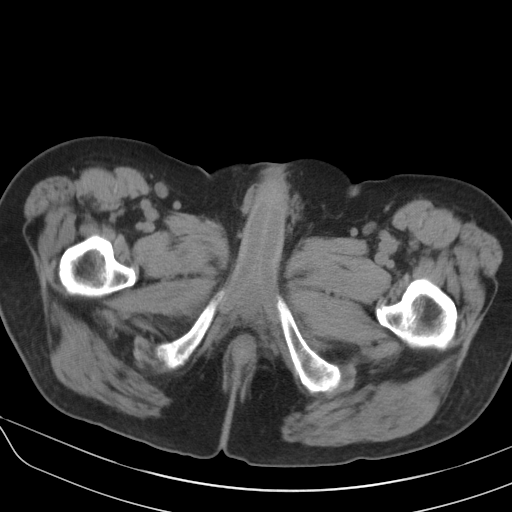
[im 16/54  soft-tissue]
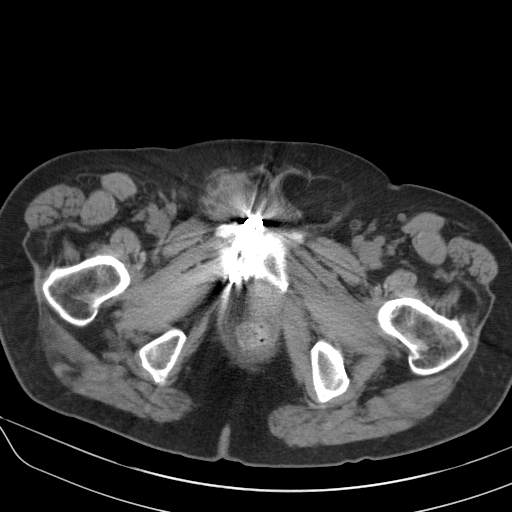
[im 19/54  soft-tissue]
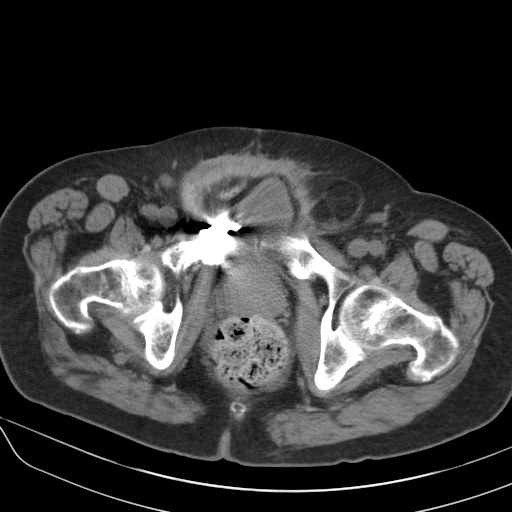
[im 23/54  soft-tissue]
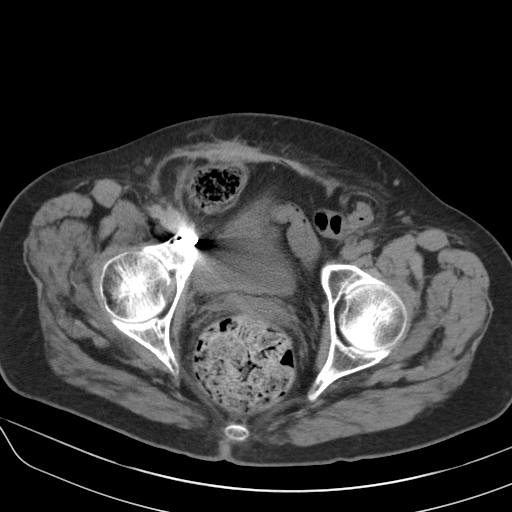
[im 28/54  soft-tissue]
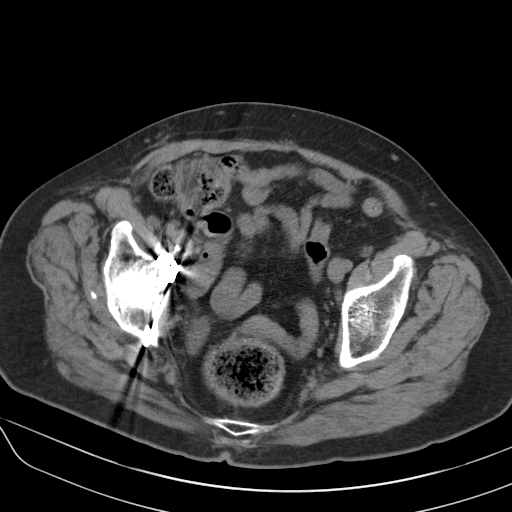
[im 31/54  soft-tissue]
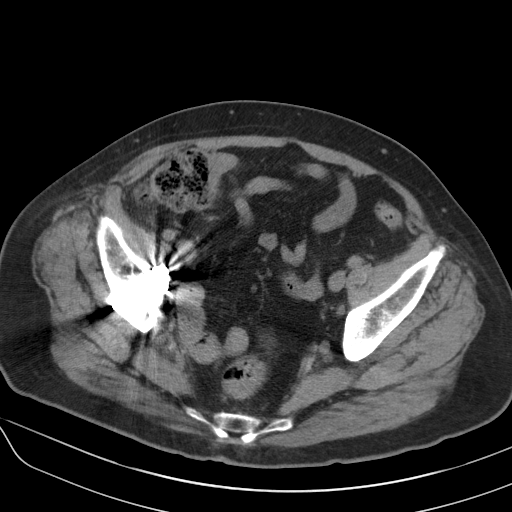
[im 35/54  soft-tissue]
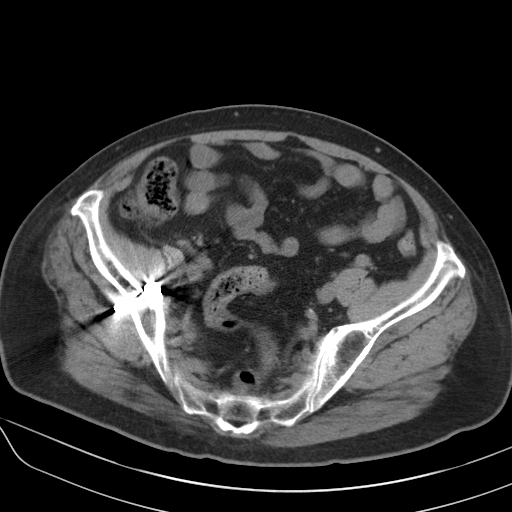
[im 35/54  bone]
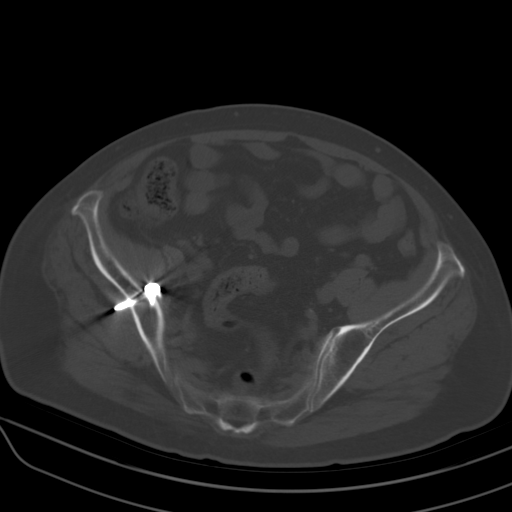
[im 38/54  soft-tissue]
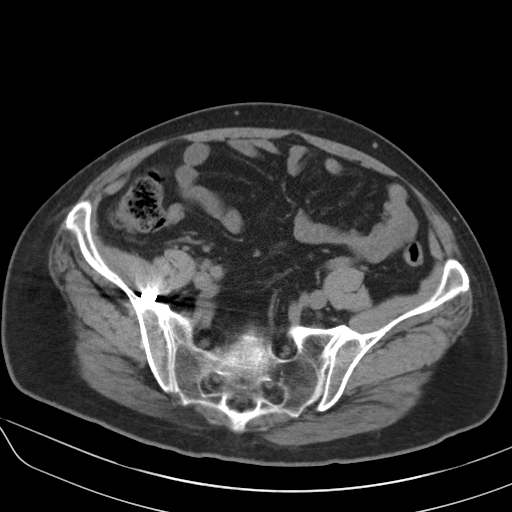
[im 43/54  soft-tissue]
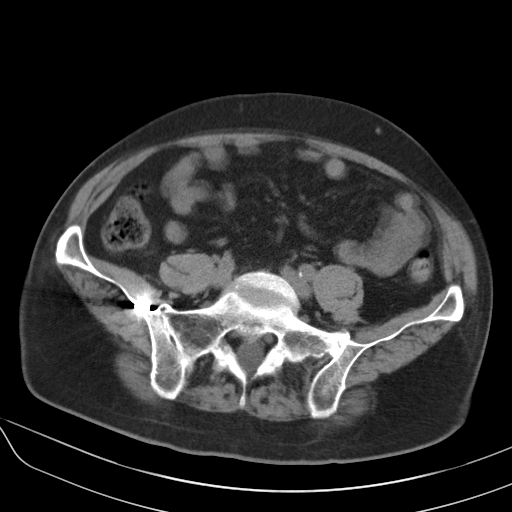
[im 47/54  soft-tissue]
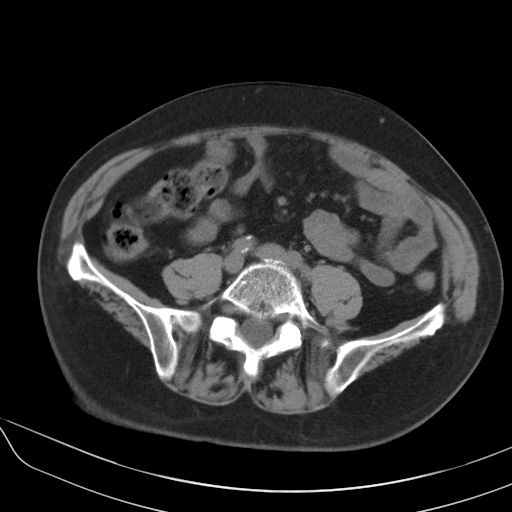
[im 50/54  soft-tissue]
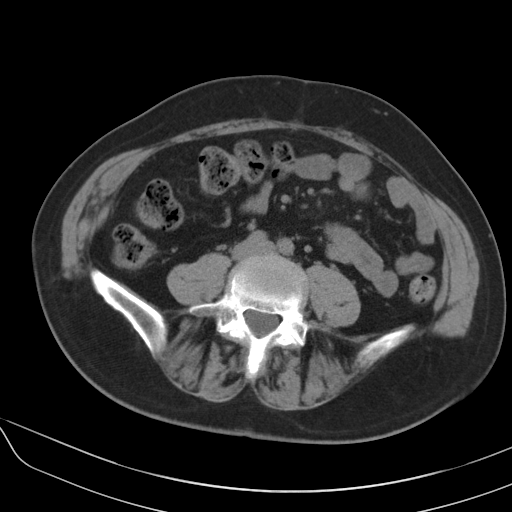

[Series 3: cor · coronal · 0.53mm/px · 3 of 123 slices shown]
[im 41/123  soft-tissue]
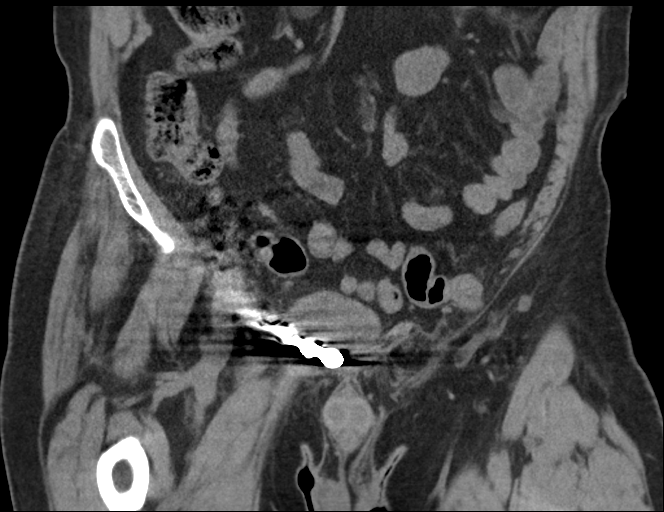
[im 55/123  soft-tissue]
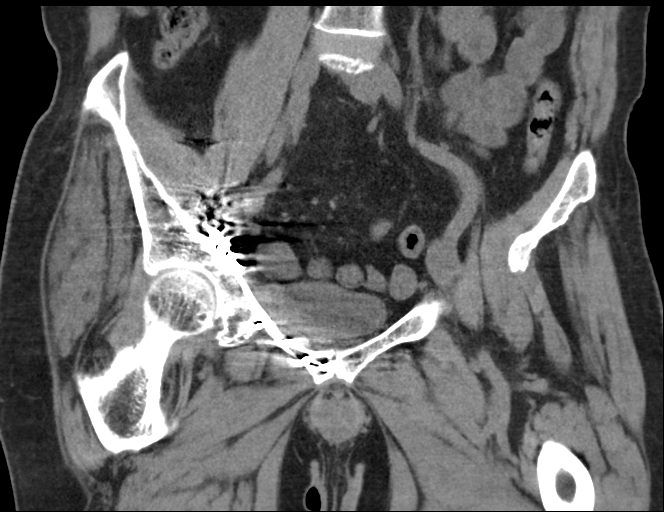
[im 68/123  soft-tissue]
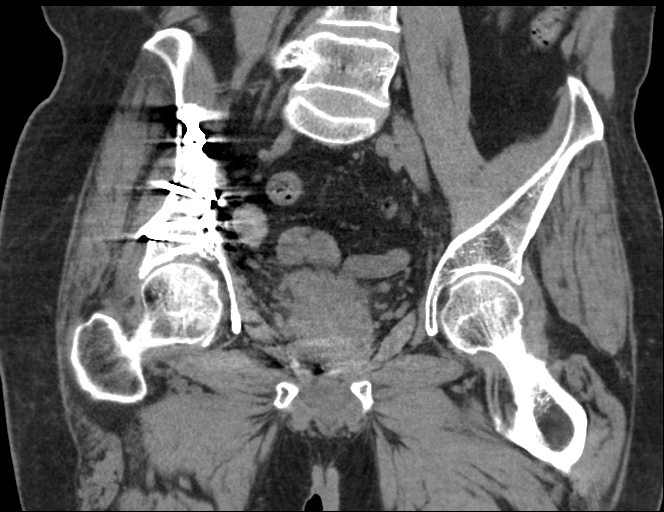

[16 of 46 positions shown; findings below may reference images not displayed]

FINDINGS: Scarring and myofascial thickening in the right lower quadrant
consistent with history of inguinal hernia repair. There is bulging
at the level of the mesh, but no recurrent herniation. Fatty left
inguinal hernia with wide neck. A small portion of descend colon is
present at the left deep inguinal ring.

Visualized bowel is negative for obstruction or inflammation. Normal
appendix.

Rectum is distended by desiccated stool to 7 cm. No definitive
impaction.

Remote right acetabular fracture status post fixation. No evidence
hardware complication. Bilateral sacroiliac ankylosis, degenerative
or posttraumatic.
IMPRESSION: 1. Right inguinal hernia repair. The mesh is bulging but no
recurrent hernia is noted.
2. Fatty left inguinal hernia with colon at the deep inguinal ring.
3. Right pelvic hardware for acetabular fracture fixation. No
adverse finding to explain symptoms.

## 2017-05-02 ENCOUNTER — Ambulatory Visit (INDEPENDENT_AMBULATORY_CARE_PROVIDER_SITE_OTHER): Payer: Medicare Other | Admitting: Psychology

## 2017-05-02 DIAGNOSIS — F331 Major depressive disorder, recurrent, moderate: Secondary | ICD-10-CM | POA: Diagnosis not present

## 2017-05-02 DIAGNOSIS — F411 Generalized anxiety disorder: Secondary | ICD-10-CM | POA: Diagnosis not present

## 2017-05-16 ENCOUNTER — Ambulatory Visit (INDEPENDENT_AMBULATORY_CARE_PROVIDER_SITE_OTHER): Payer: Medicare Other | Admitting: Psychology

## 2017-05-16 DIAGNOSIS — F331 Major depressive disorder, recurrent, moderate: Secondary | ICD-10-CM | POA: Diagnosis not present

## 2017-05-16 DIAGNOSIS — F411 Generalized anxiety disorder: Secondary | ICD-10-CM | POA: Diagnosis not present

## 2017-05-21 ENCOUNTER — Ambulatory Visit: Payer: Self-pay | Admitting: Family Medicine

## 2017-05-21 ENCOUNTER — Ambulatory Visit (INDEPENDENT_AMBULATORY_CARE_PROVIDER_SITE_OTHER): Payer: BLUE CROSS/BLUE SHIELD | Admitting: Adult Health

## 2017-05-21 ENCOUNTER — Encounter: Payer: Self-pay | Admitting: Adult Health

## 2017-05-21 VITALS — BP 120/64 | Temp 98.3°F | Wt 158.0 lb

## 2017-05-21 DIAGNOSIS — R1031 Right lower quadrant pain: Secondary | ICD-10-CM | POA: Diagnosis not present

## 2017-05-21 DIAGNOSIS — M545 Low back pain, unspecified: Secondary | ICD-10-CM

## 2017-05-21 NOTE — Progress Notes (Signed)
Subjective:    Patient ID: Leslie Cox, male    DOB: 10-13-1945, 71 y.o.   MRN: [id number]  HPI  71 year old male who  has a past medical history of GERD (gastroesophageal reflux disease) and Hypertension. He is a patient of Dr. Raliegh Ip, who I am seeing today in his absence. Leslie Cox is in the office today with the acute complaint of intermittent right buttock and right groin pain. He reports that the pain started about 5 days ago. He denies any trauma or falls. Pain is felt as though is is an " aching pain". He cannot state whether the pain is worse with movements such as bending or twisting.   He has a remote history of right inguinal hernia repair and right acetabular fracture ( after fall on ice), in 2015. He wants to make sure his pain is not from these surgeries   He denies any abdomina pain, discoloration, bruising, or n/v/d    Review of Systems See HPI   Past Medical History:  Diagnosis Date  . GERD (gastroesophageal reflux disease)   . Hypertension     Social History   Socioeconomic History  . Marital status: Married    Spouse name: Not on file  . Number of children: Not on file  . Years of education: Not on file  . Highest education level: Not on file  Social Needs  . Financial resource strain: Not on file  . Food insecurity - worry: Not on file  . Food insecurity - inability: Not on file  . Transportation needs - medical: Not on file  . Transportation needs - non-medical: Not on file  Occupational History  . Occupation: Surveyor, quantity: [employer]  . Occupation: Surveyor, quantity: [employer]  Tobacco Use  . Smoking status: Never Smoker  . Smokeless tobacco: Never Used  Substance and Sexual Activity  . Alcohol use: No  . Drug use: No  . Sexual activity: Not on file  Other Topics Concern  . Not on file  Social History Narrative  . Not on file    Past Surgical History:  Procedure Laterality Date  . EXTERNAL FIXATION LEG Right 09/05/2012   Procedure: Application of traction bow externally;  Surgeon: Mauri Pole, MD;  Location: [facility];  Service: Orthopedics;  Laterality: Right;  . ORIF ACETABULAR FRACTURE Right 09/08/2012   Procedure: OPEN REDUCTION INTERNAL FIXATION (ORIF) ACETABULAR FRACTURE;  Surgeon: Rozanna Box, MD;  Location: [facility];  Service: Orthopedics;  Laterality: Right;  . TONSILLECTOMY      No family history on file.  No Known Allergies  Current Outpatient Medications on File Prior to Visit  Medication Sig Dispense Refill  . HYDROcodone-acetaminophen (NORCO/VICODIN) 5-325 MG tablet Take 1 tablet by mouth every 6 (six) hours as needed for moderate pain. 60 tablet 0  . lisinopril-hydrochlorothiazide (PRINZIDE,ZESTORETIC) 20-12.5 MG tablet TAKE 1 TABLET BY MOUTH  DAILY 90 tablet 2  . meloxicam (MOBIC) 15 MG tablet Take 1 tablet (15 mg total) by mouth daily. 30 tablet 2  . Multiple Vitamin (MULTIVITAMIN) tablet Take 1 tablet by mouth daily.    . traMADol (ULTRAM) 50 MG tablet Take 1 tablet (50 mg total) by mouth every 8 (eight) hours as needed. 60 tablet 1   No current facility-administered medications on file prior to visit.     Temp 98.3 F (36.8 C) (Oral)   Wt 158 lb (71.7 kg)   BMI 25.50 kg/m  Objective:   Physical Exam  Constitutional: He is oriented to person, place, and time. He appears well-developed and well-nourished. No distress.  Cardiovascular: Normal rate, regular rhythm, normal heart sounds and intact distal pulses. Exam reveals no gallop.  No murmur heard. Pulmonary/Chest: Effort normal and breath sounds normal. No respiratory distress. He has no wheezes. He has no rales. He exhibits no tenderness.  Musculoskeletal: He exhibits tenderness.  Has mild tenderness with palpation to right buttock; this is apparent when bending at the waist. He has mild pain with palpation to mesh from right inguinal hernia repair but no apparent issues with mesh or surgical site  Has normal ROM.     Neurological: He is alert and oriented to person, place, and time.  Skin: Skin is warm and dry. No rash noted. He is not diaphoretic. No erythema. No pallor.  Psychiatric: He has a normal mood and affect. His behavior is normal. Judgment and thought content normal.  Nursing note and vitals reviewed.     Assessment & Plan:  Unsure of cause of the patient's pain at this time, but appears to be more muscular in nature.He has no bruising to right lower back or right groin. He does not have an new hernias. Mesh is intact from previous surgery. He did pull out a very thick wallet that he carries in his right hip pocket. I am going to have him carry his wallet out of his pocket and take motrin for the next 3-4 days to see if pain improves.  - Return precautions given   Dorothyann Peng, NP

## 2017-05-28 ENCOUNTER — Ambulatory Visit (INDEPENDENT_AMBULATORY_CARE_PROVIDER_SITE_OTHER): Payer: Medicare Other | Admitting: Psychology

## 2017-05-28 DIAGNOSIS — F411 Generalized anxiety disorder: Secondary | ICD-10-CM | POA: Diagnosis not present

## 2017-05-28 DIAGNOSIS — F331 Major depressive disorder, recurrent, moderate: Secondary | ICD-10-CM | POA: Diagnosis not present

## 2017-06-17 DIAGNOSIS — H25043 Posterior subcapsular polar age-related cataract, bilateral: Secondary | ICD-10-CM | POA: Diagnosis not present

## 2017-06-17 DIAGNOSIS — H25013 Cortical age-related cataract, bilateral: Secondary | ICD-10-CM | POA: Diagnosis not present

## 2017-06-17 DIAGNOSIS — I10 Essential (primary) hypertension: Secondary | ICD-10-CM | POA: Diagnosis not present

## 2017-06-17 DIAGNOSIS — H2512 Age-related nuclear cataract, left eye: Secondary | ICD-10-CM | POA: Diagnosis not present

## 2017-06-17 DIAGNOSIS — H2513 Age-related nuclear cataract, bilateral: Secondary | ICD-10-CM | POA: Diagnosis not present

## 2017-06-18 ENCOUNTER — Ambulatory Visit (INDEPENDENT_AMBULATORY_CARE_PROVIDER_SITE_OTHER): Payer: BLUE CROSS/BLUE SHIELD | Admitting: Psychology

## 2017-06-18 DIAGNOSIS — F411 Generalized anxiety disorder: Secondary | ICD-10-CM

## 2017-06-18 DIAGNOSIS — F331 Major depressive disorder, recurrent, moderate: Secondary | ICD-10-CM | POA: Diagnosis not present

## 2017-07-03 ENCOUNTER — Ambulatory Visit (INDEPENDENT_AMBULATORY_CARE_PROVIDER_SITE_OTHER): Payer: BLUE CROSS/BLUE SHIELD | Admitting: Psychology

## 2017-07-03 DIAGNOSIS — F331 Major depressive disorder, recurrent, moderate: Secondary | ICD-10-CM

## 2017-07-03 DIAGNOSIS — F411 Generalized anxiety disorder: Secondary | ICD-10-CM | POA: Diagnosis not present

## 2017-07-16 DIAGNOSIS — M26629 Arthralgia of temporomandibular joint, unspecified side: Secondary | ICD-10-CM | POA: Diagnosis not present

## 2017-07-16 DIAGNOSIS — H9113 Presbycusis, bilateral: Secondary | ICD-10-CM | POA: Diagnosis not present

## 2017-07-16 DIAGNOSIS — H9201 Otalgia, right ear: Secondary | ICD-10-CM | POA: Diagnosis not present

## 2017-07-21 DIAGNOSIS — H2512 Age-related nuclear cataract, left eye: Secondary | ICD-10-CM | POA: Diagnosis not present

## 2017-07-22 DIAGNOSIS — H2511 Age-related nuclear cataract, right eye: Secondary | ICD-10-CM | POA: Diagnosis not present

## 2017-07-31 ENCOUNTER — Ambulatory Visit (INDEPENDENT_AMBULATORY_CARE_PROVIDER_SITE_OTHER): Payer: BLUE CROSS/BLUE SHIELD | Admitting: Psychology

## 2017-07-31 DIAGNOSIS — F419 Anxiety disorder, unspecified: Secondary | ICD-10-CM

## 2017-07-31 DIAGNOSIS — R410 Disorientation, unspecified: Secondary | ICD-10-CM

## 2017-08-03 DIAGNOSIS — R52 Pain, unspecified: Secondary | ICD-10-CM | POA: Diagnosis not present

## 2017-08-06 ENCOUNTER — Ambulatory Visit (INDEPENDENT_AMBULATORY_CARE_PROVIDER_SITE_OTHER): Payer: BLUE CROSS/BLUE SHIELD | Admitting: Family Medicine

## 2017-08-06 ENCOUNTER — Encounter: Payer: Self-pay | Admitting: Family Medicine

## 2017-08-06 VITALS — BP 110/60 | HR 97 | Temp 98.2°F | Ht 66.0 in | Wt 151.0 lb

## 2017-08-06 DIAGNOSIS — J101 Influenza due to other identified influenza virus with other respiratory manifestations: Secondary | ICD-10-CM | POA: Diagnosis not present

## 2017-08-06 MED ORDER — ONDANSETRON 4 MG PO TBDP: 4.0000 mg | ORAL_TABLET | Freq: Three times a day (TID) | ORAL | 0 refills | Status: DC | PRN

## 2017-08-06 NOTE — Patient Instructions (Signed)

## 2017-08-06 NOTE — Progress Notes (Signed)
Subjective:     Patient ID: Leslie Cox, male   DOB: May 16, 1946, 72 y.o.   MRN: [id number]  HPI Patient seen for follow-up regarding recent URI type symptoms. Apparently last weekend developed some nasal congestion, body aches, cough, sore throat. He went to urgent care center on Sunday and brings in paperwork which showed that he was positive for influenza B. He has taken multiple over-the-counter medications including Delsym, DayQuil, NyQuil, Alka-Seltzer plus  He was placed on Tamiflu and apparently has only taken half prescription as his pharmacy did not have the full amount. He still needs to take the second half. Overall, feels some better. He does have some nausea without vomiting. No diarrhea. Still has some cough and body aches. Increased malaise. Requesting work note for today.  Past Medical History:  Diagnosis Date  . GERD (gastroesophageal reflux disease)   . Hypertension    Past Surgical History:  Procedure Laterality Date  . EXTERNAL FIXATION LEG Right 09/05/2012   Procedure: Application of traction bow externally;  Surgeon: Mauri Pole, MD;  Location: [facility];  Service: Orthopedics;  Laterality: Right;  . ORIF ACETABULAR FRACTURE Right 09/08/2012   Procedure: OPEN REDUCTION INTERNAL FIXATION (ORIF) ACETABULAR FRACTURE;  Surgeon: Rozanna Box, MD;  Location: [facility];  Service: Orthopedics;  Laterality: Right;  . TONSILLECTOMY      reports that  has never smoked. he has never used smokeless tobacco. He reports that he does not drink alcohol or use drugs. family history is not on file. No Known Allergies   Review of Systems  Constitutional: Positive for fatigue. Negative for chills and fever.  HENT: Positive for congestion.   Respiratory: Positive for cough.   Gastrointestinal: Positive for nausea. Negative for abdominal pain, diarrhea and vomiting.  Musculoskeletal: Positive for myalgias.  Skin: Negative for rash.       Objective:   Physical Exam  Constitutional: He  appears well-developed and well-nourished.  HENT:  Right Ear: External ear normal.  Left Ear: External ear normal.  Mouth/Throat: Oropharynx is clear and moist.  Neck: Neck supple.  Cardiovascular: Normal rate and regular rhythm.  Pulmonary/Chest: Effort normal and breath sounds normal. No respiratory distress. He has no wheezes. He has no rales.  Lymphadenopathy:    He has no cervical adenopathy.       Assessment:     Influenza B which was confirmed through testing at urgent care 3 days ago. Patient clinically appears to be stable and slowly improving    Plan:     -Prescription for Zofran 4 mg every 8 hours as needed for nausea -He is encouraged to get remainder of Tamiflu prescription filled and complete full course -Plenty of fluids and rest and follow-up for any recurrent fever or other concerns -Work note written for today and tomorrow  Eulas Post MD [facility]

## 2017-08-11 ENCOUNTER — Telehealth: Payer: Self-pay | Admitting: *Deleted

## 2017-08-11 DIAGNOSIS — H2511 Age-related nuclear cataract, right eye: Secondary | ICD-10-CM | POA: Diagnosis not present

## 2017-08-11 NOTE — Telephone Encounter (Signed)
Copied from [place]. Topic: Quick Communication - Rx Refill/Question >> Aug 11, 2017  3:10 PM Leslie Cox wrote: Reason for CRM: pt called and states that the visit he had last week his cough is not getting better and is asking if something can be called in for his cough, contact pt to advise

## 2017-08-12 ENCOUNTER — Other Ambulatory Visit: Payer: Self-pay | Admitting: Internal Medicine

## 2017-08-12 MED ORDER — HYDROCODONE-HOMATROPINE 5-1.5 MG/5ML PO SYRP: 5.0000 mL | ORAL_SOLUTION | Freq: Four times a day (QID) | ORAL | 0 refills | Status: DC | PRN

## 2017-08-12 NOTE — Telephone Encounter (Signed)
Okay for 6 ounces of hydrocodone-homatropine.  Prescription printed.  And available for pickup

## 2017-08-12 NOTE — Telephone Encounter (Signed)
Call placed to pt. to discuss his symptoms.  Reported his cough is worse; described as persistent and nonproductive.  Stated other symptoms of body aches, sore throat and nasal congestion are improving.  Denied fever. Stated he feels some chest congestion, but has not been coughing anything up.  Reported several medications he has tried OTC; reported has taken Dayquil, Emergence-C, Alka Seltzer Plus, and Mucinex.  Also stated has been compliant with recommendations for rest, increasing fluid intake, and completing course of Tamiflu.  Is asking if an Rx can be ordered for the persistent cough?  Advised will communicate his request with the MD.  Pt. Agrees with plan.

## 2017-08-12 NOTE — Telephone Encounter (Signed)
Patient called back, stated that he hasn't heard anything yet and that his cough is not improving.He would like for something else to be called in. Patient uses the [facility] in [place]. Please advise.

## 2017-08-12 NOTE — Telephone Encounter (Signed)
Pt notified that Rx is ready for pickup. Rx printed and signed.

## 2017-08-12 NOTE — Telephone Encounter (Signed)
Sir please see below messages. Please advise

## 2017-09-04 ENCOUNTER — Ambulatory Visit (INDEPENDENT_AMBULATORY_CARE_PROVIDER_SITE_OTHER): Payer: BLUE CROSS/BLUE SHIELD | Admitting: Psychology

## 2017-09-04 DIAGNOSIS — F601 Schizoid personality disorder: Secondary | ICD-10-CM

## 2017-09-04 DIAGNOSIS — F4322 Adjustment disorder with anxiety: Secondary | ICD-10-CM

## 2017-10-03 ENCOUNTER — Ambulatory Visit (INDEPENDENT_AMBULATORY_CARE_PROVIDER_SITE_OTHER): Payer: BLUE CROSS/BLUE SHIELD | Admitting: Psychology

## 2017-10-03 DIAGNOSIS — F4322 Adjustment disorder with anxiety: Secondary | ICD-10-CM | POA: Diagnosis not present

## 2017-10-03 DIAGNOSIS — F601 Schizoid personality disorder: Secondary | ICD-10-CM

## 2017-10-16 ENCOUNTER — Encounter: Payer: Self-pay | Admitting: Internal Medicine

## 2017-10-16 ENCOUNTER — Ambulatory Visit (INDEPENDENT_AMBULATORY_CARE_PROVIDER_SITE_OTHER): Payer: BLUE CROSS/BLUE SHIELD | Admitting: Internal Medicine

## 2017-10-16 VITALS — BP 118/60 | HR 74 | Temp 98.0°F | Ht 67.0 in | Wt 156.0 lb

## 2017-10-16 DIAGNOSIS — Z23 Encounter for immunization: Secondary | ICD-10-CM

## 2017-10-16 DIAGNOSIS — I10 Essential (primary) hypertension: Secondary | ICD-10-CM

## 2017-10-16 DIAGNOSIS — Z0000 Encounter for general adult medical examination without abnormal findings: Secondary | ICD-10-CM

## 2017-10-16 MED ORDER — LISINOPRIL-HYDROCHLOROTHIAZIDE 20-12.5 MG PO TABS: 1.0000 | ORAL_TABLET | Freq: Every day | ORAL | 2 refills | Status: DC

## 2017-10-16 NOTE — Progress Notes (Signed)
Subjective:    Patient ID: Leslie Cox, male    DOB: June 11, 1946, 72 y.o.   MRN: [id number]  HPI  72 year old patient who is seen today for a preventive health examination and subsequent Medicare wellness visit.  He has a history of essential hypertension.  No major concerns or complaints today  Status post right acetabular fracture March 2014   Preventive Screening-Counseling & Management  Alcohol-Tobacco  Smoking Status: never   Allergies (verified):  No Known Drug Allergies   Past History:  Past Medical History:  history of hypertension   Past Surgical History:  Tonsillectomy 1955  Colonoscopy 2005 cataract extraction surgery  Family History:  Reviewed history and no changes required.  father died in his 31s, probable coronary artery disease  mother 86s, resident of friends home  4 sisters are in good health   Social History:  Reviewed history and no changes required.  Married  Never Smoked  retail/one-year collegeSmoking Status: never Still works as a Production designer, theatre/television/film visit  1. Risk factors, based on past  M,S,F history.  Cardiovascular risk factors include hypertension only.  2.  Physical activities: Remains active.  Still works as a Systems analyst.  Depression/mood: No history of major depression or mood disorder  4.  Hearing:  Uses bilateral hearing aids 5.  ADL's: Independent  6.  Fall risk: Low  7.  Home safety: No problems identified  8.  Height weight, and visual acuity; height and weight stable no change in visual acuity has had bilateral cataract extraction surgery.  Wears glasses  9.  Counseling: Heart healthy diet more regular exercise encouraged  10. Lab orders based on risk factors: Laboratory studies from the fall of last year reviewed  60. Referral : GI referral for follow-up colonoscopy  12. Care plan: Continue efforts at aggressive risk factor modification  13. Cognitive assessment: Alert and appropriate normal  affect.  No cognitive dysfunction  14. Screening: Patient provided with a written and personalized 5-10 year screening schedule in the AVS.   15. Provider List Update: Primary care ophthalmology and GI   Review of Systems  Constitutional: Negative for appetite change, chills, fatigue and fever.  HENT: Negative for congestion, dental problem, ear pain, hearing loss, sore throat, tinnitus, trouble swallowing and voice change.   Eyes: Negative for pain, discharge and visual disturbance.  Respiratory: Negative for cough, chest tightness, wheezing and stridor.   Cardiovascular: Negative for chest pain, palpitations and leg swelling.  Gastrointestinal: Negative for abdominal distention, abdominal pain, blood in stool, constipation, diarrhea, nausea and vomiting.  Genitourinary: Negative for difficulty urinating, discharge, flank pain, genital sores, hematuria and urgency.  Musculoskeletal: Negative for arthralgias, back pain, gait problem, joint swelling, myalgias and neck stiffness.  Skin: Negative for rash.  Neurological: Negative for dizziness, syncope, speech difficulty, weakness, numbness and headaches.  Hematological: Negative for adenopathy. Does not bruise/bleed easily.  Psychiatric/Behavioral: Negative for behavioral problems and dysphoric mood. The patient is not nervous/anxious.        Objective:   Physical Exam  Constitutional: He appears well-developed and well-nourished.  HENT:  Head: Normocephalic and atraumatic.  Right Ear: External ear normal.  Left Ear: External ear normal.  Nose: Nose normal.  Mouth/Throat: Oropharynx is clear and moist.  Eyes: Pupils are equal, round, and reactive to light. Conjunctivae and EOM are normal. No scleral icterus.  Neck: Normal range of motion. Neck supple. No JVD present. No thyromegaly present.  Cardiovascular: Regular rhythm, normal heart sounds and intact distal  pulses. Exam reveals no gallop and no friction rub.  No murmur  heard. Pulmonary/Chest: Effort normal and breath sounds normal. He exhibits no tenderness.  Abdominal: Soft. Bowel sounds are normal. He exhibits no distension and no mass. There is no tenderness.  This surgical scars lower abdomen  Genitourinary: Prostate normal and penis normal. Rectal exam shows guaiac negative stool.  Musculoskeletal: Normal range of motion. He exhibits no edema or tenderness.  Lymphadenopathy:    He has no cervical adenopathy.  Neurological: He is alert. He has normal reflexes. No cranial nerve deficit. Coordination normal.  Skin: Skin is warm and dry. No rash noted.  Psychiatric: He has a normal mood and affect. His behavior is normal.          Assessment & Plan:   Preventive health examination.  Prevnar 13 dispensed.  We will set up for follow-up colonoscopy Subsequent Medicare wellness visit Essential hypertension stable  Continue home blood pressure monitoring Low-salt heart healthy diet  Nyoka Cowden

## 2017-10-16 NOTE — Addendum Note (Signed)
Addended by: Gwenyth Ober R on: 10/16/2017 10:06 AM   Modules accepted: Orders

## 2017-10-16 NOTE — Patient Instructions (Signed)
Limit your sodium (Salt) intake  Please check your blood pressure on a regular basis.  If it is consistently greater than 140/90, please make an office appointment.  Schedule your colonoscopy to help detect colon cancer.    It is important that you exercise regularly, at least 20 minutes 3 to 4 times per week.  If you develop chest pain or shortness of breath seek  medical attention.  Return in 6 months for follow-up

## 2017-10-17 ENCOUNTER — Other Ambulatory Visit: Payer: Self-pay | Admitting: Internal Medicine

## 2017-10-20 NOTE — Telephone Encounter (Signed)
Medication filled to pharmacy as requested.   

## 2017-10-21 ENCOUNTER — Encounter: Payer: Self-pay | Admitting: Gastroenterology

## 2017-11-07 DIAGNOSIS — F321 Major depressive disorder, single episode, moderate: Secondary | ICD-10-CM | POA: Diagnosis not present

## 2017-11-20 DIAGNOSIS — F321 Major depressive disorder, single episode, moderate: Secondary | ICD-10-CM | POA: Diagnosis not present

## 2017-12-17 DIAGNOSIS — M545 Low back pain: Secondary | ICD-10-CM | POA: Diagnosis not present

## 2017-12-19 DIAGNOSIS — M545 Low back pain: Secondary | ICD-10-CM | POA: Diagnosis not present

## 2017-12-22 ENCOUNTER — Ambulatory Visit (INDEPENDENT_AMBULATORY_CARE_PROVIDER_SITE_OTHER): Payer: BLUE CROSS/BLUE SHIELD | Admitting: Gastroenterology

## 2017-12-22 ENCOUNTER — Encounter: Payer: Self-pay | Admitting: Gastroenterology

## 2017-12-22 ENCOUNTER — Encounter: Payer: Self-pay | Admitting: Internal Medicine

## 2017-12-22 ENCOUNTER — Ambulatory Visit (INDEPENDENT_AMBULATORY_CARE_PROVIDER_SITE_OTHER): Payer: BLUE CROSS/BLUE SHIELD | Admitting: Internal Medicine

## 2017-12-22 ENCOUNTER — Encounter (INDEPENDENT_AMBULATORY_CARE_PROVIDER_SITE_OTHER): Payer: Self-pay

## 2017-12-22 VITALS — BP 118/74 | HR 84 | Ht 66.0 in | Wt 156.1 lb

## 2017-12-22 VITALS — BP 126/76 | Temp 98.4°F | Wt 155.0 lb

## 2017-12-22 DIAGNOSIS — Z1211 Encounter for screening for malignant neoplasm of colon: Secondary | ICD-10-CM

## 2017-12-22 DIAGNOSIS — Z1212 Encounter for screening for malignant neoplasm of rectum: Secondary | ICD-10-CM

## 2017-12-22 DIAGNOSIS — I10 Essential (primary) hypertension: Secondary | ICD-10-CM | POA: Diagnosis not present

## 2017-12-22 DIAGNOSIS — S32402K Unspecified fracture of left acetabulum, subsequent encounter for fracture with nonunion: Secondary | ICD-10-CM | POA: Diagnosis not present

## 2017-12-22 DIAGNOSIS — M545 Low back pain, unspecified: Secondary | ICD-10-CM

## 2017-12-22 MED ORDER — NA SULFATE-K SULFATE-MG SULF 17.5-3.13-1.6 GM/177ML PO SOLN: 1.0000 | Freq: Once | ORAL | 0 refills | Status: AC

## 2017-12-22 NOTE — Progress Notes (Signed)
History of Present Illness: This is a 72 year old male referred by Marletta Lor, MD for [facility] screening.  He states he had a colonoscopy done years ago but does not recall the date.  There is a [employer] encounter in [place] in 2005 with Dr. Howell Rucks however it contains no information. Denies weight loss, abdominal pain, constipation, diarrhea, change in stool caliber, melena, hematochezia, nausea, vomiting, dysphagia, reflux symptoms, chest pain.   No Known Allergies Outpatient Medications Prior to Visit  Medication Sig Dispense Refill  . lisinopril-hydrochlorothiazide (PRINZIDE,ZESTORETIC) 20-12.5 MG tablet TAKE 1 TABLET BY MOUTH  DAILY 90 tablet 2  . Multiple Vitamin (MULTIVITAMIN) tablet Take 1 tablet by mouth daily.    . traMADol (ULTRAM) 50 MG tablet Take 1 tablet (50 mg total) by mouth every 8 (eight) hours as needed. (Patient not taking: Reported on 12/22/2017) 60 tablet 1   No facility-administered medications prior to visit.    Past Medical History:  Diagnosis Date  . GERD (gastroesophageal reflux disease)   . Hypertension    Past Surgical History:  Procedure Laterality Date  . EXTERNAL FIXATION LEG Right 09/05/2012   Procedure: Application of traction bow externally;  Surgeon: Mauri Pole, MD;  Location: [facility];  Service: Orthopedics;  Laterality: Right;  . ORIF ACETABULAR FRACTURE Right 09/08/2012   Procedure: OPEN REDUCTION INTERNAL FIXATION (ORIF) ACETABULAR FRACTURE;  Surgeon: Rozanna Box, MD;  Location: [place];  Service: Orthopedics;  Laterality: Right;  . TONSILLECTOMY     Social History   Socioeconomic History  . Marital status: Married    Spouse name: Not on file  . Number of children: 1  . Years of education: Not on file  . Highest education level: Not on file  Occupational History  . Occupation: Surveyor, quantity: [employer]  . Occupation: Surveyor, quantity: [employer]  . Financial resource strain: Not  on file  . Food insecurity:    Worry: Not on file    Inability: Not on file  . Transportation needs:    Medical: Not on file    Non-medical: Not on file  Tobacco Use  . Smoking status: Never Smoker  . Smokeless tobacco: Never Used  Substance and Sexual Activity  . Alcohol use: No    Frequency: Never  . Drug use: No  . Sexual activity: Not on file  Lifestyle  . Physical activity:    Days per week: Not on file    Minutes per session: Not on file  . Stress: Not on file  Relationships  . Social connections:    Talks on phone: Not on file    Gets together: Not on file    Attends religious service: Not on file    Active member of club or organization: Not on file    Attends meetings of clubs or organizations: Not on file    Relationship status: Not on file  Other Topics Concern  . Not on file  Social History Narrative  . Not on file   Family History  Family history unknown: Yes      Review of Systems: Pertinent positive and negative review of systems were noted in the above HPI section. All other review of systems were otherwise negative.    Physical Exam: General: Well developed, well nourished, no acute distress Head: Normocephalic and atraumatic Eyes:  sclerae anicteric, EOMI Ears: Normal auditory acuity Mouth: No deformity or lesions Neck: Supple,  no masses or thyromegaly Lungs: Clear throughout to auscultation Heart: Regular rate and rhythm; no murmurs, rubs or bruits Abdomen: Soft, non tender and non distended. No masses, hepatosplenomegaly or hernias noted. Normal Bowel sounds Rectal:  Deferred to colonoscopy Musculoskeletal: Symmetrical with no gross deformities  Skin: No lesions on visible extremities Pulses:  Normal pulses noted Extremities: No clubbing, cyanosis, edema or deformities noted Neurological: Alert oriented x 4, grossly nonfocal Cervical Nodes:  No significant cervical adenopathy Inguinal Nodes: No significant inguinal  adenopathy Psychological:  Alert and cooperative. Normal mood and affect  Assessment and Recommendations:  1. CRC screening, average risk.  Last colonoscopy likely was in 2005 by Dr. Wynetta Emery. Will attempt to get records.  Schedule colonoscopy. The risks (including bleeding, perforation, infection, missed lesions, medication reactions and possible hospitalization or surgery if complications occur), benefits, and alternatives to colonoscopy with possible biopsy and possible polypectomy were discussed with the patient and they consent to proceed.     cc: Marletta Lor, MD [address] [place], [place] [address]

## 2017-12-22 NOTE — Patient Instructions (Signed)
You have been scheduled for a colonoscopy. Please follow written instructions given to you at your visit today.  Please pick up your prep supplies at the pharmacy within the next 1-3 days. If you use inhalers (even only as needed), please bring them with you on the day of your procedure. Your physician has requested that you go to www.startemmi.com and enter the access code given to you at your visit today. This web site gives a general overview about your procedure. However, you should still follow specific instructions given to you by our office regarding your preparation for the procedure.  Normal BMI (Body Mass Index- based on height and weight) is between 23 and 30. Your BMI today is Body mass index is 25.2 kg/m. [name] Please consider follow up  regarding your BMI with your Primary Care Provider.  Thank you for choosing me and [facility].  Pricilla Riffle. Dagoberto Ligas., MD., Marval Regal

## 2017-12-22 NOTE — Progress Notes (Signed)
Subjective:    Patient ID: Leslie Cox, male    DOB: 1945-10-16, 72 y.o.   MRN: [id number]  HPI  72 year old patient who has a history of essential hypertension.  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  For the past 8 days he has had right lumbar pain. He was evaluated by orthopedics twice last week and is scheduled for a lumbar MRI on Friday He continues to have pain with movement  Past Medical History:  Diagnosis Date  . GERD (gastroesophageal reflux disease)   . Hypertension      Social History   Socioeconomic History  . Marital status: Married    Spouse name: Not on file  . Number of children: 1  . Years of education: Not on file  . Highest education level: Not on file  Occupational History  . Occupation: Surveyor, quantity: [employer]  . Occupation: Surveyor, quantity: [employer]  . Financial resource strain: Not on file  . Food insecurity:    Worry: Not on file    Inability: Not on file  . Transportation needs:    Medical: Not on file    Non-medical: Not on file  Tobacco Use  . Smoking status: Never Smoker  . Smokeless tobacco: Never Used  Substance and Sexual Activity  . Alcohol use: No    Frequency: Never  . Drug use: No  . Sexual activity: Not on file  Lifestyle  . Physical activity:    Days per week: Not on file    Minutes per session: Not on file  . Stress: Not on file  Relationships  . Social connections:    Talks on phone: Not on file    Gets together: Not on file    Attends religious service: Not on file    Active member of club or organization: Not on file    Attends meetings of clubs or organizations: Not on file    Relationship status: Not on file  . Intimate partner violence:    Fear of current or ex partner: Not on file    Emotionally abused: Not on file    Physically abused: Not on file    Forced sexual activity: Not on file  Other Topics Concern  . Not  on file  Social History Narrative  . Not on file    Past Surgical History:  Procedure Laterality Date  . EXTERNAL FIXATION LEG Right 09/05/2012   Procedure: Application of traction bow externally;  Surgeon: Mauri Pole, MD;  Location: [place];  Service: Orthopedics;  Laterality: Right;  . ORIF ACETABULAR FRACTURE Right 09/08/2012   Procedure: OPEN REDUCTION INTERNAL FIXATION (ORIF) ACETABULAR FRACTURE;  Surgeon: Rozanna Box, MD;  Location: [facility];  Service: Orthopedics;  Laterality: Right;  . TONSILLECTOMY      Family History  Family history unknown: Yes    No Known Allergies  Current Outpatient Medications on File Prior to Visit  Medication Sig Dispense Refill  . lisinopril-hydrochlorothiazide (PRINZIDE,ZESTORETIC) 20-12.5 MG tablet TAKE 1 TABLET BY MOUTH  DAILY 90 tablet 2  . Multiple Vitamin (MULTIVITAMIN) tablet Take 1 tablet by mouth daily.    . Na Sulfate-K Sulfate-Mg Sulf 17.5-3.13-1.6 GM/177ML SOLN Take 1 kit by mouth once for 1 dose. 354 mL 0  . traMADol (ULTRAM) 50 MG tablet Take 1 tablet (50 mg total) by mouth every 8 (eight) hours as needed. [place]  tablet 1   No current facility-administered medications on file prior to visit.     BP 126/76 (BP Location: Right Arm, Patient Position: Sitting, Cuff Size: Normal)   Temp 98.4 F (36.9 C) (Oral)   Wt 155 lb (70.3 kg)   BMI 25.02 kg/m     Review of Systems  Constitutional: Negative for appetite change, chills, fatigue and fever.  HENT: Negative for congestion, dental problem, ear pain, hearing loss, sore throat, tinnitus, trouble swallowing and voice change.   Eyes: Negative for pain, discharge and visual disturbance.  Respiratory: Negative for cough, chest tightness, wheezing and stridor.   Cardiovascular: Negative for chest pain, palpitations and leg swelling.  Gastrointestinal: Negative for abdominal distention, abdominal pain, blood in stool, constipation, diarrhea, nausea and vomiting.  Genitourinary: Negative  for difficulty urinating, discharge, flank pain, genital sores, hematuria and urgency.  Musculoskeletal: Positive for back pain. Negative for arthralgias, gait problem, joint swelling, myalgias and neck stiffness.  Skin: Negative for rash.  Neurological: Negative for dizziness, syncope, speech difficulty, weakness, numbness and headaches.  Hematological: Negative for adenopathy. Does not bruise/bleed easily.  Psychiatric/Behavioral: Negative for behavioral problems and dysphoric mood. The patient is not nervous/anxious.        Objective:   Physical Exam  Constitutional: He appears well-developed and well-nourished.  Comfortable at rest but movement does tend to aggravate right lumbar pain  Musculoskeletal:  Difficult to lie supine due to the discomfort straight leg test does aggravate the right lumbar pain Muscle strength normal Reflexes intact          Assessment & Plan:   Acute right lumbar pain.  Will continue symptomatic treatment rest.  We will continue to moderate his activities.  He has seen orthopedics and is scheduled for a lumbar MRI later this week. Essential hypertension.  Blood pressure well controlled  Marletta Lor

## 2017-12-22 NOTE — Patient Instructions (Signed)
Most patients with low back pain will improve with time over the next two to 6 weeks.  Keep active but avoid any activities that cause pain.  Apply moist heat to the low back area several times daily.  Lumbar MRI as scheduled if unimproved

## 2017-12-24 ENCOUNTER — Telehealth: Payer: Self-pay | Admitting: Gastroenterology

## 2017-12-24 ENCOUNTER — Telehealth: Payer: Self-pay | Admitting: *Deleted

## 2017-12-24 NOTE — Telephone Encounter (Signed)
Patient called back regarding being placed on PCN for a dental procedure yesterday on 6/25. Patient isnt running a fever or having any other symptoms. Told the patient he was ok to come in for his colonoscopy tomorrow. Patient verbalized understanding. SM

## 2017-12-24 NOTE — Telephone Encounter (Signed)
Pt started taking peniciline yesterday and wants to know if he can still have his procedure scheduled for tomorrow with Dr. Fuller Plan.

## 2017-12-25 ENCOUNTER — Other Ambulatory Visit: Payer: Self-pay

## 2017-12-25 ENCOUNTER — Ambulatory Visit (AMBULATORY_SURGERY_CENTER): Payer: BLUE CROSS/BLUE SHIELD | Admitting: Gastroenterology

## 2017-12-25 ENCOUNTER — Encounter: Payer: Self-pay | Admitting: Gastroenterology

## 2017-12-25 VITALS — BP 150/83 | HR 66 | Temp 97.3°F | Resp 15 | Ht 66.0 in | Wt 156.0 lb

## 2017-12-25 DIAGNOSIS — Z1211 Encounter for screening for malignant neoplasm of colon: Secondary | ICD-10-CM | POA: Diagnosis not present

## 2017-12-25 DIAGNOSIS — D123 Benign neoplasm of transverse colon: Secondary | ICD-10-CM | POA: Diagnosis not present

## 2017-12-25 DIAGNOSIS — Z1212 Encounter for screening for malignant neoplasm of rectum: Secondary | ICD-10-CM | POA: Diagnosis not present

## 2017-12-25 MED ORDER — SODIUM CHLORIDE 0.9 % IV SOLN: 500.0000 mL | Freq: Once | INTRAVENOUS | Status: DC

## 2017-12-25 NOTE — Progress Notes (Signed)
Report given to PACU, vss 

## 2017-12-25 NOTE — Patient Instructions (Signed)
*   HANDOUT ON POLYPS GIVEN *   YOU HAD AN ENDOSCOPIC PROCEDURE TODAY AT [facility]:   Refer to the procedure report that was given to you for any specific questions about what was found during the examination.  If the procedure report does not answer your questions, please call your gastroenterologist to clarify.  If you requested that your care partner not be given the details of your procedure findings, then the procedure report has been included in a sealed envelope for you to review at your convenience later.  YOU SHOULD EXPECT: Some feelings of bloating in the abdomen. Passage of more gas than usual.  Walking can help get rid of the air that was put into your GI tract during the procedure and reduce the bloating. If you had a lower endoscopy (such as a colonoscopy or flexible sigmoidoscopy) you may notice spotting of blood in your stool or on the toilet paper. If you underwent a bowel prep for your procedure, you may not have a normal bowel movement for a few days.  Please Note:  You might notice some irritation and congestion in your nose or some drainage.  This is from the oxygen used during your procedure.  There is no need for concern and it should clear up in a day or so.  SYMPTOMS TO REPORT IMMEDIATELY:   Following lower endoscopy (colonoscopy or flexible sigmoidoscopy):  Excessive amounts of blood in the stool  Significant tenderness or worsening of abdominal pains  Swelling of the abdomen that is new, acute  Fever of 100F or higher   For urgent or emergent issues, a gastroenterologist can be reached at any hour by calling [phone].   DIET:  We do recommend a small meal at first, but then you may proceed to your regular diet.  Drink plenty of fluids but you should avoid alcoholic beverages for 24 hours.  ACTIVITY:  You should plan to take it easy for the rest of today and you should NOT DRIVE or use heavy machinery until tomorrow (because of the sedation  medicines used during the test).    FOLLOW UP: Our staff will call the number listed on your records the next business day following your procedure to check on you and address any questions or concerns that you may have regarding the information given to you following your procedure. If we do not reach you, we will leave a message.  However, if you are feeling well and you are not experiencing any problems, there is no need to return our call.  We will assume that you have returned to your regular daily activities without incident.  If any biopsies were taken you will be contacted by phone or by letter within the next 1-3 weeks.  Please call us at [phone] if you have not heard about the biopsies in 3 weeks.    SIGNATURES/CONFIDENTIALITY: You and/or your care partner have signed paperwork which will be entered into your electronic medical record.  These signatures attest to the fact that that the information above on your After Visit Summary has been reviewed and is understood.  Full responsibility of the confidentiality of this discharge information lies with you and/or your care-partner.

## 2017-12-25 NOTE — Op Note (Signed)
[place] Patient Name: Leslie Cox Procedure Date: 12/25/2017 9:12 AM MRN: [id number] Endoscopist: Ladene Artist , MD Age: 72 Referring MD:  Date of Birth: 1945-09-17 Gender: Male Account #: [id number] Procedure:                Colonoscopy Indications:              Screening for colorectal malignant neoplasm Medicines:                Monitored Anesthesia Care Procedure:                Pre-Anesthesia Assessment:                           - Prior to the procedure, a History and Physical                            was performed, and patient medications and                            allergies were reviewed. The patient's tolerance of                            previous anesthesia was also reviewed. The risks                            and benefits of the procedure and the sedation                            options and risks were discussed with the patient.                            All questions were answered, and informed consent                            was obtained. Prior Anticoagulants: The patient has                            taken no previous anticoagulant or antiplatelet                            agents. ASA Grade Assessment: II - A patient with                            mild systemic disease. After reviewing the risks                            and benefits, the patient was deemed in                            satisfactory condition to undergo the procedure.                           After obtaining informed consent, the colonoscope  was passed under direct vision. Throughout the                            procedure, the patient's blood pressure, pulse, and                            oxygen saturations were monitored continuously. The                            Colonoscope was introduced through the anus and                            advanced to the the cecum, identified by                            appendiceal orifice and  ileocecal valve. The                            ileocecal valve, appendiceal orifice, and rectum                            were photographed. The quality of the bowel                            preparation was adequate after extensive lavage and                            suctioning. The colonoscopy was performed without                            difficulty. The patient tolerated the procedure                            well. Scope In: 9:15:41 AM Scope Out: 9:34:26 AM Scope Withdrawal Time: 0 hours 13 minutes 51 seconds  Total Procedure Duration: 0 hours 18 minutes 45 seconds  Findings:                 The perianal and digital rectal examinations were                            normal.                           A 4 mm polyp was found in the transverse colon. The                            polyp was sessile. The polyp was removed with a                            cold biopsy forceps. Resection and retrieval were                            complete.  A few small-mouthed diverticula were found in the                            left colon. There was no evidence of diverticular                            bleeding.                           Internal hemorrhoids were found during                            retroflexion. The hemorrhoids were small and Grade                            I (internal hemorrhoids that do not prolapse).                           The exam was otherwise without abnormality on                            direct and retroflexion views. Complications:            No immediate complications. Estimated blood loss:                            None. Estimated Blood Loss:     Estimated blood loss: none. Impression:               - One 4 mm polyp in the transverse colon, removed                            with a cold biopsy forceps. Resected and retrieved.                           - Mild left colon diverticulosis                           - Internal  hemorrhoids.                           - The examination was otherwise normal on direct                            and retroflexion views. Recommendation:           - Repeat colonoscopy in 5 years for surveillance if                            polyp is precancerous, otherwise 10 years with a                            more extensive bowel prep.                           - Patient has a contact number available for  emergencies. The signs and symptoms of potential                            delayed complications were discussed with the                            patient. Return to normal activities tomorrow.                            Written discharge instructions were provided to the                            patient.                           - High fiber previous diet.                           - Continue present medications.                           - Await pathology results. Ladene Artist, MD 12/25/2017 9:38:45 AM This report has been signed electronically.

## 2017-12-25 NOTE — Progress Notes (Signed)
Called to room to assist during endoscopic procedure.  Patient ID and intended procedure confirmed with present staff. Received instructions for my participation in the procedure from the performing physician.  

## 2017-12-25 NOTE — Progress Notes (Signed)
Pt. Reports having a tooth extraction LUQ.

## 2017-12-26 ENCOUNTER — Telehealth: Payer: Self-pay

## 2017-12-26 DIAGNOSIS — M545 Low back pain: Secondary | ICD-10-CM | POA: Diagnosis not present

## 2017-12-26 NOTE — Telephone Encounter (Signed)
  Follow up Call-  Call Vonzella Althaus number 12/25/2017  Post procedure Call Leelyn Jasinski phone  # [phone] - Ple[place] Koch  Permission to leave phone message Yes  Some recent data might be hidden     Patient questions:  Do you have a fever, pain , or abdominal swelling? No. Pain Score  0 *  Have you tolerated food without any problems? Yes.    Have you been able to return to your normal activities? Yes.    Do you have any questions about your discharge instructions: Diet   No. Medications  No. Follow up visit  No.  Do you have questions or concerns about your Care? No.  Actions: * If pain score is 4 or above: No action needed, pain <4.

## 2018-01-03 DIAGNOSIS — M5136 Other intervertebral disc degeneration, lumbar region: Secondary | ICD-10-CM | POA: Diagnosis not present

## 2018-01-04 ENCOUNTER — Encounter: Payer: Self-pay | Admitting: Gastroenterology

## 2018-02-27 ENCOUNTER — Ambulatory Visit (INDEPENDENT_AMBULATORY_CARE_PROVIDER_SITE_OTHER): Payer: BLUE CROSS/BLUE SHIELD | Admitting: Adult Health

## 2018-02-27 ENCOUNTER — Encounter: Payer: Self-pay | Admitting: Adult Health

## 2018-02-27 VITALS — BP 106/64 | Temp 98.2°F | Wt 156.0 lb

## 2018-02-27 DIAGNOSIS — R1031 Right lower quadrant pain: Secondary | ICD-10-CM | POA: Diagnosis not present

## 2018-02-27 MED ORDER — IBUPROFEN 600 MG PO TABS: 600.0000 mg | ORAL_TABLET | Freq: Three times a day (TID) | ORAL | 0 refills | Status: DC | PRN

## 2018-02-27 NOTE — Progress Notes (Signed)
Subjective:    Patient ID: Leslie Cox, male    DOB: 03-Apr-1946, 72 y.o.   MRN: [id number]  HPI  72 year old male who  has a past medical history of GERD (gastroesophageal reflux disease) and Hypertension. He presents to the office today for the acute complaint of right groin pain.  Reports that pain started 2 to 3 days ago.  Denies any aggravating factors or trauma that could have caused the discomfort.  Pain is intermittent.  He is not able to describe the pain.  Does report inguinal surgery multiple years ago.  Ports having bowel movements and passing gas.  No to UTI-like symptoms noted.  Pain is worse with palpation  Review of Systems  See HPI   Past Medical History:  Diagnosis Date  . GERD (gastroesophageal reflux disease)   . Hypertension     Social History   Socioeconomic History  . Marital status: Married    Spouse name: Not on file  . Number of children: 1  . Years of education: Not on file  . Highest education level: Not on file  Occupational History  . Occupation: Surveyor, quantity: [employer]  . Occupation: Surveyor, quantity: [employer]  . Financial resource strain: Not on file  . Food insecurity:    Worry: Not on file    Inability: Not on file  . Transportation needs:    Medical: Not on file    Non-medical: Not on file  Tobacco Use  . Smoking status: Never Smoker  . Smokeless tobacco: Never Used  Substance and Sexual Activity  . Alcohol use: No    Frequency: Never  . Drug use: No  . Sexual activity: Not on file  Lifestyle  . Physical activity:    Days per week: Not on file    Minutes per session: Not on file  . Stress: Not on file  Relationships  . Social connections:    Talks on phone: Not on file    Gets together: Not on file    Attends religious service: Not on file    Active member of club or organization: Not on file    Attends meetings of clubs or organizations: Not on file    Relationship status: Not on  file  . Intimate partner violence:    Fear of current or ex partner: Not on file    Emotionally abused: Not on file    Physically abused: Not on file    Forced sexual activity: Not on file  Other Topics Concern  . Not on file  Social History Narrative  . Not on file    Past Surgical History:  Procedure Laterality Date  . EXTERNAL FIXATION LEG Right 09/05/2012   Procedure: Application of traction bow externally;  Surgeon: Mauri Pole, MD;  Location: [facility];  Service: Orthopedics;  Laterality: Right;  . ORIF ACETABULAR FRACTURE Right 09/08/2012   Procedure: OPEN REDUCTION INTERNAL FIXATION (ORIF) ACETABULAR FRACTURE;  Surgeon: Rozanna Box, MD;  Location: [facility];  Service: Orthopedics;  Laterality: Right;  . TONSILLECTOMY      Family History  Family history unknown: Yes    No Known Allergies  Current Outpatient Medications on File Prior to Visit  Medication Sig Dispense Refill  . lisinopril-hydrochlorothiazide (PRINZIDE,ZESTORETIC) 20-12.5 MG tablet TAKE 1 TABLET BY MOUTH  DAILY 90 tablet 2   Current Facility-Administered Medications on File Prior to Visit  Medication Dose Route Frequency Provider  Last Rate Last Dose  . 0.9 %  sodium chloride infusion  500 mL Intravenous Once Lucio Edward T, MD        BP 106/64   Temp 98.2 F (36.8 C)   Wt 156 lb (70.8 kg)   BMI 25.18 kg/m       Objective:   Physical Exam  Constitutional: He is oriented to person, place, and time. He appears well-developed and well-nourished.  Non-toxic appearance. He does not appear ill.  Cardiovascular: Normal rate, regular rhythm, normal heart sounds and intact distal pulses.  Pulmonary/Chest: Effort normal and breath sounds normal.  Abdominal: Soft. Normal appearance, normal aorta and bowel sounds are normal. He exhibits no distension and no mass. There is no hepatosplenomegaly, splenomegaly or hepatomegaly. There is tenderness in the suprapubic area. There is no rigidity, no rebound, no  guarding, no tenderness at McBurney's point and negative Murphy's sign. No hernia.  Trickle scar from previous inguinal hernia repair.  Pain is located over this area.  Reports pain with palpation but does not appear to be in pain  Neurological: He is alert and oriented to person, place, and time.  Skin: Skin is warm and dry.  Psychiatric: He has a normal mood and affect. His behavior is normal. Judgment and thought content normal.  Nursing note and vitals reviewed.     Assessment & Plan:  1. Right groin pain -No hernia noted no abnormality present.  Advised patient that he may have strained this area.  Will send in ibuprofen 600 mg and was advised to use a heating pad.  Return precautions reviewed  Dorothyann Peng, NP

## 2018-02-27 NOTE — Patient Instructions (Signed)
I think you may have pulled or strained muscle.   Please use a heating pad and take the prescription strength motrin I have sent to the pharmacy   Follow up if no improvement over the weekend

## 2018-03-06 DIAGNOSIS — F321 Major depressive disorder, single episode, moderate: Secondary | ICD-10-CM | POA: Diagnosis not present

## 2018-06-03 DIAGNOSIS — R69 Illness, unspecified: Secondary | ICD-10-CM | POA: Diagnosis not present

## 2018-06-08 DIAGNOSIS — R69 Illness, unspecified: Secondary | ICD-10-CM | POA: Diagnosis not present

## 2018-07-09 ENCOUNTER — Ambulatory Visit (INDEPENDENT_AMBULATORY_CARE_PROVIDER_SITE_OTHER): Payer: Medicare HMO

## 2018-07-09 ENCOUNTER — Ambulatory Visit (INDEPENDENT_AMBULATORY_CARE_PROVIDER_SITE_OTHER): Payer: Medicare HMO | Admitting: Family Medicine

## 2018-07-09 ENCOUNTER — Encounter: Payer: Self-pay | Admitting: Family Medicine

## 2018-07-09 VITALS — BP 122/70 | HR 78 | Temp 97.6°F | Ht 66.0 in | Wt 156.6 lb

## 2018-07-09 DIAGNOSIS — G8929 Other chronic pain: Secondary | ICD-10-CM

## 2018-07-09 DIAGNOSIS — M25512 Pain in left shoulder: Secondary | ICD-10-CM

## 2018-07-09 DIAGNOSIS — I10 Essential (primary) hypertension: Secondary | ICD-10-CM

## 2018-07-09 DIAGNOSIS — M25511 Pain in right shoulder: Secondary | ICD-10-CM

## 2018-07-09 NOTE — Assessment & Plan Note (Addendum)
At goal.  Continue lisinopril-HCTZ 20-12.5 daily.

## 2018-07-09 NOTE — Progress Notes (Signed)
Subjective:  Leslie Cox is a 73 y.o. male who presents today with a chief complaint of shoulder pain and to transfer care to this office.  HPI:  Shoulder Pain, new problem Symptoms started 2 or 3 months ago.  Located in bilateral shoulders, however is worse in the left.  No clear precipitating events however does work at [employer] and reaches a lot with his hands.  Symptoms seem to have worsened over last several weeks.  He has tried taking Aleve which has not helped.  He is also tried CBD oil which has not helped either.  No weakness or numbness.  No other treatments tried.  Symptoms are worse with certain movements such as reaching above his head.  No other obvious alleviating or aggravating factors.  Hypertension On lisinopril-HCTZ 20-12.5 mg tablet once daily.  Tolerating well.  ROS: Per HPI  PMH:  The following were reviewed and entered/updated in epic: Past Medical History:  Diagnosis Date  . GERD (gastroesophageal reflux disease)   . Hypertension   . PARESTHESIA 03/16/2010   Qualifier: Diagnosis of  By: Burnice Logan  MD, Doretha Sou   . Right acetabular fracture ([place]) 09/15/2012  . Unspecified vitamin D deficiency 09/14/2012   Patient Active Problem List   Diagnosis Date Noted  . Right inguinal hernia 02/23/2014  . Hypertension 10/02/2012  . Acid reflux 09/07/2012   Past Surgical History:  Procedure Laterality Date  . EXTERNAL FIXATION LEG Right 09/05/2012   Procedure: Application of traction bow externally;  Surgeon: Mauri Pole, MD;  Location: [facility];  Service: Orthopedics;  Laterality: Right;  . ORIF ACETABULAR FRACTURE Right 09/08/2012   Procedure: OPEN REDUCTION INTERNAL FIXATION (ORIF) ACETABULAR FRACTURE;  Surgeon: Rozanna Box, MD;  Location: [facility];  Service: Orthopedics;  Laterality: Right;  . TONSILLECTOMY      Family History  Problem Relation Age of Onset  . Cancer Neg Hx     Medications- reviewed and updated Current Outpatient Medications  Medication  Sig Dispense Refill  . ibuprofen (ADVIL,MOTRIN) 600 MG tablet Take 1 tablet (600 mg total) by mouth every 8 (eight) hours as needed. 30 tablet 0  . lisinopril-hydrochlorothiazide (PRINZIDE,ZESTORETIC) 20-12.5 MG tablet TAKE 1 TABLET BY MOUTH  DAILY 90 tablet 2   No current facility-administered medications for this visit.     Allergies-reviewed and updated No Known Allergies  Social History   Socioeconomic History  . Marital status: Married    Spouse name: Not on file  . Number of children: 1  . Years of education: Not on file  . Highest education level: Not on file  Occupational History  . Occupation: Surveyor, quantity: [employer]  . Occupation: Surveyor, quantity: [employer]  . Financial resource strain: Not on file  . Food insecurity:    Worry: Not on file    Inability: Not on file  . Transportation needs:    Medical: Not on file    Non-medical: Not on file  Tobacco Use  . Smoking status: Never Smoker  . Smokeless tobacco: Never Used  Substance and Sexual Activity  . Alcohol use: No    Frequency: Never  . Drug use: No  . Sexual activity: Not on file  Lifestyle  . Physical activity:    Days per week: Not on file    Minutes per session: Not on file  . Stress: Not on file  Relationships  . Social connections:    Talks  on phone: Not on file    Gets together: Not on file    Attends religious service: Not on file    Active member of club or organization: Not on file    Attends meetings of clubs or organizations: Not on file    Relationship status: Not on file  Other Topics Concern  . Not on file  Social History Narrative  . Not on file     Objective:  Physical Exam: BP 122/70 (BP Location: Left Arm, Patient Position: Sitting, Cuff Size: Normal)   Pulse 78   Temp 97.6 F (36.4 C) (Oral)   Ht 5\' 6"  (1.676 m)   Wt 156 lb 9.6 oz (71 kg)   SpO2 97%   BMI 25.28 kg/m   Gen: NAD, resting comfortably CV: RRR with no murmurs  appreciated Pulm: NWOB, CTAB with no crackles, wheezes, or rhonchi GI: Normal bowel sounds present. Soft, Nontender, Nondistended. MSK: -Left shoulder: No deformities.  Tender to palpation throughout anterior shoulder.  Normal range of motion.  Normal strength with supraspinatus testing and internal/external rotation, however these maneuvers elicit pain.  Neurovascular intact distally -Right shoulder: No deformities.  Tender to palpation throughout anterior shoulder.  Normal range of motion.  Normal strength with supraspinatus testing and internal/external rotation, however these maneuvers elicit pain.  Neurovascular intact distally.  Assessment/Plan:  Hypertension At goal.  Continue lisinopril-HCTZ 20-12.5 daily.  Bilateral shoulder pain Discussed management plan with patient.  Check plain film today to rule out underlying osteoarthritis.  He has some tenderness with rotator cuff testing as well.  Offered steroid injection, however patient declined.  Recommended anti-inflammatory however patient declined.  Depending on results may need referral to PT and/or sports medicine.  Preventative Healthcare Patient was instructed to return soon for CPE. Health Maintenance Due  Topic Date Due  . Hepatitis C Screening  12-19-1945   Time Spent: I spent >40 minutes face-to-face with the patient, with more than half spent on coordinating care and counseling for management plan for his bilateral shoulder pain and hypertension.  Algis Greenhouse. [name], MD 07/09/2018 12:45 PM

## 2018-07-09 NOTE — Patient Instructions (Signed)
It was very nice to see you today!  We will check xrays of your shoulders today. Depending on the results we may need to start an anti-inflammatory and/or refer you to a physical therapist.   Please come back soon for your physical with blood work.   Take care, Dr [name]

## 2018-07-10 ENCOUNTER — Telehealth: Payer: Self-pay | Admitting: Family Medicine

## 2018-07-10 NOTE — Progress Notes (Signed)
Please inform patient of the following:  His xrays do not have any sings of arthritis. I think he is having rotator cuff issues. Would like for him to see Dr Paulla Fore to discuss further management.  Leslie Cox. [name], MD 07/10/2018 1:07 PM

## 2018-07-10 NOTE — Telephone Encounter (Signed)
Copied from [place] [id number]. Topic: Quick Communication - See Telephone Encounter >> Jul 10, 2018  3:31 PM Sheran Luz wrote: CRM for notification. See Telephone encounter for: 07/10/18.  Patient called to check status of imaging results from 07/09/2018. Please advise.

## 2018-07-10 NOTE — Telephone Encounter (Signed)
Notified patient of results.Patient voices understanding.

## 2018-07-10 NOTE — Telephone Encounter (Signed)
See note

## 2018-07-13 ENCOUNTER — Ambulatory Visit (INDEPENDENT_AMBULATORY_CARE_PROVIDER_SITE_OTHER): Payer: Medicare HMO

## 2018-07-13 ENCOUNTER — Ambulatory Visit (INDEPENDENT_AMBULATORY_CARE_PROVIDER_SITE_OTHER): Payer: Medicare HMO | Admitting: Sports Medicine

## 2018-07-13 ENCOUNTER — Encounter: Payer: Self-pay | Admitting: Sports Medicine

## 2018-07-13 VITALS — BP 136/76 | HR 82 | Ht 66.0 in | Wt 156.4 lb

## 2018-07-13 DIAGNOSIS — G8929 Other chronic pain: Secondary | ICD-10-CM | POA: Diagnosis not present

## 2018-07-13 DIAGNOSIS — R9389 Abnormal findings on diagnostic imaging of other specified body structures: Secondary | ICD-10-CM

## 2018-07-13 DIAGNOSIS — M479 Spondylosis, unspecified: Secondary | ICD-10-CM

## 2018-07-13 DIAGNOSIS — M25512 Pain in left shoulder: Secondary | ICD-10-CM

## 2018-07-13 DIAGNOSIS — K219 Gastro-esophageal reflux disease without esophagitis: Secondary | ICD-10-CM

## 2018-07-13 DIAGNOSIS — M25511 Pain in right shoulder: Secondary | ICD-10-CM

## 2018-07-13 DIAGNOSIS — M5030 Other cervical disc degeneration, unspecified cervical region: Secondary | ICD-10-CM | POA: Diagnosis not present

## 2018-07-13 MED ORDER — PREDNISONE 50 MG PO TABS: 50.0000 mg | ORAL_TABLET | Freq: Every day | ORAL | 0 refills | Status: AC

## 2018-07-13 NOTE — Progress Notes (Signed)
[name]. Rigby, [place] at [facility] [phone]  Andrius Andrepont - 73 y.o. male MRN [id number]  Date of birth: 09/20/1945  Visit Date: July 13, 2018  PCP: [name], MD   Referred by: [name], MD  SUBJECTIVE:  Chief Complaint  Patient presents with  . New Patient (Initial Visit)    B shoulder pain (L>R).  Ref by Dr. [name].    HPI: Patient is referred today for bilateral shoulder pain that is been insidious in nature and has been progressively worsening over the past several months.  He has moderate pain on the right and severe pain on the left that does radiate into the left arm down towards the hand.  He has been using ice heat ibuprofen with only minimal improvement.  Its worse with reaching overhead and sleeping on his side.  REVIEW OF SYSTEMS: He does have disturbances of sleep due to the pain but Otherwise 12 point review of systems performed and is negative  HISTORY:  Prior history reviewed and updated per electronic medical record.  Social History   Occupational History  . Occupation: Surveyor, quantity: [employer]  . Occupation: Surveyor, quantity: [employer]  Tobacco Use  . Smoking status: Never Smoker  . Smokeless tobacco: Never Used  Substance and Sexual Activity  . Alcohol use: No    Frequency: Never  . Drug use: No  . Sexual activity: Not on file   Social History   Social History Narrative  . Not on file     DATA OBTAINED & REVIEWED:  No results for input(s): HGBA1C, LABURIC, CREATINE, CALCIUM, AST, ALT, TSH in the last 8760 hours.  Invalid input(s): MAGNESIUM, CK Problem  Abnormal X-Ray   No specialty comments available.   The x-rays of the shoulder were previously reviewed as well as updated x-rays that were obtained today that showed a moderate amount of degenerative change within the cervical spine.  The right shoulder did reveal a unknown finding on one view that  has a low likelihood of being a lung mass and further diagnostic evaluation could be pursued and this was discussed with Dr. [name] who will be following this.  OBJECTIVE:  VS:  HT:5\' 6"  (167.6 cm)   WT:156 lb 6.4 oz (70.9 kg)  BMI:25.26    BP:136/76  HR:82bpm  TEMP: ( )  RESP:97 %   PHYSICAL EXAM: Well-developed, Well-nourished and In no acute distress Pupils are equal., EOM intact without nystagmus. and No scleral icterus. Alert & appropriately interactive. and Not depressed or anxious appearing. Warm and well perfused Neck and upper extremities: He has marked anterior chain posture with marked scapular pronation at rest.  He has slightly limited overhead range of motion bilaterally with intrinsic rotator cuff strength that is intact with internal rotation, external rotation, empty can testing, O'Brien's testing and speeds testing.  He has no focal pain with any of these maneuvers.  He has marked limitation in cervical sidebending and rotation.  Mild pain on the left with brachial plexus squeeze and minimal pain on the right.  No pain with arm squeeze test bilaterally.  He does have a positive Hoffman's on the left side, normal on the right with 3+/4 upper extremity DTRs otherwise.  Positive Spurling's compression test bilaterally with a negative Lhermitte's compression test.   ASSESSMENT   1. Chronic right shoulder pain   2. Chronic left shoulder pain   3.  Gastroesophageal reflux disease, esophagitis presence not specified   4. Spondylosis   5. Abnormal x-ray     PLAN:  Pertinent additional documentation may be included in corresponding procedure notes, imaging studies, problem based documentation and patient instructions. Abnormal x-ray We did discuss further diagnostic evaluation of this.  CT scan of the chest was offered versus further plain film x-rays.  He would like to hold off at this time but understands this is an option for him.  He is at low risk for lung cancer given  non-smoking history.  Symptoms are consistent with a cervical radiculitis.  We will plan to start him on a low-dose steroid and have him follow-up in 10 days.  He does have a small amount of hyperreflexia that could be indicative of a potential underlying myelopathy but given his overall good strength and generalized hyperreflexia will defer MRI at this time.  Low threshold for this if persistent symptoms at follow-up.  Meds ordered this encounter  Medications  . predniSONE (DELTASONE) 50 MG tablet    Sig: Take 1 tablet (50 mg total) by mouth daily with breakfast for 5 days.    Dispense:  5 tablet    Refill:  0   Lab Orders  No laboratory test(s) ordered today    Imaging Orders     DG Cervical Spine 2 or 3 views Referral Orders  No referral(s) requested today     Activity modifications and the importance of avoiding exacerbating activities (limiting pain to no more than a 4 / 10 during or following activity) recommended and discussed. Discussed red flag symptoms that warrant earlier emergent evaluation and patient voices understanding.  If any lack of improvement: consider further diagnostic evaluation with MRI of the cervical spine and/or referral to physical therapy.   Return in about 10 days (around 07/23/2018).          Gerda Diss, [facility] Physician

## 2018-07-14 DIAGNOSIS — R9389 Abnormal findings on diagnostic imaging of other specified body structures: Secondary | ICD-10-CM | POA: Insufficient documentation

## 2018-07-14 NOTE — Assessment & Plan Note (Signed)
We did discuss further diagnostic evaluation of this.  CT scan of the chest was offered versus further plain film x-rays.  He would like to hold off at this time but understands this is an option for him.  He is at low risk for lung cancer given non-smoking history.

## 2018-07-20 DIAGNOSIS — R69 Illness, unspecified: Secondary | ICD-10-CM | POA: Diagnosis not present

## 2018-07-24 ENCOUNTER — Ambulatory Visit (INDEPENDENT_AMBULATORY_CARE_PROVIDER_SITE_OTHER): Payer: Medicare HMO | Admitting: Sports Medicine

## 2018-07-24 ENCOUNTER — Encounter: Payer: Self-pay | Admitting: Sports Medicine

## 2018-07-24 VITALS — BP 130/88 | HR 76 | Ht 66.0 in | Wt 156.0 lb

## 2018-07-24 DIAGNOSIS — M479 Spondylosis, unspecified: Secondary | ICD-10-CM | POA: Diagnosis not present

## 2018-07-24 DIAGNOSIS — G8929 Other chronic pain: Secondary | ICD-10-CM | POA: Diagnosis not present

## 2018-07-24 DIAGNOSIS — M25512 Pain in left shoulder: Secondary | ICD-10-CM | POA: Diagnosis not present

## 2018-07-24 MED ORDER — GABAPENTIN 100 MG PO CAPS: ORAL_CAPSULE | ORAL | 1 refills | Status: DC

## 2018-07-24 NOTE — Progress Notes (Signed)
Leslie Cox. Bilan Tedesco, [name] [phone]  Leslie Cox - 73 y.o. male MRN [id number]  Date of birth: May 03, 1946  Visit Date: 07/24/2018   PCP: [name], MD   Referred by: [name], MD  SUBJECTIVE:  Chief Complaint  Patient presents with  . Left Shoulder - Follow-up    B shoulder pain L>R.  C-spine XR- 07/13/18.  B shld XR- 07/09/18.  IBU and prednisone.  . Follow-up    B shoulder pain (L>R).  IBU and prednisone.  C-spine XR- 07/13/18 and B shld XR- 07/09/18    HPI: Patient reports good improvement in his symptoms while taking the prednisone but since stopping it it seems as though he has had a slight return.  He has generalized tightness that radiates from his neck and his shoulders into his bilateral upper arms more so on the left arm towards the left elbow.  Worsens with reaching up and laying on his side.  He has been using heat and ice as well as currently taking ibuprofen for this.  He did take prednisone 50 mg for 5 days reported great improvement with this.  Pt denies any change in bowel or bladder habits, muscle weakness, numbness or falls associated with this pain.  He is continued to have some discomfort at nighttime but this is minimal.  REVIEW OF SYSTEMS: Per HPI  HISTORY:  Prior history reviewed and updated per electronic medical record.  Social History   Occupational History  . Occupation: Surveyor, quantity: [employer]  . Occupation: Surveyor, quantity: [employer]  Tobacco Use  . Smoking status: Never Smoker  . Smokeless tobacco: Never Used  Substance and Sexual Activity  . Alcohol use: No    Frequency: Never  . Drug use: No  . Sexual activity: Not on file   Social History   Social History Narrative  . Not on file    OBJECTIVE:  VS:  HT:5\' 6"  (167.6 cm)   WT:156 lb (70.8 kg)  BMI:25.19    BP:130/88  HR:76bpm  TEMP: ( )  RESP:97 %   PHYSICAL EXAM: Adult male.  No  acute distress.  Alert and however he is quite inappropriate.  He tries to enter another patient's room while going through the rooming process knocking on the door and entering saying he would prefer to be in this room.  He does have a slightly disordered thought process with tangential interruptions.  Bilateral shoulders move quite well and he has good overhead range of motion of the shoulders.  He has limited cervical spine motion and has bilateral paraspinal cervical muscle spasms and tenderness within the brachial plexus region.  He has good internal and external rotation of bilateral shoulders.  No crepitation with axial load and circumduction of the shoulders.  Negative Spurling's compression test for radicular symptoms but he does have some paracervical pain.     ASSESSMENT   1. Chronic left shoulder pain   2. Spondylosis     PROCEDURES:  None  PLAN:  Pertinent additional documentation may be included in corresponding procedure notes, imaging studies, problem based documentation and patient instructions.  Ultimately long discussion today regarding the generalized tightness within his paracervical muscles.  His thought process is quite circumstantial and he is quite inappropriate with staff during the rooming process as well as making inappropriate deviations from the conversation regarding why he was here.  Ultimately I would  like to start him on gabapentin low-dose to see if he responds favorably to this and discussed the option for referral to physical therapy.  He will let us know if he is interested in doing this.  We will have him continue working on gentle range of motion exercises for shoulders  Activity modifications and the importance of avoiding exacerbating activities (limiting pain to no more than a 4 / 10 during or following activity) recommended and discussed. Discussed red flag symptoms that warrant earlier emergent evaluation and patient voices understanding.  Meds  ordered this encounter  Medications  . gabapentin (NEURONTIN) 100 MG capsule    Sig: Start with 1 tab po qhs X 1 week, then increase to 1 tab po bid X 1 week then 1 tab po tid prn    Dispense:  90 capsule    Refill:  1    Lab Orders  No laboratory test(s) ordered today   Imaging Orders  No imaging studies ordered today   Referral Orders  No referral(s) requested today   Return in about 4 weeks (around 08/21/2018).          Gerda Diss, [place] [facility] Physician

## 2018-07-25 ENCOUNTER — Encounter: Payer: Self-pay | Admitting: Sports Medicine

## 2018-08-21 ENCOUNTER — Ambulatory Visit: Payer: Medicare HMO | Admitting: Sports Medicine

## 2018-08-21 ENCOUNTER — Ambulatory Visit (INDEPENDENT_AMBULATORY_CARE_PROVIDER_SITE_OTHER): Payer: Medicare HMO | Admitting: Sports Medicine

## 2018-08-21 ENCOUNTER — Encounter: Payer: Self-pay | Admitting: Sports Medicine

## 2018-08-21 VITALS — BP 130/80 | HR 80 | Ht 66.0 in | Wt 157.2 lb

## 2018-08-21 DIAGNOSIS — M25512 Pain in left shoulder: Secondary | ICD-10-CM

## 2018-08-21 DIAGNOSIS — M479 Spondylosis, unspecified: Secondary | ICD-10-CM | POA: Diagnosis not present

## 2018-08-21 DIAGNOSIS — M25511 Pain in right shoulder: Secondary | ICD-10-CM | POA: Insufficient documentation

## 2018-08-21 DIAGNOSIS — G8929 Other chronic pain: Secondary | ICD-10-CM | POA: Diagnosis not present

## 2018-08-21 DIAGNOSIS — M542 Cervicalgia: Secondary | ICD-10-CM | POA: Diagnosis not present

## 2018-08-21 NOTE — Patient Instructions (Signed)

## 2018-08-21 NOTE — Progress Notes (Signed)
Leslie Cox. Rigby, [place] at [facility] [phone]  Stefano Trulson - 73 y.o. male MRN [id number]  Date of birth: 12/24/1945  Visit Date: August 21, 2018  PCP: [name], MD   Referred by: [name], MD  SUBJECTIVE:  Chief Complaint  Patient presents with  . Follow-up    B shoulder pain.  Gabapentin and Advil.  Prior course of prednisone.    HPI: Patient is here for follow-up of bilateral left worse than right shoulder pain.  He has been taking gabapentin up to 3 times per day with effectively no improvement in his symptoms.  He continues to have significant nighttime disturbances.  He has mild symptoms day-to-day.  His pain is described as being sharp and is a generalized tightness.  He has a hard time fully describing his complaints.  Pain does not seem to radiate past his upper arms.  Is worse with reaching up and opening sides.  Need for prednisone Dosepak and gabapentin have only been mildly helpful.   REVIEW OF SYSTEMS: Per HPI  HISTORY:  Prior history reviewed and updated per electronic medical record.  Patient Active Problem List   Diagnosis Date Noted  . Bilateral shoulder pain 08/21/2018    C-spine XR - 07/13/18  B shoulder XR - 07/09/18   . Abnormal x-ray 07/14/2018  . Right inguinal hernia 02/23/2014  . Hypertension 10/02/2012  . Acid reflux 09/07/2012   Social History   Occupational History  . Occupation: Surveyor, quantity: [employer]  . Occupation: Surveyor, quantity: [employer]  Tobacco Use  . Smoking status: Never Smoker  . Smokeless tobacco: Never Used  Substance and Sexual Activity  . Alcohol use: No    Frequency: Never  . Drug use: No  . Sexual activity: Not on file   Social History   Social History Narrative  . Not on file    OBJECTIVE:  VS:  HT:5\' 6"  (167.6 cm)   WT:157 lb 3.2 oz (71.3 kg)  BMI:25.38    BP:130/80  HR:80bpm  TEMP: ( )  RESP:98 %   PHYSICAL  EXAM: Adult male.  Flattened affect.  Circumferential thought process Patient is left shoulder has slight pain with Hawkins and Neer's testing.  His intrinsic rotator cuff strength is intact.  He does have pain brachial plexus squeeze.  His upper extremity reflexes are brisk throughout with a negative Hoffmann sign today.  He has a negative Spurling's compression test and Lhermitte's compression test.   ASSESSMENT:  1. Neck pain   2. Chronic pain of both shoulders   3. Spondylosis     PROCEDURES:  PROCEDURE NOTE: [place] Guided: Injection: Leftshoulder, Subacromial  DESCRIPTION OF PROCEDURE:  The patient's clinical condition is marked by substantial pain and/or significant functional disability. Other conservative therapy has not provided relief, is contraindicated, or not appropriate. There is a reasonable likelihood that injection will significantly improve the patient's pain and/or functional impairment.  After discussing the risks, benefits and expected outcomes of the injection and all questions were reviewed and answered, the patient wished to undergo the above named procedure. Verbal consent was obtained. The skin was then prepped in sterile fashion and the target structure was injected as below:  Single injection performed as below:  PREP: Alcohol and Ethel Chloride  APPROACH: posterior, single injection, 22g 1.5 in.  INJECTATE: 3 cc 0.5% Marcaine and 1 cc 40mg /mL DepoMedrol  DRESSING: Band-Aid  Post  procedural instructions including recommending icing and warning signs for infection were reviewed.  This procedure was well tolerated and there were no complications.       PLAN:  Pertinent additional documentation may be included in corresponding procedure notes, imaging studies, problem based documentation and patient instructions.  No problem-specific Assessment & Plan notes found for this encounter.  Patient does have signs of impingement today.  He responded the best to the  prednisone so we will try a subacromial injection for both diagnostic and therapeutic purposes.  Referral to physical therapy placed today and emphasized the importance of therapeutic exercises.  His reflexes are slightly improved but he does not like the gabapentin has been effective at all.  We will plan to have him stop the gabapentin but he does notice any worsening we could consider increasing his dose to 300 mg 3 times a day he will call us if this is something he would like to do.  Referral to physical therapy placed today  Activity modifications and the importance of avoiding exacerbating activities (limiting pain to no more than a 4 / 10 during or following activity) recommended and discussed.   Discussed red flag symptoms that warrant earlier emergent evaluation and patient voices understanding.    No orders of the defined types were placed in this encounter. Lab Orders  No laboratory test(s) ordered today   Imaging Orders  No imaging studies ordered today   Referral Orders  Ambulatory referral to Physical Therapy   At follow up will plan to consider : Advanced diagnostic imaging of the cervical spine and/or xshoulder  Return in about 6 weeks (around 10/02/2018).          Gerda Diss, [facility] Physician

## 2018-08-26 ENCOUNTER — Ambulatory Visit: Payer: Medicare HMO | Attending: Sports Medicine | Admitting: Physical Therapy

## 2018-08-26 ENCOUNTER — Encounter: Payer: Self-pay | Admitting: Physical Therapy

## 2018-08-26 ENCOUNTER — Other Ambulatory Visit: Payer: Self-pay

## 2018-08-26 DIAGNOSIS — G8929 Other chronic pain: Secondary | ICD-10-CM | POA: Diagnosis not present

## 2018-08-26 DIAGNOSIS — M25511 Pain in right shoulder: Secondary | ICD-10-CM | POA: Diagnosis not present

## 2018-08-26 DIAGNOSIS — R293 Abnormal posture: Secondary | ICD-10-CM | POA: Insufficient documentation

## 2018-08-26 DIAGNOSIS — M25512 Pain in left shoulder: Secondary | ICD-10-CM | POA: Diagnosis not present

## 2018-08-26 DIAGNOSIS — M542 Cervicalgia: Secondary | ICD-10-CM | POA: Insufficient documentation

## 2018-08-26 DIAGNOSIS — M6281 Muscle weakness (generalized): Secondary | ICD-10-CM | POA: Diagnosis not present

## 2018-08-26 NOTE — Patient Instructions (Signed)
Access Code: [email or web address]  URL: [email or web address]  Date: 08/26/2018  Prepared by: Venetia Night Beuhring   Exercises  Supine Chin Tuck - 10 reps - 2 sets - 10 hold - 1x daily - 7x weekly  Seated Scapular Retraction - 10 reps - 3 sets - 1x daily - 7x weekly  Doorway Pec Stretch at 90 Degrees Abduction - 3 reps - 3 sets - 30 hold - 1x daily - 7x weekly

## 2018-08-26 NOTE — Therapy (Signed)
[facility] [address] [place], [place], [address] Phone: [phone]   Fax:  [phone]  Physical Therapy Evaluation  Patient Details  Name: Leslie Cox MRN: [id number] Date of Birth: 01-12-1946 Referring Provider (PT): Gerda Diss, DO   Encounter Date: 08/26/2018  PT End of Session - 08/26/18 1918    Visit Number  1    Date for PT Re-Evaluation  10/21/18    Authorization Type  [employer]    PT Start Time  0800    PT Stop Time  0843    PT Time Calculation (min)  43 min    Activity Tolerance  Patient tolerated treatment well    Behavior During Therapy  Agitated       Past Medical History:  Diagnosis Date  . GERD (gastroesophageal reflux disease)   . Hypertension   . PARESTHESIA 03/16/2010   Qualifier: Diagnosis of  By: Burnice Logan  MD, Doretha Sou   . Right acetabular fracture ([place]) 09/15/2012  . Unspecified vitamin D deficiency 09/14/2012    Past Surgical History:  Procedure Laterality Date  . EXTERNAL FIXATION LEG Right 09/05/2012   Procedure: Application of traction bow externally;  Surgeon: Mauri Pole, MD;  Location: [place];  Service: Orthopedics;  Laterality: Right;  . ORIF ACETABULAR FRACTURE Right 09/08/2012   Procedure: OPEN REDUCTION INTERNAL FIXATION (ORIF) ACETABULAR FRACTURE;  Surgeon: Rozanna Box, MD;  Location: [place];  Service: Orthopedics;  Laterality: Right;  . TONSILLECTOMY      There were no vitals filed for this visit.   Subjective Assessment - 08/26/18 0802    Subjective  Pt comes to PT with history of several months of bil shoulder Lt>Rt pain and neck pain.  Had an injection in Lt shoulder 2/21 which has helped somewhat.      Limitations  Lifting;Other (comment)   laying on sides   Diagnostic tests  xrays bil shoulders and c-spine: early DDD, downward sloped acromion on Rt    Patient Stated Goals  make the pain go away, sleep better at night (be able to lay on sides more  comfortably)    Currently in Pain?  Yes    Pain Score  3     Pain Location  Shoulder    Pain Orientation  Left    Pain Descriptors / Indicators  Aching    Pain Type  Chronic pain    Pain Onset  More than a month ago    Pain Frequency  Intermittent    Aggravating Factors   laying on side, lifting    Pain Relieving Factors  Aleve, rest    Effect of Pain on Daily Activities  sleep, lifting    Multiple Pain Sites  Yes    Pain Score  3    Pain Location  Neck    Pain Orientation  Left    Pain Descriptors / Indicators  Aching;Dull    Pain Radiating Towards  upper arm, posterior    Pain Onset  More than a month ago    Pain Frequency  Intermittent    Aggravating Factors   can't think of anything    Pain Relieving Factors  Aleve, REst    Effect of Pain on Daily Activities  sleep         [facility] PT Assessment - 08/26/18 0001      Assessment   Medical Diagnosis  M25.511,G89.29,M25.512 (ICD-10-CM) - Chronic pain of both shouldersM25.511,G89.29,M25.512 (ICD-10-CM) - Chronic pain of  both shoulders M25.511,G89.29,M25.512 (ICD-10-CM) - Chronic pain of both shoulders M54.2 (ICD-10-CM) - Neck pain    Referring Provider (PT)  Gerda Diss, DO    Onset Date/Surgical Date  --   several months ago   Hand Dominance  Right    Next MD Visit  --   a couple weeks   Prior Therapy  no      Precautions   Precautions  None      Restrictions   Weight Bearing Restrictions  No      Balance Screen   Has the patient fallen in the past 6 months  No      [place] residence    Living Arrangements  Spouse/significant other    Type of [place]     Prior Function   Level of Independence  Independent    Vocation  Part time employment    IT consultant    Leisure  reading, walking      Cognition   Overall Cognitive Status  Within Functional Limits for tasks assessed      Observation/Other Assessments   Observations  has some  difficulty describing symptoms    Focus on Therapeutic Outcomes (FOTO)   69%   goal 42%     Posture/Postural Control   Posture/Postural Control  Postural limitations    Postural Limitations  Rounded Shoulders;Forward head;Increased thoracic kyphosis    Posture Comments  stiffness in cervical and thoracic spine prevents ability to fully perform neck retraction      Tone   Assessment Location  --   signif tone in bil SCM, upper traps, SO     ROM / Strength   AROM / PROM / Strength  AROM;Strength      AROM   AROM Assessment Site  Cervical;Shoulder    Right/Left Shoulder  Right;Left    Right Shoulder Flexion  130 Degrees   PROM 150   Right Shoulder ABduction  130 Degrees    Right Shoulder Internal Rotation  --   fingers to L1 with pain   Right Shoulder External Rotation  70 Degrees    Left Shoulder Flexion  130 Degrees   PROM 145   Left Shoulder ABduction  130 Degrees   pain   Left Shoulder Internal Rotation  --   fingers to L1 with pain   Left Shoulder External Rotation  70 Degrees    Cervical Flexion  65    Cervical Extension  45    Cervical - Right Side Bend  25    Cervical - Left Side Bend  40    Cervical - Right Rotation  75    Cervical - Left Rotation  60      Strength   Overall Strength Comments  bil 4-/5 shoulder abd, ER, 4/5 all other UE muscles groups bil, 4+/5 cervical spine    Strength Assessment Site  Shoulder    Right/Left Shoulder  --      Palpation   Spinal mobility  poor thoracic extension throughout, poor mobility, poor mobility mid and lower cspine   rigid forward head posture   Palpation comment  tender: bil SO, bil SCM, supraspinatus tendon bil, bicep tendon bil, upper traps bil      Special Tests    Special Tests  Rotator Cuff Impingement    Rotator Cuff Impingment tests  Michel Bickers test;Neer impingement test      Neer Impingement test  Findings  Positive    Side  --    Comments  both      Hawkins-Kennedy test   Findings  Positive     Comments  both                Objective measurements completed on examination: See above findings.              PT Education - 08/26/18 0840    Education Details  Access Code: [email or web address]    Person(s) Educated  Patient    Methods  Explanation;Demonstration;Verbal cues;Tactile cues;Handout    Comprehension  Verbalized understanding;Returned demonstration;Tactile cues required;Verbal cues required       PT Short Term Goals - 08/26/18 1926      PT SHORT TERM GOAL #1   Title  I in initial HEP for ROM, stretching, postural re-ed.    Time  3    Period  Weeks    Status  New    Target Date  09/16/18      PT SHORT TERM GOAL #2   Title  Pt will report reduction of pain at night by at least 20% to improve sleep.    Time  4    Period  Weeks    Status  New    Target Date  09/23/18      PT SHORT TERM GOAL #3   Title  Pt will improve bil shoulder flexion to at least 145 deg to improve overhead reaching.    Time  4    Period  Weeks    Status  New    Target Date  09/23/18      PT SHORT TERM GOAL #4   Title  Pt will achieve bil cervical rotation to at least 70 degrees to improve activities such as driving.    Time  4    Period  Weeks    Status  New    Target Date  09/23/18      PT SHORT TERM GOAL #5   Title  Pt will be able to demo proper posture throughout thoracic and cervical spine to reduce strain on muscles and joints.    Time  4    Period  Weeks    Status  New    Target Date  09/23/18        PT Long Term Goals - 08/26/18 1936      PT LONG TERM GOAL #1   Title  Pt will be I in advanced HEP and understand how to safely progress.    Time  8    Period  Weeks    Status  New    Target Date  10/21/18      PT LONG TERM GOAL #2   Title  Pt will be able to perform overhead reaching tasks to at least 155 deg with pain not to exceed 3/10.    Time  8    Period  Weeks    Status  New    Target Date  10/21/18      PT LONG TERM GOAL #3   Title  Pt will  reduce FOTO score to </= 42% to demo less limitation in daily activities.    Time  8    Period  Weeks    Status  New    Target Date  10/21/18      PT LONG TERM GOAL #4   Title  Pt will be able to lay on sides at night with  pain rating </= 3/10.    Time  8    Period  Weeks    Status  New    Target Date  10/21/18      PT LONG TERM GOAL #5   Title  Pt will achieve strength in bil shoulders and neck musculature of at least 4+/5 throughout to reduce strain on spine.    Time  8    Period  Weeks    Status  New    Target Date  10/21/18             Plan - 08/26/18 1919    Clinical Impression Statement  Pt presents to PT with recent onset of bil shoulder pain Lt>Rt and neck pain.  Pt had recent injection in Lt shoulder which he states helped somewhat.  He has reduced ROM in cervical spine and bil shoulder flexion/abduction actively > passively.  He has significant postural deficits including forward head, rounded shoulders and increased thoracic kyphosis.  Significant restriction is present throughout joints of mid/lower c-spine and thoracic spine making it difficulty for Pt to perform postural corrections.  He will benefit from manual therapy to address this stiffness.  He has mild + impingement signs in bil shoulders.  Diffuse tenderness is present throughout anterior and lateral shoulders and cervical muscle groups, mostly notably SCM, upper traps, SO group and paraspinals.  He will benefit from skilled PT to address ROM, flexibility, strength, postural re-ed, cervical and scapular stabilization, Pt education and modalities for pain.    History and Personal Factors relevant to plan of care:  somewhat agitated and at times had difficulty expressing/communicating answers to PT's questions    Clinical Presentation  Evolving    Clinical Decision Making  Moderate    Rehab Potential  Good    PT Frequency  2x / week    PT Duration  8 weeks    PT Treatment/Interventions  ADLs/Self Care Home  Management;Cryotherapy;Electrical Stimulation;Iontophoresis 4mg /ml Dexamethasone;Moist Heat;Traction;Neuromuscular re-education;Therapeutic exercise;Therapeutic activities;Functional mobility training;Patient/family education;Manual techniques;Passive range of motion;Dry needling;Taping;Spinal Manipulations;Joint Manipulations    PT Next Visit Plan  f/u on initial HEP, manual for improved soft tissue and joint mobility c- and t-spine, shoulder pulleys/AA/ROM, postural re-ed, scapular stabilization    PT Home Exercise Plan  Access Code: [email or web address]    Consulted and Agree with Plan of Care  Patient       Patient will benefit from skilled therapeutic intervention in order to improve the following deficits and impairments:  Decreased range of motion, Impaired UE functional use, Pain, Hypomobility, Impaired flexibility, Postural dysfunction, Decreased strength, Decreased mobility, Increased fascial restricitons  Visit Diagnosis: Chronic left shoulder pain - Plan: PT plan of care cert/re-cert  Chronic right shoulder pain - Plan: PT plan of care cert/re-cert  Abnormal posture - Plan: PT plan of care cert/re-cert  Muscle weakness (generalized) - Plan: PT plan of care cert/re-cert  Cervicalgia - Plan: PT plan of care cert/re-cert     Problem List Patient Active Problem List   Diagnosis Date Noted  . Bilateral shoulder pain 08/21/2018  . Abnormal x-ray 07/14/2018  . Right inguinal hernia 02/23/2014  . Hypertension 10/02/2012  . Acid reflux 09/07/2012    Baruch Merl, PT 08/26/18 7:39 PM   [facility] [address] [place], [place], [address] Phone: [phone]   Fax:  [phone]  Name: Kaisei Gilbo MRN: [id number] Date of Birth: 02/06/1946

## 2018-08-28 ENCOUNTER — Encounter: Payer: Self-pay | Admitting: Family Medicine

## 2018-08-28 ENCOUNTER — Ambulatory Visit (INDEPENDENT_AMBULATORY_CARE_PROVIDER_SITE_OTHER): Payer: Medicare HMO | Admitting: Family Medicine

## 2018-08-28 VITALS — BP 122/73 | HR 99 | Temp 97.6°F | Ht 66.0 in | Wt 154.2 lb

## 2018-08-28 DIAGNOSIS — K4090 Unilateral inguinal hernia, without obstruction or gangrene, not specified as recurrent: Secondary | ICD-10-CM | POA: Diagnosis not present

## 2018-08-28 DIAGNOSIS — R1031 Right lower quadrant pain: Secondary | ICD-10-CM

## 2018-08-28 DIAGNOSIS — R143 Flatulence: Secondary | ICD-10-CM | POA: Diagnosis not present

## 2018-08-28 DIAGNOSIS — I10 Essential (primary) hypertension: Secondary | ICD-10-CM

## 2018-08-28 NOTE — Assessment & Plan Note (Signed)
At goal.  Continue lisinopril-HCTZ 20-12.5 mg tablet once daily.

## 2018-08-28 NOTE — Progress Notes (Signed)
   Chief Complaint:  Leslie Cox is a 73 y.o. male who presents for same day appointment with a chief complaint of groin pain.   Assessment/Plan:  Groin Pain Unsure if this represents new hernia, scar tissue, or complication of previous hernia repair.  Will place referral back to surgery for further evaluation.  Discussed ER precautions.  Recommended diet high in fiber, good oral hydration, and over-the-counter analgesics as needed.  Recommended simethicone as needed for gas/flatulence.  Hypertension At goal.  Continue lisinopril-HCTZ 20-12.5 mg tablet once daily.     Subjective:  HPI:  Groin Pain, acute problem Started 2 days ago.  He has a history of right inguinal hernia that was repaired 5 years ago by Dr. Rosendo Gros.  He is also noticed increased flatulence over that time.  Pain is worse with certain movements.  No constipation.  No diarrhea.  No nausea or vomiting.  No fevers or chills.  No specific treatments tried.  His stable, chronic medical conditions are outlined below:  # Hypertension -On lisinopril-HCTZ 20-12.5 mg once daily and tolerating well. - ROS: No reported chest pain or shortness of breath.  ROS: Per HPI  PMH: He reports that he has never smoked. He has never used smokeless tobacco. He reports that he does not drink alcohol or use drugs.      Objective:  Physical Exam: BP 122/73 (BP Location: Left Arm, Patient Position: Sitting, Cuff Size: Normal)   Pulse 99   Temp 97.6 F (36.4 C) (Oral)   Ht 5\' 6"  (1.676 m)   Wt 154 lb 4 oz (70 kg)   SpO2 97%   BMI 24.90 kg/m   Gen: NAD, resting comfortably CV: Regular rate and rhythm with no murmurs appreciated Pulm: Normal work of breathing, clear to auscultation bilaterally with no crackles, wheezes, or rhonchi GI: Normal bowel sounds present. Soft, Nontender, Nondistended. GU: Subtle bulge noted right inguinal crease.  Worse with Valsalva maneuver.      Algis Greenhouse. [name], MD 08/28/2018 2:34 PM

## 2018-08-28 NOTE — Patient Instructions (Signed)
It was very nice to see you today!  Please make sure you are getting plenty of fiber in your diet.  You can use simethicone as needed for gas.  You can use naproxen as needed for abdominal pain.  We will refer you to see Dr. Rosendo Gros.  Take care, Dr Maryann Alar, Adult     A hernia happens when tissue inside your body pushes out through a weak spot in your belly muscles (abdominal wall). This makes a round lump (bulge). The lump may be:  In a scar from surgery that was done in your belly (incisional hernia).  Near your belly button (umbilical hernia).  In your groin (inguinal hernia). Your groin is the area where your leg meets your lower belly (abdomen). This kind of hernia could also be: ? In your scrotum, if you are male. ? In folds of skin around your vagina, if you are male.  In your upper thigh (femoral hernia).  Inside your belly (hiatal hernia). This happens when your stomach slides above the muscle between your belly and your chest (diaphragm). If your hernia is small and it does not cause pain, you may not need treatment. If your hernia is large or it causes pain, you may need surgery. Follow these instructions at home: Activity  Avoid stretching or overusing (straining) the muscles near your hernia. Straining can happen when you: ? Lift something heavy. ? Poop (have a bowel movement).  Do not lift anything that is heavier than 10 lb (4.5 kg), or the limit that you are told, until your doctor says that it is safe.  Use the strength of your legs when you lift something heavy. Do not use only your back muscles to lift. General instructions  Do these things if told by your doctor so you do not have trouble pooping (constipation): ? Drink enough fluid to keep your pee (urine) pale yellow. ? Eat foods that are high in fiber. These include fresh fruits and vegetables, whole grains, and beans. ? Limit foods that are high in fat and processed sugars. These include  foods that are fried or sweet. ? Take medicine for trouble pooping.  When you cough, try to cough gently.  You may try to push your hernia in by very gently pressing on it when you are lying down. Do not try to force the bulge back in if it will not push in easily.  If you are overweight, work with your doctor to lose weight safely.  Do not use any products that have nicotine or tobacco in them. These include cigarettes and e-cigarettes. If you need help quitting, ask your doctor.  If you will be having surgery (hernia repair), watch your hernia for changes in shape, size, or color. Tell your doctor if you see any changes.  Take over-the-counter and prescription medicines only as told by your doctor.  Keep all follow-up visits as told by your doctor. Contact a doctor if:  You get new pain, swelling, or redness near your hernia.  You poop fewer times in a week than normal.  You have trouble pooping.  You have poop (stool) that is more dry than normal.  You have poop that is harder or larger than normal. Get help right away if:  You have a fever.  You have belly pain that gets worse.  You feel sick to your stomach (nauseous).  You throw up (vomit).  Your hernia cannot be pushed in by very gently pressing on it  when you are lying down. Do not try to force the bulge back in if it will not push in easily.  Your hernia: ? Changes in shape or size. ? Changes color. ? Feels hard or it hurts when you touch it. These symptoms may represent a serious problem that is an emergency. Do not wait to see if the symptoms will go away. Get medical help right away. Call your local emergency services (911 in the [place] Summary  A hernia happens when tissue inside your body pushes out through a weak spot in the belly muscles. This creates a bulge.  If your hernia is small and it does not hurt, you may not need treatment. If your hernia is large or it hurts, you may need surgery.  If you  will be having surgery, watch your hernia for changes in shape, size, or color. Tell your doctor about any changes. This information is not intended to replace advice given to you by your health care provider. Make sure you discuss any questions you have with your health care provider. Document Released: 12/05/2009 Document Revised: 03/19/2017 Document Reviewed: 03/19/2017 Elsevier Interactive Patient Education  2019 [employer].

## 2018-08-31 DIAGNOSIS — H938X2 Other specified disorders of left ear: Secondary | ICD-10-CM | POA: Diagnosis not present

## 2018-08-31 DIAGNOSIS — H6121 Impacted cerumen, right ear: Secondary | ICD-10-CM | POA: Diagnosis not present

## 2018-08-31 DIAGNOSIS — R1031 Right lower quadrant pain: Secondary | ICD-10-CM | POA: Diagnosis not present

## 2018-09-01 ENCOUNTER — Ambulatory Visit: Payer: Medicare HMO | Attending: Sports Medicine | Admitting: Physical Therapy

## 2018-09-01 ENCOUNTER — Encounter: Payer: Self-pay | Admitting: Physical Therapy

## 2018-09-01 DIAGNOSIS — M25511 Pain in right shoulder: Secondary | ICD-10-CM | POA: Diagnosis not present

## 2018-09-01 DIAGNOSIS — G8929 Other chronic pain: Secondary | ICD-10-CM | POA: Diagnosis not present

## 2018-09-01 DIAGNOSIS — M25512 Pain in left shoulder: Secondary | ICD-10-CM | POA: Diagnosis not present

## 2018-09-01 DIAGNOSIS — M542 Cervicalgia: Secondary | ICD-10-CM | POA: Diagnosis not present

## 2018-09-01 DIAGNOSIS — M6281 Muscle weakness (generalized): Secondary | ICD-10-CM

## 2018-09-01 DIAGNOSIS — R293 Abnormal posture: Secondary | ICD-10-CM | POA: Diagnosis not present

## 2018-09-01 NOTE — Therapy (Addendum)
[facility] [address], [place], [place], [address] Phone: [phone]   Fax:  [phone]  Physical Therapy Treatment/Discharge  Patient Details  Name: Leslie Cox MRN: [id number] Date of Birth: 04-17-1946 Referring Provider (PT): Gerda Diss, DO   Encounter Date: 09/01/2018  PT End of Session - 09/01/18 0825    Visit Number  2    Date for PT Re-Evaluation  10/21/18    Authorization Type  [employer]    PT Start Time  0800    PT Stop Time  0840    PT Time Calculation (min)  40 min    Activity Tolerance  Patient tolerated treatment well;No increased pain    Behavior During Therapy  Flat affect       Past Medical History:  Diagnosis Date  . GERD (gastroesophageal reflux disease)   . Hypertension   . PARESTHESIA 03/16/2010   Qualifier: Diagnosis of  By: Burnice Logan  MD, Doretha Sou   . Right acetabular fracture ([place]) 09/15/2012  . Unspecified vitamin D deficiency 09/14/2012    Past Surgical History:  Procedure Laterality Date  . EXTERNAL FIXATION LEG Right 09/05/2012   Procedure: Application of traction bow externally;  Surgeon: Mauri Pole, MD;  Location: [place];  Service: Orthopedics;  Laterality: Right;  . ORIF ACETABULAR FRACTURE Right 09/08/2012   Procedure: OPEN REDUCTION INTERNAL FIXATION (ORIF) ACETABULAR FRACTURE;  Surgeon: Rozanna Box, MD;  Location: [facility];  Service: Orthopedics;  Laterality: Right;  . TONSILLECTOMY      There were no vitals filed for this visit.  Subjective Assessment - 09/01/18 0759    Subjective  Pt reports that things are going ok. He was able to do some of his exercises. No pain currently. He also noted that his pain was not too bad last night.     Limitations  Lifting;Other (comment)   laying on sides   Diagnostic tests  xrays bil shoulders and c-spine: early DDD, downward sloped acromion on Rt    Patient Stated Goals  make the pain go away, sleep better at night (be  able to lay on sides more comfortably)    Currently in Pain?  No/denies    Pain Onset  More than a month ago    Pain Onset  More than a month ago                       [facility] Adult PT Treatment/Exercise - 09/01/18 0001      Exercises   Exercises  Shoulder      Shoulder Exercises: Supine   Other Supine Exercises  B horizontal abduction with red TB 2x10 reps       Shoulder Exercises: Seated   Other Seated Exercises  thoracic rotation with therapist overpressure x10 reps Lt/Rt       Shoulder Exercises: Sidelying   Other Sidelying Exercises  open book stretch x15 reps each       Shoulder Exercises: Standing   Other Standing Exercises  BUE Rows with green TB x15 reps     Other Standing Exercises  attempted closed chain scap protraction x10 reps (pt having difficulty with this despite cues)       Manual Therapy   Manual Therapy  Joint mobilization    Joint Mobilization  Grade III-IV CPAs upper thoracic and cervical spine x1 bout (+ 3 cavitations noted mid thoracic spine)  PT Education - 09/01/18 [phone]    Education Details  updated HEP; technique with therex    Person(s) Educated  Patient    Methods  Explanation;Tactile cues;Handout    Comprehension  Verbalized understanding;Need further instruction;Tactile cues required       PT Short Term Goals - 08/26/18 1926      PT SHORT TERM GOAL #1   Title  I in initial HEP for ROM, stretching, postural re-ed.    Time  3    Period  Weeks    Status  New    Target Date  09/16/18      PT SHORT TERM GOAL #2   Title  Pt will report reduction of pain at night by at least 20% to improve sleep.    Time  4    Period  Weeks    Status  New    Target Date  09/23/18      PT SHORT TERM GOAL #3   Title  Pt will improve bil shoulder flexion to at least 145 deg to improve overhead reaching.    Time  4    Period  Weeks    Status  New    Target Date  09/23/18      PT SHORT TERM GOAL #4   Title  Pt will  achieve bil cervical rotation to at least 70 degrees to improve activities such as driving.    Time  4    Period  Weeks    Status  New    Target Date  09/23/18      PT SHORT TERM GOAL #5   Title  Pt will be able to demo proper posture throughout thoracic and cervical spine to reduce strain on muscles and joints.    Time  4    Period  Weeks    Status  New    Target Date  09/23/18        PT Long Term Goals - 08/26/18 1936      PT LONG TERM GOAL #1   Title  Pt will be I in advanced HEP and understand how to safely progress.    Time  8    Period  Weeks    Status  New    Target Date  10/21/18      PT LONG TERM GOAL #2   Title  Pt will be able to perform overhead reaching tasks to at least 155 deg with pain not to exceed 3/10.    Time  8    Period  Weeks    Status  New    Target Date  10/21/18      PT LONG TERM GOAL #3   Title  Pt will reduce FOTO score to </= 42% to demo less limitation in daily activities.    Time  8    Period  Weeks    Status  New    Target Date  10/21/18      PT LONG TERM GOAL #4   Title  Pt will be able to lay on sides at night with pain rating </= 3/10.    Time  8    Period  Weeks    Status  New    Target Date  10/21/18      PT LONG TERM GOAL #5   Title  Pt will achieve strength in bil shoulders and neck musculature of at least 4+/5 throughout to reduce strain on spine.    Time  8  Period  Weeks    Status  New    Target Date  10/21/18            Plan - 09/01/18 0825    Clinical Impression Statement  Pt arrived today having been completing his HEP since the evaluation. He does note improvements in his sleeping and denies pain at arrival. Session focused on manual treatment to improve thoracic and cervical mobility restrictions and followed with therex to further improve mobility and scapular strength. He does require consistent cuing throughout his session in order to improve understanding of exercise technique and demonstrates poor  scapular control. Ended without increase in shoulder pain and several updates to HEP.    Rehab Potential  Good    PT Frequency  2x / week    PT Duration  8 weeks    PT Treatment/Interventions  ADLs/Self Care Home Management;Cryotherapy;Electrical Stimulation;Iontophoresis 11m/ml Dexamethasone;Moist Heat;Traction;Neuromuscular re-education;Therapeutic exercise;Therapeutic activities;Functional mobility training;Patient/family education;Manual techniques;Passive range of motion;Dry needling;Taping;Spinal Manipulations;Joint Manipulations    PT Next Visit Plan  manual for improved soft tissue and joint mobility c- and t-spine, lat stretch?; shoulder pulleys/AA/ROM, postural re-ed, scapular stabilization    PT Home Exercise Plan  Access Code: [email or web address]   Consulted and Agree with Plan of Care  Patient       Patient will benefit from skilled therapeutic intervention in order to improve the following deficits and impairments:  Decreased range of motion, Impaired UE functional use, Pain, Hypomobility, Impaired flexibility, Postural dysfunction, Decreased strength, Decreased mobility, Increased fascial restricitons  Visit Diagnosis: Chronic left shoulder pain  Chronic right shoulder pain  Abnormal posture  Cervicalgia  Muscle weakness (generalized)     Problem List Patient Active Problem List   Diagnosis Date Noted  . Bilateral shoulder pain 08/21/2018  . Abnormal x-ray 07/14/2018  . Right inguinal hernia 02/23/2014  . Hypertension 10/02/2012  . Acid reflux 09/07/2012   8:40 AM,09/01/18 SSherol DadePT, DPT [facility] [address] [place] [address]Phone: [phone]  Fax:  [phone] Name: Leslie SwigertMRN: [id number] of Birth: 108/15/1947  *addendum to resolve episode of care and d/c pt from PT  [place] Vis[place] from Start of Care: 2  Current functional level related to goals / functional outcomes: See above for more details    Remaining deficits: See above for more details    Education / Equipment: See above for more details   Plan: Patient agrees to discharge.  Patient goals were not met. Patient is being discharged due to the patient's request.  ?????         12:09 PM,09/22/18 SSherol DadePT, DA[place]

## 2018-09-01 NOTE — Patient Instructions (Signed)
Access Code: [email or web address]  URL: [email or web address]  Date: 09/01/2018  Prepared by: Sherol Dade   Exercises  Supine Chin Tuck - 10 reps - 2 sets - 10 hold - 1x daily - 7x weekly  Seated Scapular Retraction - 10 reps - 3 sets - 1x daily - 7x weekly  Doorway Pec Stretch at 90 Degrees Abduction - 3 reps - 3 sets - 30 hold - 1x daily - 7x weekly  Supine Shoulder Horizontal Abduction with Resistance - 10 reps - 2 sets - 1x daily - 7x weekly    [facility] [address], [place], [place] [address] Phone # [phone] Fax [phone]

## 2018-09-10 ENCOUNTER — Other Ambulatory Visit: Payer: Self-pay | Admitting: General Surgery

## 2018-09-10 DIAGNOSIS — R1031 Right lower quadrant pain: Secondary | ICD-10-CM

## 2018-09-11 ENCOUNTER — Encounter: Payer: Self-pay | Admitting: Physical Therapy

## 2018-09-11 ENCOUNTER — Ambulatory Visit
Admission: RE | Admit: 2018-09-11 | Discharge: 2018-09-11 | Disposition: A | Discharge status: Home / Self Care | Payer: Medicare HMO | Source: Ambulatory Visit | Attending: General Surgery | Admitting: General Surgery

## 2018-09-11 ENCOUNTER — Other Ambulatory Visit: Payer: Self-pay

## 2018-09-11 DIAGNOSIS — R1030 Lower abdominal pain, unspecified: Secondary | ICD-10-CM | POA: Diagnosis not present

## 2018-09-11 DIAGNOSIS — R1031 Right lower quadrant pain: Secondary | ICD-10-CM

## 2018-09-15 ENCOUNTER — Encounter: Payer: Self-pay | Admitting: Physical Therapy

## 2018-09-17 ENCOUNTER — Other Ambulatory Visit: Payer: Self-pay | Admitting: General Surgery

## 2018-09-17 DIAGNOSIS — R1031 Right lower quadrant pain: Secondary | ICD-10-CM

## 2018-09-18 ENCOUNTER — Ambulatory Visit
Admission: RE | Admit: 2018-09-18 | Discharge: 2018-09-18 | Disposition: A | Discharge status: Home / Self Care | Payer: Medicare HMO | Source: Ambulatory Visit | Attending: General Surgery | Admitting: General Surgery

## 2018-09-18 DIAGNOSIS — R1031 Right lower quadrant pain: Secondary | ICD-10-CM

## 2018-09-25 ENCOUNTER — Encounter: Payer: Self-pay | Admitting: Physical Therapy

## 2018-09-30 ENCOUNTER — Encounter: Payer: Self-pay | Admitting: Physical Therapy

## 2018-10-02 ENCOUNTER — Ambulatory Visit: Payer: Medicare HMO | Admitting: Sports Medicine

## 2018-10-12 ENCOUNTER — Other Ambulatory Visit: Payer: Self-pay

## 2018-10-12 MED ORDER — LISINOPRIL-HYDROCHLOROTHIAZIDE 20-12.5 MG PO TABS: 1.0000 | ORAL_TABLET | Freq: Every day | ORAL | 2 refills | Status: DC

## 2018-12-04 ENCOUNTER — Encounter: Payer: Self-pay | Admitting: Family Medicine

## 2018-12-04 ENCOUNTER — Other Ambulatory Visit: Payer: Self-pay

## 2018-12-04 ENCOUNTER — Ambulatory Visit (INDEPENDENT_AMBULATORY_CARE_PROVIDER_SITE_OTHER): Payer: Medicare HMO | Admitting: Family Medicine

## 2018-12-04 VITALS — BP 136/80 | HR 88 | Temp 98.1°F | Ht 66.0 in | Wt 158.0 lb

## 2018-12-04 DIAGNOSIS — S40022A Contusion of left upper arm, initial encounter: Secondary | ICD-10-CM

## 2018-12-04 NOTE — Progress Notes (Signed)
Patient: Leslie Cox MRN: [id number] DOB: 1946/03/21 PCP: [name], MD     Subjective:  Chief Complaint  Patient presents with  . red spot on L forearm    HPI: The patient is a 73 y.o. male who presents today for color change on his left forearm. He states a couple days ago he noticed a red area on his left forearm. He just went back to work on Tuesday of this week and isn't sure if he bumped something. He wanted to make sure it was okay because he doesn't want it to be the virus. Area of color is completely asymptomatic. He is only on blood pressure medication. No ASA or other blood thinner. Area has not changed in size, not tender and not itchy. No other skin changes on body.   Review of Systems  Constitutional: Negative for fatigue.  Respiratory: Negative for shortness of breath.   Cardiovascular: Negative for chest pain.  Gastrointestinal: Negative for abdominal pain and nausea.  Skin: Positive for color change.       Reddish-purple on left forearm.  Denies pain/itching  Neurological: Negative for dizziness and headaches.    Allergies Patient has No Known Allergies.  Past Medical History Patient  has a past medical history of GERD (gastroesophageal reflux disease), Hypertension, PARESTHESIA (03/16/2010), Right acetabular fracture ([place]) (09/15/2012), and Unspecified vitamin D deficiency (09/14/2012).  Surgical History Patient  has a past surgical history that includes Tonsillectomy; External fixation leg (Right, 09/05/2012); and ORIF acetabular fracture (Right, 09/08/2012).  Family History Pateint's family history is not on file.  Social History Patient  reports that he has never smoked. He has never used smokeless tobacco. He reports that he does not drink alcohol or use drugs.    Objective: Vitals:   12/04/18 1046  BP: 136/80  Pulse: 88  Temp: 98.1 F (36.7 C)  TempSrc: Oral  SpO2: 96%  Weight: 158 lb (71.7 kg)  Height: 5\' 6"  (1.676 m)    Body mass index is  25.5 kg/m.  Physical Exam Vitals signs reviewed.  Constitutional:      Appearance: Normal appearance.  HENT:     Head: Normocephalic and atraumatic.  Neck:     Musculoskeletal: Normal range of motion and neck supple.  Cardiovascular:     Rate and Rhythm: Normal rate and regular rhythm.     Heart sounds: Normal heart sounds.  Pulmonary:     Effort: Pulmonary effort is normal.     Breath sounds: Normal breath sounds.  Abdominal:     General: Abdomen is flat. Bowel sounds are normal.     Palpations: Abdomen is soft.  Skin:    Findings: Lesion (2 cm erythematous/purple superficial bruise on arm. slight yellow surrounding. non tender to palpation, not scaly/itchy. ) present.  Neurological:     Mental Status: He is alert.        Assessment/plan: 1. Superficial bruising of arm, left, initial encounter Reassurance given. Discussed it will go away and nothing to do. If starts to get more bruising or does not go away, can follow up with dr. [name] for lab work up.   Return if symptoms worsen or fail to improve.    Orma Flaming, MD [place]   12/04/2018

## 2018-12-04 NOTE — Patient Instructions (Signed)
You have a superficial bruise on your arm. It will go away over time. Blood will slowly get absorbed back into your skin. If does not resolve in 2 weeks, please let Dr. [name] know or if you start getting multiple bruises we need to do labs on you.   So nice to meet you!  Dr. Rogers Blocker

## 2018-12-07 DIAGNOSIS — R69 Illness, unspecified: Secondary | ICD-10-CM | POA: Diagnosis not present

## 2019-01-29 ENCOUNTER — Telehealth: Payer: Self-pay | Admitting: Family Medicine

## 2019-01-29 NOTE — Telephone Encounter (Signed)
-----   Message from Micheline Chapman sent at 01/29/2019 12:19 PM EDT ----- Regarding: Please look into billing questions for pts Both patients Leslie Cox MRN [id number] and his wife Leslie Cox MRN [id number] came into the office on 01/29/19 inquiring about some bills that went to collections. The patients are stating that they never got any bills at all beforehand but are now getting the notices that the bills have gone to collections. I confirmed that their addresses listed are both correct in their chart. They are both a little upset because they dont understand why the bills would have been sent to collections without the patients being notified of the bills first. There are charges for both patients accounts. I made copies of the paperwork they brought in to me and gave them the numbers for billing and pt financial services, as well as let them know that I would have you look into this for them as well and contact them once you have. Thank you!!

## 2019-01-29 NOTE — Telephone Encounter (Signed)
I have emailed charge corrections to help look into this and assist. No need to route, for documentation purposes only.

## 2019-02-10 NOTE — Telephone Encounter (Signed)
I received an email from the [employer] billing department stating  "they were not sure why they did not receive a bill. The account does reflect that the last statement was generated on 08/27/2018 which was thru the service date of 08/14/2018.The date of service that was in bad debt was 08/21/2018.  [employer] is continuing to look into why they were turned over to collections on 12/21/2018 but no statements were sent to the patients."  I am awaiting the final update before contacting the patient.

## 2019-02-18 ENCOUNTER — Telehealth: Payer: Self-pay | Admitting: Family Medicine

## 2019-02-18 NOTE — Telephone Encounter (Signed)
Called pt to follow up on collections/billing issues. No answer and unable to leave voice mail. I did call and speak to the patients wife though, Leslie Cox.   Spoke with [name] who confirmed she spoke with billing and received the same information that we did, the bills were marked with return to sender. Patient's wife said she has spoken with the post office and is waiting to hear back from them. She also says she still has all the numbers for billing should they have any other questions.

## 2019-05-28 DIAGNOSIS — R69 Illness, unspecified: Secondary | ICD-10-CM | POA: Diagnosis not present

## 2019-05-28 DIAGNOSIS — Z125 Encounter for screening for malignant neoplasm of prostate: Secondary | ICD-10-CM | POA: Diagnosis not present

## 2019-05-28 DIAGNOSIS — Z7689 Persons encountering health services in other specified circumstances: Secondary | ICD-10-CM | POA: Diagnosis not present

## 2019-05-28 DIAGNOSIS — I10 Essential (primary) hypertension: Secondary | ICD-10-CM | POA: Diagnosis not present

## 2019-05-28 DIAGNOSIS — Z23 Encounter for immunization: Secondary | ICD-10-CM | POA: Diagnosis not present

## 2019-05-28 DIAGNOSIS — Z1331 Encounter for screening for depression: Secondary | ICD-10-CM | POA: Diagnosis not present

## 2019-05-28 DIAGNOSIS — R05 Cough: Secondary | ICD-10-CM | POA: Diagnosis not present

## 2019-06-01 DIAGNOSIS — H524 Presbyopia: Secondary | ICD-10-CM | POA: Diagnosis not present

## 2019-06-01 DIAGNOSIS — H5203 Hypermetropia, bilateral: Secondary | ICD-10-CM | POA: Diagnosis not present

## 2019-06-01 DIAGNOSIS — H52223 Regular astigmatism, bilateral: Secondary | ICD-10-CM | POA: Diagnosis not present

## 2019-06-14 DIAGNOSIS — R69 Illness, unspecified: Secondary | ICD-10-CM | POA: Diagnosis not present

## 2019-06-23 NOTE — Telephone Encounter (Signed)
error 

## 2019-08-26 ENCOUNTER — Ambulatory Visit: Payer: Medicare HMO

## 2019-09-17 ENCOUNTER — Ambulatory Visit: Payer: Medicare Other | Attending: Internal Medicine

## 2019-09-17 DIAGNOSIS — Z23 Encounter for immunization: Secondary | ICD-10-CM

## 2019-09-17 NOTE — Progress Notes (Signed)
   Covid-19 Vaccination Clinic  Name:  Leslie Cox    MRN: [id number] DOB: 25-Mar-1946  09/17/2019  Mr. Leslie Cox was observed post Covid-19 immunization for 15 minutes without incident. He was provided with Vaccine Information Sheet and instruction to access the V-Safe system.   Mr. Leslie Cox was instructed to call 911 with any severe reactions post vaccine: [name] Difficulty breathing  . Swelling of face and throat  . A fast heartbeat  . A bad rash all over body  . Dizziness and weakness   Immunizations Administered    Name Date Dose VIS Date Route   Pfizer COVID-19 Vaccine 09/17/2019 10:33 AM 0.3 mL 06/11/2019 Intramuscular   Manufacturer: [place]   Lot: [id number]   [place]: [id number]

## 2019-10-11 ENCOUNTER — Ambulatory Visit: Payer: Self-pay

## 2019-10-13 ENCOUNTER — Ambulatory Visit: Payer: Medicare Other | Attending: Internal Medicine

## 2019-10-13 DIAGNOSIS — Z23 Encounter for immunization: Secondary | ICD-10-CM

## 2019-10-13 NOTE — Progress Notes (Signed)
   Covid-19 Vaccination Clinic  Name:  Leslie Cox    MRN: [id number] DOB: Nov 13, 1945  10/13/2019  [name] was observed post Covid-19 immunization for 15 minutes without incident. He was provided with Vaccine Information Sheet and instruction to access the V-Safe system.   [name] was instructed to call 911 with any severe reactions post vaccine: [name] Difficulty breathing  . Swelling of face and throat  . A fast heartbeat  . A bad rash all over body  . Dizziness and weakness   Immunizations Administered    Name Date Dose VIS Date Route   Pfizer COVID-19 Vaccine 10/13/2019  8:21 AM 0.3 mL 06/11/2019 Intramuscular   Manufacturer: [place]   Lot: [id number]   [place]: [id number]

## 2019-10-25 ENCOUNTER — Other Ambulatory Visit: Payer: Self-pay | Admitting: Family Medicine

## 2020-01-06 IMAGING — CT CT ABDOMEN AND PELVIS WITHOUT CONTRAST
2 of 4 series · 15 of 46 positions shown, 17 images · non-contrast
Comparison: 01/05/2016

CLINICAL DATA: Right lower quadrant abdominal pain. No fever.
Duration of symptoms 1 month. Previous hernia surgery. Previous
pelvic fracture and reconstruction.

EXAM:
CT ABDOMEN AND PELVIS WITHOUT CONTRAST
TECHNIQUE: Multidetector CT imaging of the abdomen and pelvis was performed
following the standard protocol without IV contrast.

[Series 2: routine abdomen pelvis without 5.00 br40 s3 ax · axial · non-contrast · 0.67mm/px · z∈[+1254,+1664]mm · 12 of 90 slices shown, 14 images]
[im 4/90  soft-tissue]
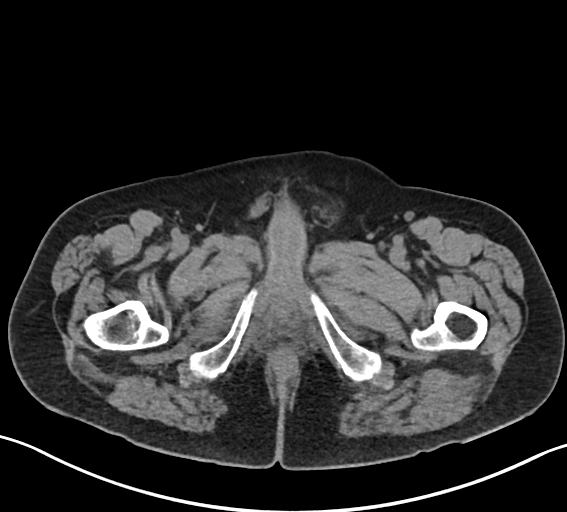
[im 4/90  bone]
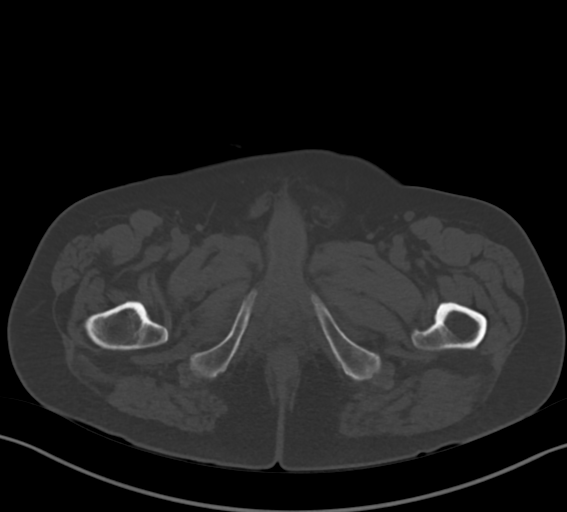
[im 12/90  soft-tissue]
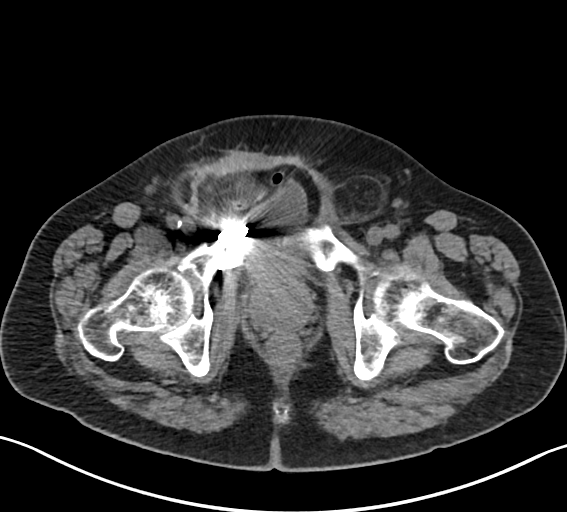
[im 19/90  soft-tissue]
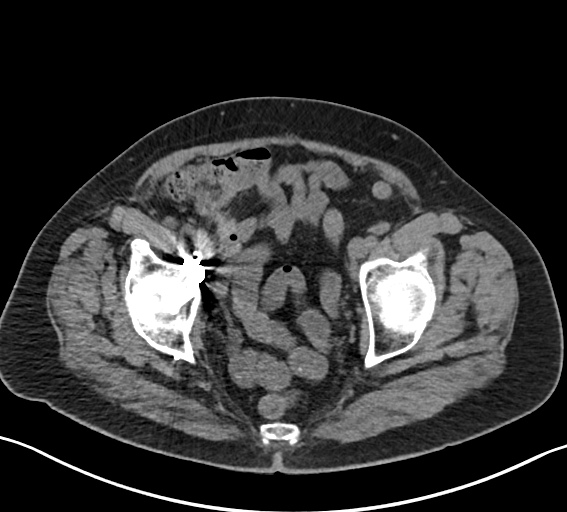
[im 26/90  soft-tissue]
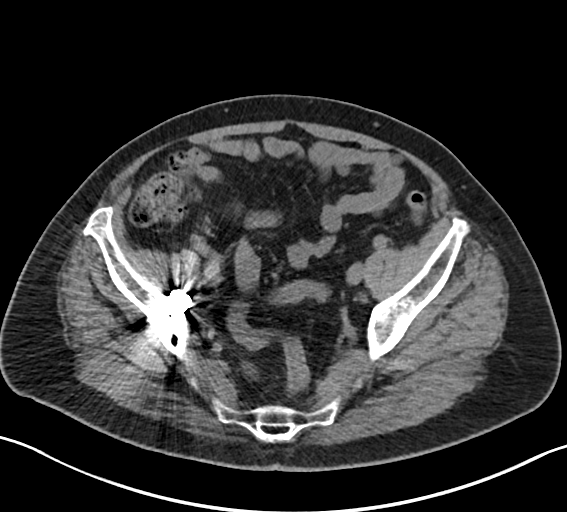
[im 34/90  soft-tissue]
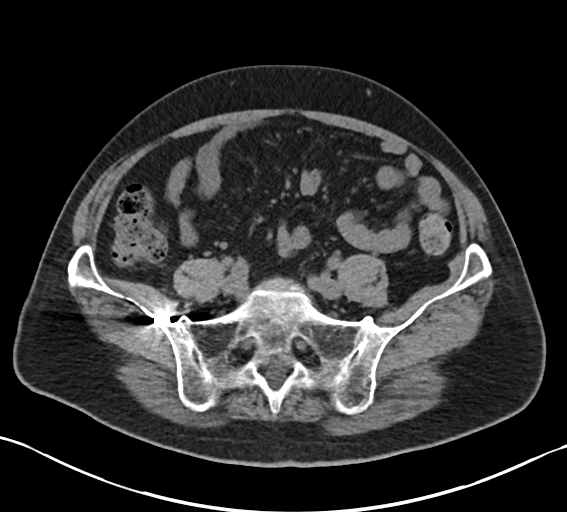
[im 41/90  soft-tissue]
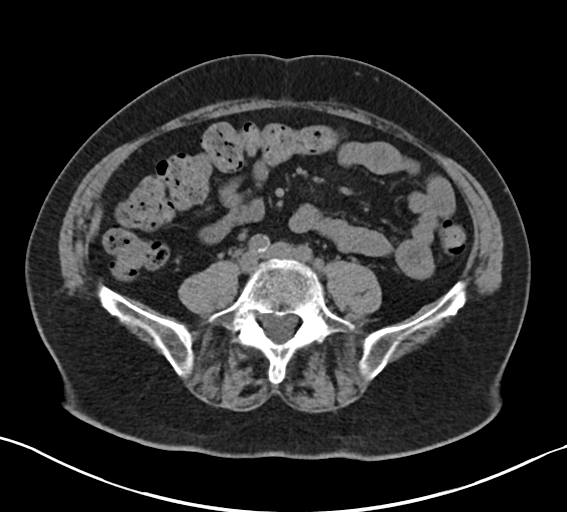
[im 49/90  soft-tissue]
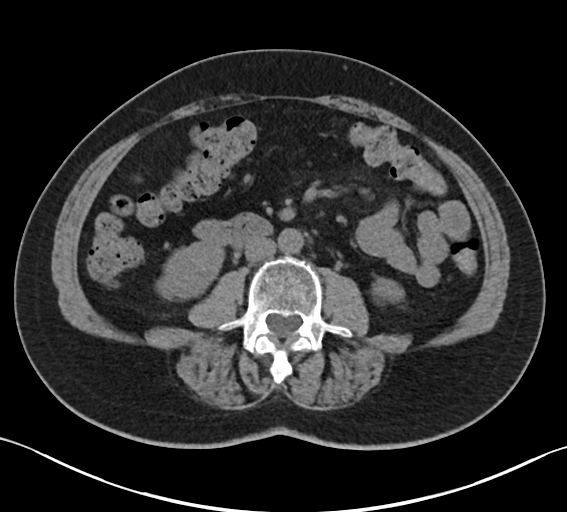
[im 56/90  soft-tissue]
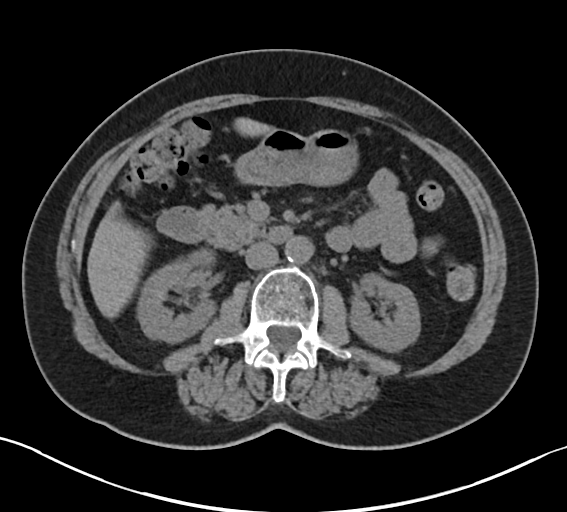
[im 64/90  soft-tissue]
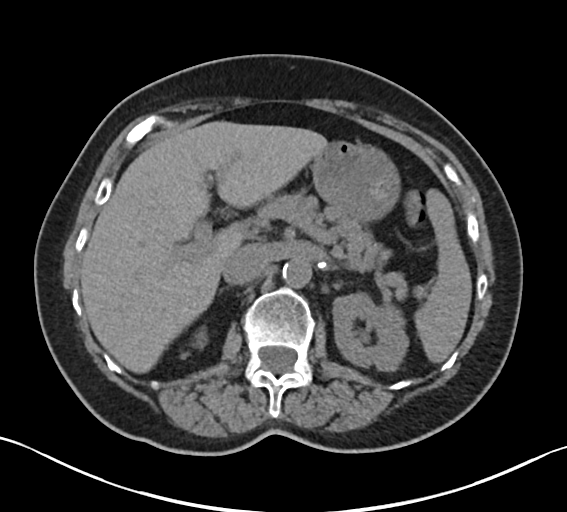
[im 64/90  bone]
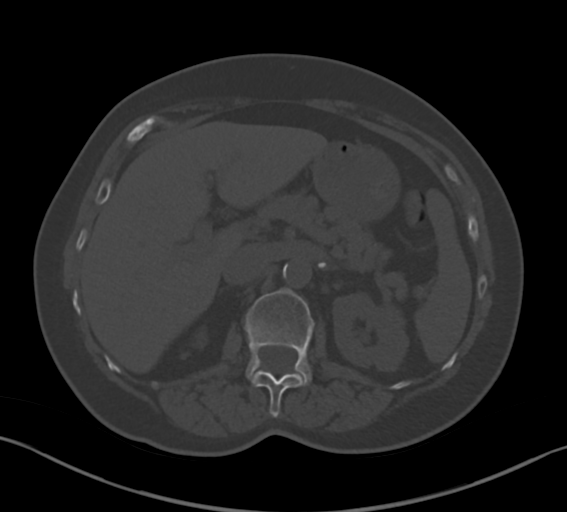
[im 71/90  soft-tissue]
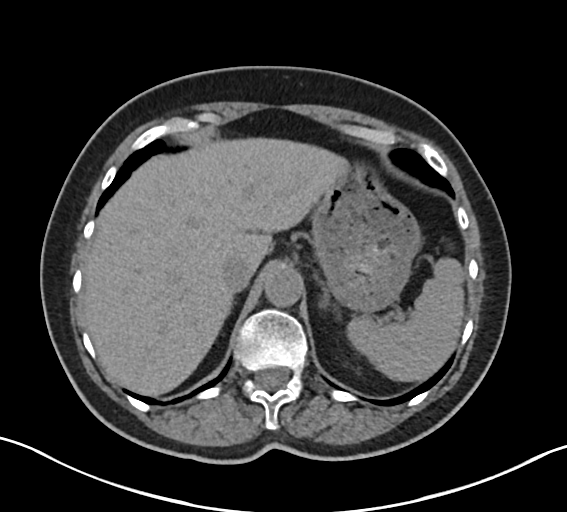
[im 78/90  soft-tissue]
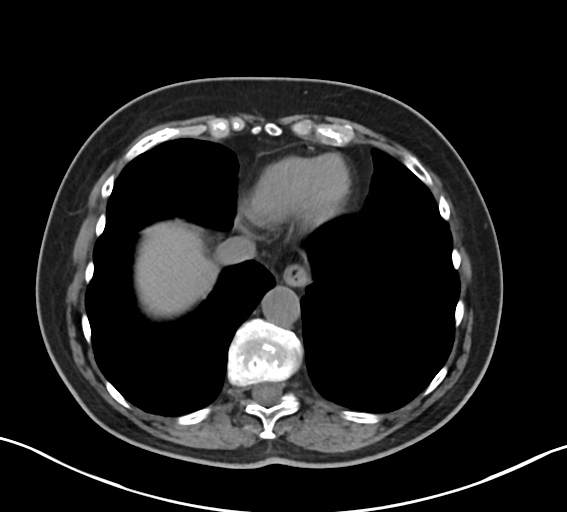
[im 86/90  soft-tissue]
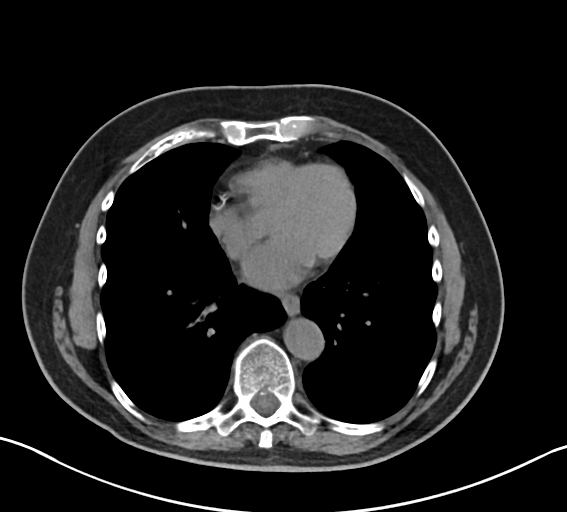

[Series 4: routine abdomen pelvis without 2.00 br40 s3 cor · coronal · non-contrast · 0.74mm/px · 3 of 167 slices shown]
[im 56/167  soft-tissue]
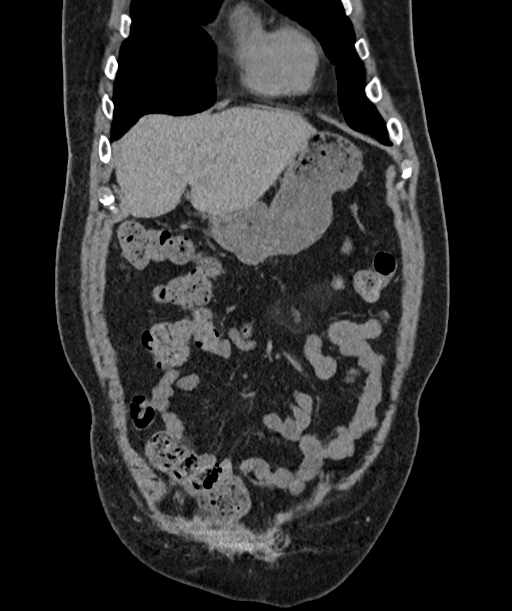
[im 74/167  soft-tissue]
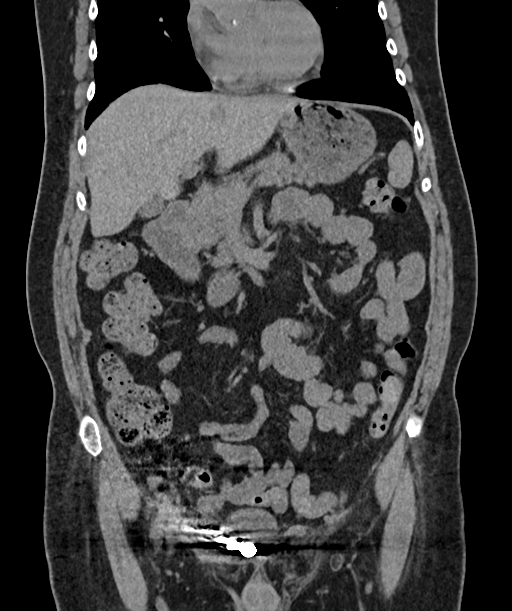
[im 93/167  soft-tissue]
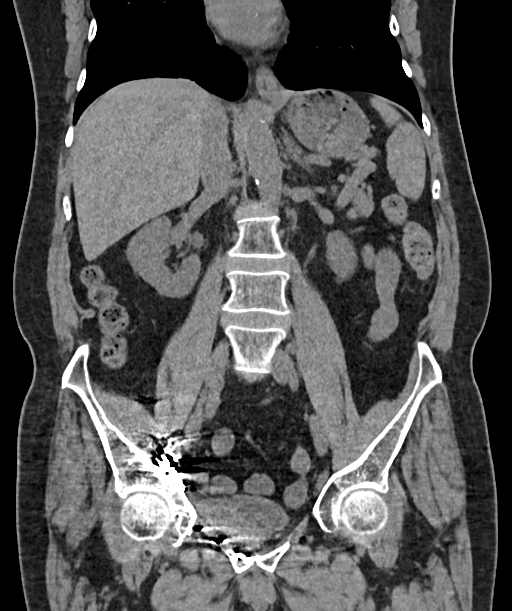

[15 of 46 positions shown; findings below may reference images not displayed]

FINDINGS: Lower chest: Normal

Hepatobiliary: Normal

Pancreas: Normal

Spleen: Normal

Adrenals/Urinary Tract: Adrenal glands are normal. Kidneys are
normal without cyst, mass, stone or hydronephrosis. Bladder appears
normal.

Stomach/Bowel: No acute or significant bowel finding.

Vascular/Lymphatic: Aortic atherosclerosis. No aneurysm. IVC is
normal. No adenopathy.

Reproductive: Normal

Other: No free fluid or air.

Musculoskeletal: Patient has had previous right-sided pelvic
reconstruction with plate and screw along the medial margin of the
iliac bone, the medial anterior acetabulum and the superior pubic
ramus. No complications seen relative to that reconstruction.

There is a left inguinal hernia containing only fat. Lower abdominal
wall musculature on the right bulges outward. It appears that there
may have been previous mesh surgery in that region. Despite the
bulge, no true herniation is identified. Scarring in this region
could be associated with pain.
IMPRESSION: Previous right-sided pelvic reconstructive surgery without visible
bone or hardware complication.

Left inguinal hernia containing only fat.

Right lower anterior abdominal wall musculature bulges outward, and
there is scarring, probably related to the previous surgical
approach. No true hernia. Scarring in this region could be
symptomatic.

## 2020-04-14 ENCOUNTER — Ambulatory Visit: Payer: Medicare Other | Admitting: Family Medicine

## 2020-08-18 DIAGNOSIS — H906 Mixed conductive and sensorineural hearing loss, bilateral: Secondary | ICD-10-CM | POA: Diagnosis not present

## 2020-08-22 DIAGNOSIS — H04129 Dry eye syndrome of unspecified lacrimal gland: Secondary | ICD-10-CM | POA: Diagnosis not present

## 2020-11-16 ENCOUNTER — Ambulatory Visit (HOSPITAL_COMMUNITY)
Admission: EM | Admit: 2020-11-16 | Discharge: 2020-11-16 | Disposition: A | Discharge status: Home / Self Care | Payer: Medicare Other | Attending: Internal Medicine | Admitting: Internal Medicine

## 2020-11-16 ENCOUNTER — Encounter (HOSPITAL_COMMUNITY): Payer: Self-pay

## 2020-11-16 ENCOUNTER — Other Ambulatory Visit: Payer: Self-pay

## 2020-11-16 DIAGNOSIS — M461 Sacroiliitis, not elsewhere classified: Secondary | ICD-10-CM | POA: Diagnosis not present

## 2020-11-16 MED ORDER — PREDNISONE 20 MG PO TABS: 20.0000 mg | ORAL_TABLET | Freq: Every day | ORAL | 0 refills | Status: AC

## 2020-11-16 MED ORDER — METHOCARBAMOL 500 MG PO TABS: 500.0000 mg | ORAL_TABLET | Freq: Every evening | ORAL | 0 refills | Status: DC | PRN

## 2020-11-16 NOTE — ED Triage Notes (Signed)
Pt reports lower back pain and right leg pain x 5 days. Pain is worse when standing and bending over.

## 2020-11-16 NOTE — Discharge Instructions (Addendum)
Gentle range of motion exercises Take medications as directed If you have worsening pain, pain radiating into the lower extremities, weakness in both lower extremities or either of the lower extremities please return to the urgent care to be reevaluated If you have difficulty with voiding or having bowel movements please return to urgent care to be reevaluated.

## 2020-11-20 NOTE — ED Provider Notes (Addendum)
[place]    CSN: [id number] Arrival date & time: 11/16/20  0940      History   Chief Complaint Chief Complaint  Patient presents with  . Back Pain    HPI Leslie Cox is a 75 y.o. male comes to the urgent care with complaints of lower back pain and right leg pain of 5 days duration.  Onset was fairly insidious and has been progressive.  Pain is currently of moderate severity, aggravated by movement and relieved by standing.  No bowel/urinary difficulties.  No fall or trauma.  Pain radiating to the right leg.  Patient works at [employer].  He denies any heavy lifting.  He has seen chiropractors in the past for the low back pain with no improvement in her symptoms.  No fever or chills.  No dysuria, urgency or frequency. Muscle stiffness in the lower back. HPI  Past Medical History:  Diagnosis Date  . GERD (gastroesophageal reflux disease)   . Hypertension   . PARESTHESIA 03/16/2010   Qualifier: Diagnosis of  By: Burnice Logan  MD, Doretha Sou   . Right acetabular fracture ([place]) 09/15/2012  . Unspecified vitamin D deficiency 09/14/2012    Patient Active Problem List   Diagnosis Date Noted  . Bilateral shoulder pain 08/21/2018  . Abnormal x-ray 07/14/2018  . Right inguinal hernia 02/23/2014  . Hypertension 10/02/2012  . Acid reflux 09/07/2012    Past Surgical History:  Procedure Laterality Date  . EXTERNAL FIXATION LEG Right 09/05/2012   Procedure: Application of traction bow externally;  Surgeon: Mauri Pole, MD;  Location: [place];  Service: Orthopedics;  Laterality: Right;  . ORIF ACETABULAR FRACTURE Right 09/08/2012   Procedure: OPEN REDUCTION INTERNAL FIXATION (ORIF) ACETABULAR FRACTURE;  Surgeon: Rozanna Box, MD;  Location: [place];  Service: Orthopedics;  Laterality: Right;  . TONSILLECTOMY         Home Medications    Prior to Admission medications   Medication Sig Start Date End Date Taking? Authorizing Provider  methocarbamol (ROBAXIN) 500 MG tablet Take  1 tablet (500 mg total) by mouth at bedtime as needed for muscle spasms. 11/16/20  Yes Chenelle Benning, Myrene Galas, MD  predniSONE (DELTASONE) 20 MG tablet Take 1 tablet (20 mg total) by mouth daily for 5 days. 11/16/20 11/21/20 Yes Joandy Burget, Myrene Galas, MD  lisinopril-hydrochlorothiazide (ZESTORETIC) 20-12.5 MG tablet TAKE 1 TABLET BY MOUTH EVERY DAY 10/25/19   [name], MD    Family History Family History  Problem Relation Age of Onset  . Cancer Neg Hx     Social History Social History   Tobacco Use  . Smoking status: Never Smoker  . Smokeless tobacco: Never Used  Substance Use Topics  . Alcohol use: No  . Drug use: No     Allergies   Patient has no known allergies.   Review of Systems Review of Systems  Musculoskeletal: Positive for back pain. Negative for arthralgias, joint swelling, myalgias, neck pain and neck stiffness.  Skin: Negative.   Neurological: Negative.      Physical Exam Triage Vital Signs ED Triage Vitals  Enc Vitals Group     BP 11/16/20 1043 (!) 149/81     Pulse Rate 11/16/20 1043 83     Resp 11/16/20 1043 18     Temp 11/16/20 1043 98 F (36.7 C)     Temp Source 11/16/20 1043 Oral     SpO2 11/16/20 1043 94 %     Weight --  Height --      Head Circumference --      Peak Flow --      Pain Score 11/16/20 1042 9     Pain Loc --      Pain Edu? --      Excl. in [place]? --    No data found.  Updated Vital Signs BP (!) 149/81 (BP Location: Right Arm)   Pulse 83   Temp 98 F (36.7 C) (Oral)   Resp 18   SpO2 94%   Visual Acuity Right Eye Distance:   Left Eye Distance:   Bilateral Distance:    Right Eye Near:   Left Eye Near:    Bilateral Near:     Physical Exam Vitals and nursing note reviewed.  Constitutional:      General: He is not in acute distress.    Appearance: He is not ill-appearing.  Cardiovascular:     Rate and Rhythm: Normal rate and regular rhythm.  Pulmonary:     Effort: Pulmonary effort is normal.     Breath sounds:  Normal breath sounds.  Abdominal:     General: Bowel sounds are normal.     Palpations: Abdomen is soft.  Musculoskeletal:        General: Normal range of motion.     Comments: Tenderness over the right sacroiliac joint.  Deep tendon reflexes are 5 out of 5 in the patella Power is 5 out of 5 in the lower extremities.  Neurological:     Mental Status: He is alert.      UC Treatments / Results  Labs (all labs ordered are listed, but only abnormal results are displayed) Labs Reviewed - No data to display  EKG   Radiology No results found.  Procedures Procedures (including critical care time)  Medications Ordered in UC Medications - No data to display  Initial Impression / Assessment and Plan / UC Course  I have reviewed the triage vital signs and the nursing notes.  Pertinent labs & imaging results that were available during my care of the patient were reviewed by me and considered in my medical decision making (see chart for details).     1.  Sacroiliitis: Gentle range of motion exercises Patient has tried ibuprofen with no improvement in symptoms and hence we will try a short course of steroids. Heating pad use will help alleviate the pain and discomfort Robaxin as needed for muscle spasm Return to urgent care if symptoms worsen. Final Clinical Impressions(s) / UC Diagnoses   Final diagnoses:  Sacroiliitis ([place])     Discharge Instructions     Gentle range of motion exercises Take medications as directed If you have worsening pain, pain radiating into the lower extremities, weakness in both lower extremities or either of the lower extremities please return to the urgent care to be reevaluated If you have difficulty with voiding or having bowel movements please return to urgent care to be reevaluated.   ED Prescriptions    Medication Sig Dispense Auth. Provider   predniSONE (DELTASONE) 20 MG tablet Take 1 tablet (20 mg total) by mouth daily for 5 days. 5  tablet Sonna Lipsky, Myrene Galas, MD   methocarbamol (ROBAXIN) 500 MG tablet Take 1 tablet (500 mg total) by mouth at bedtime as needed for muscle spasms. 20 tablet Curren Mohrmann, Myrene Galas, MD     PDMP not reviewed this encounter.   Chase Picket, MD 11/20/20 3557    Chase Picket, MD 11/20/20 [id number]

## 2020-11-29 DIAGNOSIS — M5440 Lumbago with sciatica, unspecified side: Secondary | ICD-10-CM | POA: Diagnosis not present

## 2020-12-19 DIAGNOSIS — R7301 Impaired fasting glucose: Secondary | ICD-10-CM | POA: Diagnosis not present

## 2020-12-19 DIAGNOSIS — E7800 Pure hypercholesterolemia, unspecified: Secondary | ICD-10-CM | POA: Diagnosis not present

## 2020-12-19 DIAGNOSIS — I10 Essential (primary) hypertension: Secondary | ICD-10-CM | POA: Diagnosis not present

## 2021-02-26 ENCOUNTER — Telehealth: Payer: Self-pay

## 2021-02-27 ENCOUNTER — Telehealth: Payer: Self-pay

## 2021-02-27 NOTE — Telephone Encounter (Signed)
Okay to schedule new pt appointment--also put ok'd by Jerilee Hoh on 02/27/21 in the notes.

## 2021-02-28 NOTE — Telephone Encounter (Signed)
Scheduled 03/08/21

## 2021-03-07 ENCOUNTER — Other Ambulatory Visit: Payer: Self-pay

## 2021-03-08 ENCOUNTER — Encounter: Payer: Self-pay | Admitting: Internal Medicine

## 2021-03-08 ENCOUNTER — Ambulatory Visit (INDEPENDENT_AMBULATORY_CARE_PROVIDER_SITE_OTHER): Payer: Medicare Other | Admitting: Internal Medicine

## 2021-03-08 VITALS — BP 120/84 | HR 80 | Temp 97.6°F | Ht 65.0 in | Wt 153.3 lb

## 2021-03-08 DIAGNOSIS — Z23 Encounter for immunization: Secondary | ICD-10-CM

## 2021-03-08 DIAGNOSIS — I10 Essential (primary) hypertension: Secondary | ICD-10-CM

## 2021-03-08 NOTE — Addendum Note (Signed)
Addended by: Westley Hummer B on: 03/08/2021 05:10 PM   Modules accepted: Orders

## 2021-03-08 NOTE — Progress Notes (Signed)
New Patient Office Visit     This visit occurred during the SARS-CoV-2 public health emergency.  Safety protocols were in place, including screening questions prior to the visit, additional usage of staff PPE, and extensive cleaning of exam room while observing appropriate contact time as indicated for disinfecting solutions.    CC/Reason for Visit: Establish care, discuss chronic conditions, medication refills, flu vaccine Previous PCP: Dimas Chyle, MD Last Visit: February 2020  HPI: Leslie Cox is a 75 y.o. male who is coming in today for the above mentioned reasons. Past Medical History is significant for: Hypertension, GERD.  He is requesting refills of lisinopril/hydrochlorothiazide.  He is requesting a flu vaccine today.  He is also overdue for COVID booster and a Tdap.  He had a colonoscopy in 2019.  He does not smoke, he drinks alcohol only occasionally, he has no known drug allergies.  His past surgical history is only significant for right inguinal hernia.  There is a diagnosis of autism spectrum disorder on his problem list as well.  He has no acute concerns today.   Past Medical/Surgical History: Past Medical History:  Diagnosis Date   GERD (gastroesophageal reflux disease)    Hypertension    PARESTHESIA 03/16/2010   Qualifier: Diagnosis of  By: Burnice Logan  MD, Doretha Sou    Right acetabular fracture ([place]) 09/15/2012   Unspecified vitamin D deficiency 09/14/2012    Past Surgical History:  Procedure Laterality Date   EXTERNAL FIXATION LEG Right 09/05/2012   Procedure: Application of traction bow externally;  Surgeon: Mauri Pole, MD;  Location: [place];  Service: Orthopedics;  Laterality: Right;   ORIF ACETABULAR FRACTURE Right 09/08/2012   Procedure: OPEN REDUCTION INTERNAL FIXATION (ORIF) ACETABULAR FRACTURE;  Surgeon: Rozanna Box, MD;  Location: [place];  Service: Orthopedics;  Laterality: Right;   TONSILLECTOMY      Social History:  reports that he has never  smoked. He has never used smokeless tobacco. He reports that he does not drink alcohol and does not use drugs.  Allergies: No Known Allergies  Family History:  Family History  Problem Relation Age of Onset   Cancer Neg Hx      Current Outpatient Medications:    lisinopril-hydrochlorothiazide (ZESTORETIC) 20-12.5 MG tablet, TAKE 1 TABLET BY MOUTH EVERY DAY, Disp: 90 tablet, Rfl: 2  Review of Systems:  Constitutional: Denies fever, chills, diaphoresis, appetite change and fatigue.  HEENT: Denies photophobia, eye pain, redness, hearing loss, ear pain, congestion, sore throat, rhinorrhea, sneezing, mouth sores, trouble swallowing, neck pain, neck stiffness and tinnitus.   Respiratory: Denies SOB, DOE, cough, chest tightness,  and wheezing.   Cardiovascular: Denies chest pain, palpitations and leg swelling.  Gastrointestinal: Denies nausea, vomiting, abdominal pain, diarrhea, constipation, blood in stool and abdominal distention.  Genitourinary: Denies dysuria, urgency, frequency, hematuria, flank pain and difficulty urinating.  Endocrine: Denies: hot or cold intolerance, sweats, changes in hair or nails, polyuria, polydipsia. Musculoskeletal: Denies myalgias, back pain, joint swelling, arthralgias and gait problem.  Skin: Denies pallor, rash and wound.  Neurological: Denies dizziness, seizures, syncope, weakness, light-headedness, numbness and headaches.  Hematological: Denies adenopathy. Easy bruising, personal or family bleeding history  Psychiatric/Behavioral: Denies suicidal ideation, mood changes, confusion, nervousness, sleep disturbance and agitation    Physical Exam: Vitals:   03/08/21 1527  BP: 120/84  Pulse: 80  Temp: 97.6 F (36.4 C)  TempSrc: Oral  SpO2: 96%  Weight: 153 lb 4.8 oz (69.5 kg)  Height: '5\' 5"'$  (1.651 m)  Body mass index is 25.51 kg/m.  Constitutional: NAD, calm, comfortable Eyes: PERRL, lids and conjunctivae normal, wears corrective lenses ENMT:  Mucous membranes are moist.  Respiratory: clear to auscultation bilaterally, no wheezing, no crackles. Normal respiratory effort. No accessory muscle use.  Cardiovascular: Regular rate and rhythm, no murmurs / rubs / gallops. No extremity edema.  Psychiatric: Normal judgment and insight. Alert and oriented x 3. Normal mood.    Impression and Plan:  Primary hypertension -Blood pressure is well controlled. -Refill for lisinopril 20/hydrochlorothiazide 12.5 will be sent today.  Need for influenza vaccination -Flu vaccine administered today.  Time spent 22 minutes reviewing chart, interviewing and examining patient and formulating plan of care.     Lelon Frohlich, MD [facility] at [facility]

## 2021-05-04 ENCOUNTER — Other Ambulatory Visit: Payer: Self-pay

## 2021-05-04 ENCOUNTER — Encounter: Payer: Self-pay | Admitting: Internal Medicine

## 2021-05-04 ENCOUNTER — Ambulatory Visit (INDEPENDENT_AMBULATORY_CARE_PROVIDER_SITE_OTHER): Payer: Medicare Other | Admitting: Internal Medicine

## 2021-05-04 VITALS — BP 120/84 | HR 87 | Temp 97.7°F | Wt 153.8 lb

## 2021-05-04 DIAGNOSIS — K4090 Unilateral inguinal hernia, without obstruction or gangrene, not specified as recurrent: Secondary | ICD-10-CM

## 2021-05-04 DIAGNOSIS — R3911 Hesitancy of micturition: Secondary | ICD-10-CM | POA: Diagnosis not present

## 2021-05-04 DIAGNOSIS — N401 Enlarged prostate with lower urinary tract symptoms: Secondary | ICD-10-CM | POA: Diagnosis not present

## 2021-05-04 DIAGNOSIS — K219 Gastro-esophageal reflux disease without esophagitis: Secondary | ICD-10-CM | POA: Diagnosis not present

## 2021-05-04 MED ORDER — TAMSULOSIN HCL 0.4 MG PO CAPS: 0.4000 mg | ORAL_CAPSULE | Freq: Every day | ORAL | 1 refills | Status: DC

## 2021-05-04 MED ORDER — PANTOPRAZOLE SODIUM 40 MG PO TBEC: 40.0000 mg | DELAYED_RELEASE_TABLET | Freq: Every day | ORAL | 1 refills | Status: DC

## 2021-05-04 NOTE — Patient Instructions (Signed)
-  Nice seeing you today!!  -Start protonix 40 mg daily for the acid reflux.  -Start flomax 0.4 mg at bedtime for your urine issues.  -We will send you back to see Dr. Rosendo Gros to evaluate your hernia.

## 2021-05-04 NOTE — Progress Notes (Signed)
Acute office Visit     This visit occurred during the SARS-CoV-2 public health emergency.  Safety protocols were in place, including screening questions prior to the visit, additional usage of staff PPE, and extensive cleaning of exam room while observing appropriate contact time as indicated for disinfecting solutions.    CC/Reason for Visit: Discuss some acute concerns  HPI: Leslie Cox is a 75 y.o. male who is coming in today for the above mentioned reasons.  He has some acute concerns that he would like to discuss today:  1.  He has been experiencing burning in his throat that is worse when he lies down, sour taste in his mouth and belching.  He is also been having excess flatulence.  2.  He has been having increased urinary frequency, nocturia, urinary hesitancy.  3.  He had a right inguinal hernia repair by Dr. Rosendo Gros years ago.  He has been having burning pain at that site and feels like the hernia might have "popped out".  Past Medical/Surgical History: Past Medical History:  Diagnosis Date   GERD (gastroesophageal reflux disease)    Hypertension    PARESTHESIA 03/16/2010   Qualifier: Diagnosis of  By: Burnice Logan  MD, Doretha Sou    Right acetabular fracture ([place]) 09/15/2012   Unspecified vitamin D deficiency 09/14/2012    Past Surgical History:  Procedure Laterality Date   EXTERNAL FIXATION LEG Right 09/05/2012   Procedure: Application of traction bow externally;  Surgeon: Mauri Pole, MD;  Location: [place];  Service: Orthopedics;  Laterality: Right;   ORIF ACETABULAR FRACTURE Right 09/08/2012   Procedure: OPEN REDUCTION INTERNAL FIXATION (ORIF) ACETABULAR FRACTURE;  Surgeon: Rozanna Box, MD;  Location: [place];  Service: Orthopedics;  Laterality: Right;   TONSILLECTOMY      Social History:  reports that he has never smoked. He has never used smokeless tobacco. He reports that he does not drink alcohol and does not use drugs.  Allergies: No Known  Allergies  Family History:  Family History  Problem Relation Age of Onset   Cancer Neg Hx      Current Outpatient Medications:    lisinopril-hydrochlorothiazide (ZESTORETIC) 20-12.5 MG tablet, TAKE 1 TABLET BY MOUTH EVERY DAY, Disp: 90 tablet, Rfl: 2   Multiple Vitamin (MULTI VITAMIN) TABS, 1 tablet, Disp: , Rfl:    pantoprazole (PROTONIX) 40 MG tablet, Take 1 tablet (40 mg total) by mouth daily., Disp: 90 tablet, Rfl: 1   tamsulosin (FLOMAX) 0.4 MG CAPS capsule, Take 1 capsule (0.4 mg total) by mouth daily., Disp: 90 capsule, Rfl: 1  Review of Systems:  Constitutional: Denies fever, chills, diaphoresis, appetite change and fatigue.  HEENT: Denies photophobia, eye pain, redness, hearing loss, ear pain, congestion, sore throat, rhinorrhea, sneezing, mouth sores, trouble swallowing, neck pain, neck stiffness and tinnitus.   Respiratory: Denies SOB, DOE, cough, chest tightness,  and wheezing.   Cardiovascular: Denies chest pain, palpitations and leg swelling.  Gastrointestinal: Denies nausea, vomiting, abdominal pain, diarrhea, constipation, blood in stool and abdominal distention.  Genitourinary: Denies dysuria, urgency, frequency, hematuria, flank pain and difficulty urinating.  Endocrine: Denies: hot or cold intolerance, sweats, changes in hair or nails, polyuria, polydipsia. Musculoskeletal: Denies myalgias, back pain, joint swelling, arthralgias and gait problem.  Skin: Denies pallor, rash and wound.  Neurological: Denies dizziness, seizures, syncope, weakness, light-headedness, numbness and headaches.  Hematological: Denies adenopathy. Easy bruising, personal or family bleeding history  Psychiatric/Behavioral: Denies suicidal ideation, mood changes, confusion, nervousness, sleep disturbance and  agitation    Physical Exam: Vitals:   05/04/21 0657  BP: 120/84  Pulse: 87  Temp: 97.7 F (36.5 C)  TempSrc: Oral  SpO2: 98%  Weight: 153 lb 12.8 oz (69.8 kg)    Body mass index  is 25.59 kg/m.   Constitutional: NAD, calm, comfortable Eyes: PERRL, lids and conjunctivae normal ENMT: Mucous membranes are moist.  Respiratory: clear to auscultation bilaterally, no wheezing, no crackles. Normal respiratory effort. No accessory muscle use.  Cardiovascular: Regular rate and rhythm, no murmurs / rubs / gallops. No extremity edema.   Abdomen: With Valsalva maneuvers such as coughing and bearing down I do feel a slight prominence at the right inguinal area which correlates to his area of pain. Neurologic: Grossly intact and nonfocal Psychiatric: Normal judgment and insight. Alert and oriented x 3. Normal mood.    Impression and Plan:  Right inguinal hernia  - Plan: Ambulatory referral to General Surgery for evaluation.  Gastroesophageal reflux disease, unspecified whether esophagitis present  - Plan: pantoprazole (PROTONIX) 40 MG tablet  Benign prostatic hyperplasia with urinary hesitancy  - Plan: tamsulosin (FLOMAX) 0.4 MG CAPS capsule  Time spent: 31 minutes reviewing chart, interviewing and examining patient and formulating plan of care.   Patient Instructions  -Nice seeing you today!!  -Start protonix 40 mg daily for the acid reflux.  -Start flomax 0.4 mg at bedtime for your urine issues.  -We will send you back to see Dr. Rosendo Gros to evaluate your hernia.    Lelon Frohlich, MD [facility] at [facility]

## 2021-06-08 ENCOUNTER — Ambulatory Visit (INDEPENDENT_AMBULATORY_CARE_PROVIDER_SITE_OTHER): Payer: Medicare Other | Admitting: Internal Medicine

## 2021-06-08 ENCOUNTER — Encounter: Payer: Self-pay | Admitting: Internal Medicine

## 2021-06-08 VITALS — BP 120/78 | HR 88 | Temp 97.9°F | Ht 67.0 in | Wt 154.7 lb

## 2021-06-08 DIAGNOSIS — K219 Gastro-esophageal reflux disease without esophagitis: Secondary | ICD-10-CM

## 2021-06-08 DIAGNOSIS — I10 Essential (primary) hypertension: Secondary | ICD-10-CM | POA: Diagnosis not present

## 2021-06-08 DIAGNOSIS — Z0000 Encounter for general adult medical examination without abnormal findings: Secondary | ICD-10-CM

## 2021-06-08 NOTE — Progress Notes (Signed)
Established Patient Office Visit     This visit occurred during the SARS-CoV-2 public health emergency.  Safety protocols were in place, including screening questions prior to the visit, additional usage of staff PPE, and extensive cleaning of exam room while observing appropriate contact time as indicated for disinfecting solutions.    CC/Reason for Visit: Annual preventive exam and subsequent Medicare wellness visit  HPI: Leslie Cox is a 75 y.o. male who is coming in today for the above mentioned reasons. Past Medical History is significant for: Hypertension, hyperlipidemia and GERD.  He has been doing well and has no acute concerns today.  He has routine eye and dental care.  No perceived hearing issues.  He had a colonoscopy in 2019.  He will be seeing general surgery soon for what he believes is a right inguinal hernia.  He is due for Tdap, shingles, COVID booster.   Past Medical/Surgical History: Past Medical History:  Diagnosis Date   GERD (gastroesophageal reflux disease)    Hypertension    PARESTHESIA 03/16/2010   Qualifier: Diagnosis of  By: Burnice Logan  MD, Doretha Sou    Right acetabular fracture ([place]) 09/15/2012   Unspecified vitamin D deficiency 09/14/2012    Past Surgical History:  Procedure Laterality Date   EXTERNAL FIXATION LEG Right 09/05/2012   Procedure: Application of traction bow externally;  Surgeon: Mauri Pole, MD;  Location: [place];  Service: Orthopedics;  Laterality: Right;   ORIF ACETABULAR FRACTURE Right 09/08/2012   Procedure: OPEN REDUCTION INTERNAL FIXATION (ORIF) ACETABULAR FRACTURE;  Surgeon: Rozanna Box, MD;  Location: [place];  Service: Orthopedics;  Laterality: Right;   TONSILLECTOMY      Social History:  reports that he has never smoked. He has never used smokeless tobacco. He reports that he does not drink alcohol and does not use drugs.  Allergies: No Known Allergies  Family History:  Family History  Problem Relation Age of Onset    Cancer Neg Hx      Current Outpatient Medications:    lisinopril-hydrochlorothiazide (ZESTORETIC) 20-12.5 MG tablet, TAKE 1 TABLET BY MOUTH EVERY DAY, Disp: 90 tablet, Rfl: 2   Multiple Vitamin (MULTI VITAMIN) TABS, 1 tablet, Disp: , Rfl:    pantoprazole (PROTONIX) 40 MG tablet, Take 1 tablet (40 mg total) by mouth daily., Disp: 90 tablet, Rfl: 1   tamsulosin (FLOMAX) 0.4 MG CAPS capsule, Take 1 capsule (0.4 mg total) by mouth daily., Disp: 90 capsule, Rfl: 1  Review of Systems:  Constitutional: Denies fever, chills, diaphoresis, appetite change and fatigue.  HEENT: Denies photophobia, eye pain, redness, hearing loss, ear pain, congestion, sore throat, rhinorrhea, sneezing, mouth sores, trouble swallowing, neck pain, neck stiffness and tinnitus.   Respiratory: Denies SOB, DOE, cough, chest tightness,  and wheezing.   Cardiovascular: Denies chest pain, palpitations and leg swelling.  Gastrointestinal: Denies nausea, vomiting, abdominal pain, diarrhea, constipation, blood in stool and abdominal distention.  Genitourinary: Denies dysuria, urgency, frequency, hematuria, flank pain and difficulty urinating.  Endocrine: Denies: hot or cold intolerance, sweats, changes in hair or nails, polyuria, polydipsia. Musculoskeletal: Denies myalgias, back pain, joint swelling, arthralgias and gait problem.  Skin: Denies pallor, rash and wound.  Neurological: Denies dizziness, seizures, syncope, weakness, light-headedness, numbness and headaches.  Hematological: Denies adenopathy. Easy bruising, personal or family bleeding history  Psychiatric/Behavioral: Denies suicidal ideation, mood changes, confusion, nervousness, sleep disturbance and agitation    Physical Exam: Vitals:   06/08/21 0757  BP: 120/78  Pulse: 88  Temp:  97.9 F (36.6 C)  TempSrc: Oral  SpO2: 99%  Weight: 154 lb 11.2 oz (70.2 kg)  Height: 5\' 7"  (1.702 m)    Body mass index is 24.23 kg/m.   Constitutional: NAD, calm,  comfortable Eyes: PERRL, lids and conjunctivae normal, wears corrective lenses ENMT: Mucous membranes are moist. Posterior pharynx clear of any exudate or lesions. Normal dentition. Tympanic membrane is pearly white, no erythema or bulging. Neck: normal, supple, no masses, no thyromegaly Respiratory: clear to auscultation bilaterally, no wheezing, no crackles. Normal respiratory effort. No accessory muscle use.  Cardiovascular: Regular rate and rhythm, no murmurs / rubs / gallops. No extremity edema. 2+ pedal pulses. No carotid bruits.  Abdomen: no tenderness, no masses palpated. No hepatosplenomegaly. Bowel sounds positive.  Musculoskeletal: no clubbing / cyanosis. No joint deformity upper and lower extremities. Good ROM, no contractures. Normal muscle tone.  Skin: no rashes, lesions, ulcers. No induration Neurologic: CN 2-12 grossly intact. Sensation intact, DTR normal. Strength 5/5 in all 4.  Psychiatric: Normal judgment and insight. Alert and oriented x 3. Normal mood.    Subsequent Medicare wellness visit   1. Risk factors, based on past  M,S,F -cardiovascular disease risk factors include age, gender, history of hypertension, history of hyperlipidemia   2.  Physical activities: Just activities of daily living   3.  Depression/mood: Stable, not depressed   4.  Hearing: Wears hearing aids bilaterally   5.  ADL's: Independent in all ADLs   6.  Fall risk: Low fall risk   7.  Home safety: No problems identified   8.  Height weight, and visual acuity: height and weight as above, vision:  Vision Screening   Right eye Left eye Both eyes  Without correction     With correction 20/25 20/25 20/25      9.  Counseling: Advised he update his vaccination status   10. Lab orders based on risk factors: Laboratory update will be reviewed   11. Referral : None today   12. Care plan: Follow-up with me in 6 months   13. Cognitive assessment: He has a mild developmental delay has been  diagnosed with autism.   14. Screening: Patient provided with a written and personalized 5-10 year screening schedule in the AVS. yes   15. Provider List Update: PCP only  16. Advance Directives: Full code   17. Opioids: Patient is not on any opioid prescriptions and has no risk factors for a substance use disorder.   [place] Office Visit from 06/08/2021 in [facility]  PHQ-9 Total Score 2       Fall Risk 10/16/2017 11/16/2020 05/04/2021 06/08/2021 06/08/2021  Falls in the past year? Yes - 0 0 0  Number of falls in past year Patient was putting shower curtain up and fell - - - -  Was there an injury with Fall? Yes - 0 0 0  Fall Risk Category Calculator - - 0 0 0  Fall Risk Category - - Low Low Low  Patient Fall Risk Level - Low fall risk - - -     Impression and Plan:  Encounter for preventive health examination --Recommend routine eye and dental care. -Immunizations: He is overdue for COVID booster, Tdap, shingles which she will obtain at his pharmacy -Healthy lifestyle discussed in detail. -Labs to be updated today. -Colon cancer screening: June/2019 -Breast cancer screening: Not applicable -Cervical cancer screening: Not applicable -Lung cancer screening: Not applicable -Prostate cancer screening: PSA 3.8 in November/2021 -DEXA: Not  applicable  Primary hypertension -Controlled on lisinopril/hydrochlorothiazide  Gastroesophageal reflux disease without esophagitis -Well-controlled on daily PPI therapy.    Patient Instructions  -Nice seeing you today!!  -Remember to get your COVID booster, tdap and shingles vaccines at the pharmacy.  -Schedule follow up in 6 months or sooner as needed.      Lelon Frohlich, MD [facility] at [facility]

## 2021-06-08 NOTE — Patient Instructions (Signed)
-  Nice seeing you today!!  -Remember to get your COVID booster, tdap and shingles vaccines at the pharmacy.  -Schedule follow up in 6 months or sooner as needed.

## 2021-06-11 DIAGNOSIS — R1031 Right lower quadrant pain: Secondary | ICD-10-CM | POA: Diagnosis not present

## 2021-06-13 ENCOUNTER — Other Ambulatory Visit: Payer: Self-pay | Admitting: General Surgery

## 2021-06-13 DIAGNOSIS — R1031 Right lower quadrant pain: Secondary | ICD-10-CM

## 2021-07-10 ENCOUNTER — Ambulatory Visit
Admission: RE | Admit: 2021-07-10 | Discharge: 2021-07-10 | Disposition: A | Discharge status: Home / Self Care | Payer: Medicare Other | Source: Ambulatory Visit | Attending: General Surgery | Admitting: General Surgery

## 2021-07-10 DIAGNOSIS — K4090 Unilateral inguinal hernia, without obstruction or gangrene, not specified as recurrent: Secondary | ICD-10-CM | POA: Diagnosis not present

## 2021-07-10 DIAGNOSIS — I700 Atherosclerosis of aorta: Secondary | ICD-10-CM | POA: Diagnosis not present

## 2021-07-10 DIAGNOSIS — R1031 Right lower quadrant pain: Secondary | ICD-10-CM

## 2021-07-10 DIAGNOSIS — I10 Essential (primary) hypertension: Secondary | ICD-10-CM | POA: Diagnosis not present

## 2021-07-12 DIAGNOSIS — Z9849 Cataract extraction status, unspecified eye: Secondary | ICD-10-CM | POA: Diagnosis not present

## 2021-07-12 DIAGNOSIS — H43813 Vitreous degeneration, bilateral: Secondary | ICD-10-CM | POA: Diagnosis not present

## 2021-07-12 DIAGNOSIS — H43393 Other vitreous opacities, bilateral: Secondary | ICD-10-CM | POA: Diagnosis not present

## 2021-07-12 DIAGNOSIS — Z961 Presence of intraocular lens: Secondary | ICD-10-CM | POA: Diagnosis not present

## 2021-08-21 ENCOUNTER — Other Ambulatory Visit: Payer: Self-pay

## 2021-08-21 ENCOUNTER — Emergency Department (HOSPITAL_BASED_OUTPATIENT_CLINIC_OR_DEPARTMENT_OTHER)
Admission: EM | Admit: 2021-08-21 | Discharge: 2021-08-21 | Disposition: A | Discharge status: Home / Self Care | Payer: Medicare Other | Attending: Emergency Medicine | Admitting: Emergency Medicine

## 2021-08-21 ENCOUNTER — Encounter (HOSPITAL_BASED_OUTPATIENT_CLINIC_OR_DEPARTMENT_OTHER): Payer: Self-pay

## 2021-08-21 ENCOUNTER — Emergency Department (HOSPITAL_BASED_OUTPATIENT_CLINIC_OR_DEPARTMENT_OTHER): Payer: Medicare Other | Admitting: Radiology

## 2021-08-21 DIAGNOSIS — B349 Viral infection, unspecified: Secondary | ICD-10-CM | POA: Diagnosis not present

## 2021-08-21 DIAGNOSIS — Z79899 Other long term (current) drug therapy: Secondary | ICD-10-CM | POA: Insufficient documentation

## 2021-08-21 DIAGNOSIS — I10 Essential (primary) hypertension: Secondary | ICD-10-CM | POA: Diagnosis not present

## 2021-08-21 DIAGNOSIS — H1033 Unspecified acute conjunctivitis, bilateral: Secondary | ICD-10-CM | POA: Diagnosis not present

## 2021-08-21 DIAGNOSIS — Z20822 Contact with and (suspected) exposure to covid-19: Secondary | ICD-10-CM | POA: Insufficient documentation

## 2021-08-21 DIAGNOSIS — R059 Cough, unspecified: Secondary | ICD-10-CM | POA: Diagnosis not present

## 2021-08-21 DIAGNOSIS — J209 Acute bronchitis, unspecified: Secondary | ICD-10-CM

## 2021-08-21 LAB — RESP PANEL BY RT-PCR (FLU A&B, COVID) ARPGX2
Influenza A by PCR: NEGATIVE
Influenza B by PCR: NEGATIVE
SARS Coronavirus 2 by RT PCR: NEGATIVE

## 2021-08-21 MED ORDER — ALBUTEROL SULFATE HFA 108 (90 BASE) MCG/ACT IN AERS
2.0000 | INHALATION_SPRAY | RESPIRATORY_TRACT | Status: DC | PRN
  Administered 2021-08-21: 2 via RESPIRATORY_TRACT
  Filled 2021-08-21: qty 6.7

## 2021-08-21 MED ORDER — FLUTICASONE PROPIONATE HFA 44 MCG/ACT IN AERO
2.0000 | INHALATION_SPRAY | Freq: Two times a day (BID) | RESPIRATORY_TRACT | Status: DC
  Administered 2021-08-21: 2 via RESPIRATORY_TRACT
  Filled 2021-08-21: qty 10.6

## 2021-08-21 MED ORDER — HYDROCOD POLI-CHLORPHE POLI ER 10-8 MG/5ML PO SUER: 5.0000 mL | Freq: Two times a day (BID) | ORAL | 0 refills | Status: DC | PRN

## 2021-08-21 MED ORDER — HYDROCOD POLI-CHLORPHE POLI ER 10-8 MG/5ML PO SUER
5.0000 mL | Freq: Once | ORAL | Status: AC
  Administered 2021-08-21: 5 mL via ORAL
  Filled 2021-08-21: qty 5

## 2021-08-21 MED ORDER — ERYTHROMYCIN 5 MG/GM OP OINT
TOPICAL_OINTMENT | Freq: Four times a day (QID) | OPHTHALMIC | Status: DC
  Filled 2021-08-21: qty 3.5

## 2021-08-21 NOTE — ED Triage Notes (Signed)
Patient arrived with c/o dry cough x1 week. Attempted otc meds but denies relief. Denies fever; denies sob; denies n/v

## 2021-08-21 NOTE — ED Notes (Signed)
Patient transported to X-ray 

## 2021-08-21 NOTE — ED Provider Notes (Signed)
[facility] Provider Note: Georgena Spurling, MD, FACEP  CSN: [id number] MRN: [id number] ARRIVAL: 08/21/21 at [place]: [place]  Cough   HISTORY OF PRESENT ILLNESS  08/21/21 1:56 AM Leslie Cox is a 76 y.o. male with a nonproductive cough for the past week.  It got worse yesterday at dinner and again while sleeping this morning.  It is worse with lying supine.  He denies shortness of breath although admits it feels somewhat difficult to take a deep breath.  He has not had a fever.  He denies nasal congestion.  He is having eye irritation and exudate.  He is having some discomfort in his chest when he coughs.   Past Medical History:  Diagnosis Date   GERD (gastroesophageal reflux disease)    Hypertension    PARESTHESIA 03/16/2010   Qualifier: Diagnosis of  By: Burnice Logan  MD, Doretha Sou    Right acetabular fracture [facility]) 09/15/2012   Unspecified vitamin D deficiency 09/14/2012    Past Surgical History:  Procedure Laterality Date   EXTERNAL FIXATION LEG Right 09/05/2012   Procedure: Application of traction bow externally;  Surgeon: Mauri Pole, MD;  Location: [place];  Service: Orthopedics;  Laterality: Right;   ORIF ACETABULAR FRACTURE Right 09/08/2012   Procedure: OPEN REDUCTION INTERNAL FIXATION (ORIF) ACETABULAR FRACTURE;  Surgeon: Rozanna Box, MD;  Location: [place];  Service: Orthopedics;  Laterality: Right;   TONSILLECTOMY      Family History  Problem Relation Age of Onset   Cancer Neg Hx     Social History   Tobacco Use   Smoking status: Never   Smokeless tobacco: Never  Substance Use Topics   Alcohol use: No   Drug use: No    Prior to Admission medications   Medication Sig Start Date End Date Taking? Authorizing Provider  chlorpheniramine-HYDROcodone (TUSSIONEX PENNKINETIC ER) 10-8 MG/5ML Take 5 mLs by mouth every 12 (twelve) hours as needed for cough (may cause constipation). 08/21/21  Yes Lilo Wallington, Seena, MD   lisinopril-hydrochlorothiazide (ZESTORETIC) 20-12.5 MG tablet TAKE 1 TABLET BY MOUTH EVERY DAY 10/25/19   [name], MD  Multiple Vitamin (MULTI VITAMIN) TABS 1 tablet    [provider]  pantoprazole (PROTONIX) 40 MG tablet Take 1 tablet (40 mg total) by mouth daily. 05/04/21   Isaac Bliss, Rayford Halsted, MD  tamsulosin (FLOMAX) 0.4 MG CAPS capsule Take 1 capsule (0.4 mg total) by mouth daily. 05/04/21   Isaac Bliss, Rayford Halsted, MD    Allergies Patient has no known allergies.   REVIEW OF SYSTEMS  Negative except as noted here or in the History of Present Illness.   PHYSICAL EXAMINATION  Initial Vital Signs Blood pressure 113/69, pulse 98, temperature 98.3 F (36.8 C), temperature source Oral, resp. rate 16, height 5\' 7"  (1.702 m), weight 69.4 kg, SpO2 96 %.  Examination General: Well-developed, well-nourished male in no acute distress; appearance consistent with age of record HENT: normocephalic; atraumatic Eyes: pupils equal, round and reactive to light; extraocular muscles intact; mild conjunctival injection bilaterally with greenish crusted exudate Neck: supple Heart: regular rate and rhythm Lungs: Mildly decreased air movement bilaterally Abdomen: soft; nondistended; nontender; bowel sounds present Extremities: No deformity; full range of motion; pulses normal Neurologic: Awake, alert and oriented; motor function intact in all extremities and symmetric; no facial droop Skin: Warm and dry Psychiatric: Flat affect   RESULTS  Summary of this visit's results, reviewed and interpreted by myself:   EKG Interpretation  Date/Time:    Ventricular Rate:    PR Interval:    QRS Duration:   QT Interval:    QTC Calculation:   R Axis:     Text Interpretation:         Laboratory Studies: Results for orders placed or performed during the hospital encounter of 08/21/21 (from the past 24 hour(s))  Resp Panel by RT-PCR (Flu A&B, Covid) Nasopharyngeal Swab      Status: None   Collection Time: 08/21/21  2:11 AM   Specimen: Nasopharyngeal Swab; Nasopharyngeal(NP) swabs in vial transport medium  Result Value Ref Range   SARS Coronavirus 2 by RT PCR NEGATIVE NEGATIVE   Influenza A by PCR NEGATIVE NEGATIVE   Influenza B by PCR NEGATIVE NEGATIVE   Imaging Studies: DG Chest 2 View  Result Date: 08/21/2021 CLINICAL DATA:  Cough EXAM: CHEST - 2 VIEW COMPARISON:  None FINDINGS: Cardiac shadow is within normal limits. Aortic calcifications are seen. Lungs are well. No focal infiltrate or sizable effusion is seen. No bony abnormality noted. IMPRESSION: No active cardiopulmonary disease. Electronically Signed   By: Inez Catalina M.D.   On: 08/21/2021 02:17    ED COURSE and MDM  Nursing notes, initial and subsequent vitals signs, including pulse oximetry, reviewed and interpreted by myself.  Vitals:   08/21/21 0144 08/21/21 0147 08/21/21 0215  BP: 113/69    Pulse: 98    Resp: 16    Temp: 98.3 F (36.8 C)    TempSrc: Oral    SpO2: 96%  95%  Weight:  69.4 kg   Height:  5\' 7"  (1.702 m)    Medications  albuterol (VENTOLIN HFA) 108 (90 Base) MCG/ACT inhaler 2 puff (2 puffs Inhalation Given 08/21/21 0215)  erythromycin ophthalmic ointment ( Both Eyes Given 08/21/21 0214)  fluticasone (FLOVENT HFA) 44 MCG/ACT inhaler 2 puff (has no administration in time range)  chlorpheniramine-HYDROcodone 10-8 MG/5ML suspension 5 mL (has no administration in time range)   3:01 AM The patient's presentation is consistent with an acute viral illness.  He is negative for COVID and influenza.  We will provide him an albuterol inhaler and instruct him in its use.  We will also start him on a Flovent inhaler.  I do not believe antibiotics are indicated as he is not having fevers and there are no signs of infiltrate on chest x-ray.  We will start him on erythromycin ophthalmic ointment for his conjunctivitis.  It is likely viral but could have a secondary bacterial  component   PROCEDURES  Procedures   ED DIAGNOSES     ICD-10-CM   1. Acute bronchitis with bronchospasm  J20.9     2. Acute conjunctivitis of both eyes, unspecified acute conjunctivitis type  H10.33          Sahiba Granholm, Jenny Reichmann, MD 08/21/21 [phone]

## 2021-08-28 ENCOUNTER — Other Ambulatory Visit: Payer: Self-pay | Admitting: *Deleted

## 2021-08-28 MED ORDER — LISINOPRIL-HYDROCHLOROTHIAZIDE 20-12.5 MG PO TABS: 1.0000 | ORAL_TABLET | Freq: Every day | ORAL | 1 refills | Status: DC

## 2021-09-01 ENCOUNTER — Other Ambulatory Visit: Payer: Self-pay | Admitting: Internal Medicine

## 2021-09-01 DIAGNOSIS — R3911 Hesitancy of micturition: Secondary | ICD-10-CM

## 2021-09-01 DIAGNOSIS — K219 Gastro-esophageal reflux disease without esophagitis: Secondary | ICD-10-CM

## 2021-09-01 DIAGNOSIS — N401 Enlarged prostate with lower urinary tract symptoms: Secondary | ICD-10-CM

## 2021-10-15 ENCOUNTER — Encounter: Payer: Self-pay | Admitting: Internal Medicine

## 2021-11-30 ENCOUNTER — Telehealth: Payer: Self-pay | Admitting: Internal Medicine

## 2021-11-30 NOTE — Telephone Encounter (Signed)
Patient would like to [facility] from dr Jerilee Hoh to dr Cherlynn Kaiser - his wife is already a patient of dr Cherlynn Kaiser -

## 2021-12-02 ENCOUNTER — Other Ambulatory Visit: Payer: Self-pay | Admitting: Internal Medicine

## 2021-12-03 NOTE — Telephone Encounter (Signed)
Vml for pt to call back and sch TOC with Cherlynn Kaiser

## 2021-12-10 ENCOUNTER — Ambulatory Visit (INDEPENDENT_AMBULATORY_CARE_PROVIDER_SITE_OTHER): Payer: Medicare Other | Admitting: Family Medicine

## 2021-12-10 ENCOUNTER — Ambulatory Visit: Payer: Medicare Other | Admitting: Internal Medicine

## 2021-12-10 ENCOUNTER — Encounter: Payer: Self-pay | Admitting: Family Medicine

## 2021-12-10 VITALS — BP 130/70 | HR 74 | Temp 98.2°F | Ht 67.0 in | Wt 154.2 lb

## 2021-12-10 DIAGNOSIS — N401 Enlarged prostate with lower urinary tract symptoms: Secondary | ICD-10-CM

## 2021-12-10 DIAGNOSIS — I10 Essential (primary) hypertension: Secondary | ICD-10-CM | POA: Diagnosis not present

## 2021-12-10 DIAGNOSIS — R351 Nocturia: Secondary | ICD-10-CM

## 2021-12-10 DIAGNOSIS — K219 Gastro-esophageal reflux disease without esophagitis: Secondary | ICD-10-CM

## 2021-12-10 LAB — COMPREHENSIVE METABOLIC PANEL
ALT: 18 U/L (ref 0–53)
AST: 17 U/L (ref 0–37)
Albumin: 4.4 g/dL (ref 3.5–5.2)
Alkaline Phosphatase: 58 U/L (ref 39–117)
BUN: 16 mg/dL (ref 6–23)
CO2: 30 mEq/L (ref 19–32)
Calcium: 9.6 mg/dL (ref 8.4–10.5)
Chloride: 98 mEq/L (ref 96–112)
Creatinine, Ser: 0.92 mg/dL (ref 0.40–1.50)
GFR: 82.54 mL/min (ref >60.00)
Glucose, Bld: 112 mg/dL — ABNORMAL HIGH (ref 70–99)
Potassium: 4.2 mEq/L (ref 3.5–5.1)
Sodium: 135 mEq/L (ref 135–145)
Total Bilirubin: 0.6 mg/dL (ref 0.2–1.2)
Total Protein: 7.1 g/dL (ref 6.0–8.3)

## 2021-12-10 LAB — CBC WITH DIFFERENTIAL/PLATELET
Basophils Absolute: 0 10*3/uL (ref 0.0–0.1)
Basophils Relative: 0.5 % (ref 0.0–3.0)
Eosinophils Absolute: 0.1 10*3/uL (ref 0.0–0.7)
Eosinophils Relative: 2.1 % (ref 0.0–5.0)
HCT: 43.7 % (ref 39.0–52.0)
Hemoglobin: 14.9 g/dL (ref 13.0–17.0)
Lymphocytes Relative: 18.3 % (ref 12.0–46.0)
Lymphs Abs: 0.9 10*3/uL (ref 0.7–4.0)
MCHC: 34.2 g/dL (ref 30.0–36.0)
MCV: 91.8 fl (ref 78.0–100.0)
Monocytes Absolute: 0.3 10*3/uL (ref 0.1–1.0)
Monocytes Relative: 6.8 % (ref 3.0–12.0)
Neutro Abs: 3.5 10*3/uL (ref 1.4–7.7)
Neutrophils Relative %: 72.3 % (ref 43.0–77.0)
Platelets: 223 10*3/uL (ref 150.0–400.0)
RBC: 4.76 Mil/uL (ref 4.22–5.81)
RDW: 12.6 % (ref 11.5–15.5)
WBC: 4.8 10*3/uL (ref 4.0–10.5)

## 2021-12-10 LAB — LIPID PANEL
Cholesterol: 184 mg/dL (ref 0–200)
HDL: 38.8 mg/dL — ABNORMAL LOW (ref >39.00)
LDL Cholesterol: 114 mg/dL — ABNORMAL HIGH (ref 0–99)
NonHDL: 145.58
Total CHOL/HDL Ratio: 5
Triglycerides: 159 mg/dL — ABNORMAL HIGH (ref 0.0–149.0)
VLDL: 31.8 mg/dL (ref 0.0–40.0)

## 2021-12-10 LAB — MAGNESIUM: Magnesium: 1.9 mg/dL (ref 1.5–2.5)

## 2021-12-10 LAB — VITAMIN B12: Vitamin B-12: 494 pg/mL (ref 211–911)

## 2021-12-10 LAB — TSH: TSH: 1.45 u[IU]/mL (ref 0.35–5.50)

## 2021-12-10 NOTE — Progress Notes (Signed)
Subjective:     Patient ID: Leslie Cox, male    DOB: 03/27/1946, 76 y.o.   MRN: [id number]  Chief Complaint  Patient presents with   Transfer of Care    Not fasting     HPI TOC-wife comes here  HTN-Pt is on Lisinopril/hct .  Bp's running - not checking.  No ha/dizziness/cp/palp/edema/cough/sob  Bph-nocturia-controlled on tamsulosin.  GERD-doing well on pantaprazole. No dysphagia.  Health Maintenance Due  Topic Date Due   Hepatitis C Screening  Never done    Past Medical History:  Diagnosis Date   GERD (gastroesophageal reflux disease)    Hypertension    PARESTHESIA 03/16/2010   Qualifier: Diagnosis of  By: Burnice Logan  MD, Doretha Sou    Right acetabular fracture ([place]) 09/15/2012   Unspecified vitamin D deficiency 09/14/2012    Past Surgical History:  Procedure Laterality Date   EXTERNAL FIXATION LEG Right 09/05/2012   Procedure: Application of traction bow externally;  Surgeon: Mauri Pole, MD;  Location: [place];  Service: Orthopedics;  Laterality: Right;   ORIF ACETABULAR FRACTURE Right 09/08/2012   Procedure: OPEN REDUCTION INTERNAL FIXATION (ORIF) ACETABULAR FRACTURE;  Surgeon: Rozanna Box, MD;  Location: [place];  Service: Orthopedics;  Laterality: Right;   TONSILLECTOMY      Outpatient Medications Prior to Visit  Medication Sig Dispense Refill   lisinopril-hydrochlorothiazide (ZESTORETIC) 20-12.5 MG tablet TAKE 1 TABLET BY MOUTH DAILY 90 tablet 1   Multiple Vitamin (MULTI VITAMIN) TABS 1 tablet     pantoprazole (PROTONIX) 40 MG tablet TAKE 1 TABLET BY MOUTH DAILY 90 tablet 1   tamsulosin (FLOMAX) 0.4 MG CAPS capsule TAKE 1 CAPSULE BY MOUTH DAILY 90 capsule 1   chlorpheniramine-HYDROcodone (TUSSIONEX PENNKINETIC ER) 10-8 MG/5ML Take 5 mLs by mouth every 12 (twelve) hours as needed for cough (may cause constipation). (Patient not taking: Reported on 12/10/2021) 70 mL 0   No facility-administered medications prior to visit.    No Known Allergies ROS  ROS: Gen:  no fever, chills  Skin: no rash, itching ENT: no ear pain, ear drainage, nasal congestion, rhinorrhea, sinus pressure, sore throat Eyes: no blurry vision, double vision Resp: no cough, wheeze,SOB CV: no CP, palpitations, LE edema,  GI: no heartburn, n/v/d/c, abd pain GU: no dysuria, urgency, frequency, hematuria MSK: shoulders some Neuro: no dizziness, headache, weakness, vertigo Psych: no depression, anxiety, insomnia, SI      Objective:     BP 130/70   Pulse 74   Temp 98.2 F (36.8 C) (Temporal)   Ht '5\' 7"'$  (1.702 m)   Wt 154 lb 4 oz (70 kg)   SpO2 97%   BMI 24.16 kg/m  Wt Readings from Last 3 Encounters:  12/10/21 154 lb 4 oz (70 kg)  08/21/21 153 lb (69.4 kg)  06/08/21 154 lb 11.2 oz (70.2 kg)    Physical Exam   Gen: WDWN NAD wm HEENT: NCAT, conjunctiva not injected, sclera nonicteric NECK:  supple, no thyromegaly, no nodes, no carotid bruits CARDIAC: RRR, S1S2+, no murmur. DP 2+B LUNGS: CTAB. No wheezes ABDOMEN:  BS+, soft, NTND, No HSM, no masses EXT:  no edema MSK: no gross abnormalities.  NEURO: A&O x3.  CN II-XII intact.  PSYCH: normal mood. Good eye contact.  Joking a lot-dry humor     Assessment & Plan:   Problem List Items Addressed This Visit       Cardiovascular and Mediastinum   Hypertension - Primary   Relevant Orders   Comprehensive metabolic  panel   TSH   Lipid panel   Magnesium   Vitamin B12   CBC with Differential/Platelet     Digestive   Acid reflux   Relevant Orders   Magnesium   Vitamin B12   CBC with Differential/Platelet     Other   Benign prostatic hyperplasia with nocturia  Hypertension-chronic.  Well-controlled.  Continue/renewed lisinopril/HCT 20/12.5.  Check a CMP TSH lipids magnesium CBC with differential. 2.  GERD-chronic.  Controlled on Protonix 40 mg p.o. daily.  Renewed.  Cannot get a straight answer as to why he is on this every day.  Will try every other day.  If symptoms return, then go back to daily.  Possibly  getting him off of medication.  Check magnesium, B12, CBC (on chronic PPI therapy) 3.  BPH with nocturia.  Chronic.  Well-controlled on tamsulosin 0.4 mg.  Continue  Follow-up 6 months  No orders of the defined types were placed in this encounter.   Wellington Hampshire, MD

## 2021-12-10 NOTE — Patient Instructions (Addendum)
It was very nice to see you today!  Try taking pantaprazole every other day.  If heartburn returns, let me know   PLEASE NOTE:  If you had any lab tests please let us know if you have not heard back within a few days. You may see your results on MyChart before we have a chance to review them but we will give you a call once they are reviewed by [place]. If we ordered any referrals today, please let us know if you have not heard from their office within the next week.   Please try these tips to maintain a healthy lifestyle:  Eat most of your calories during the day when you are active. Eliminate processed foods including packaged sweets (pies, cakes, cookies), reduce intake of potatoes, white bread, white pasta, and white rice. Look for whole grain options, oat flour or almond flour.  Each meal should contain half fruits/vegetables, one quarter protein, and one quarter carbs (no bigger than a computer mouse).  Cut down on sweet beverages. This includes juice, soda, and sweet tea. Also watch fruit intake, though this is a healthier sweet option, it still contains natural sugar! Limit to 3 servings daily.  Drink at least 1 glass of water with each meal and aim for at least 8 glasses per day  Exercise at least 150 minutes every week.

## 2021-12-11 ENCOUNTER — Telehealth: Payer: Self-pay | Admitting: Family Medicine

## 2021-12-11 NOTE — Telephone Encounter (Signed)
Pt returning phone call about lab.  Pt states he will be available around 2:00 or 2:15 for a return phone call.

## 2021-12-11 NOTE — Telephone Encounter (Signed)
Patient returned call to office, gave lab results and recommendations. Patient verbalized understanding.

## 2021-12-15 ENCOUNTER — Other Ambulatory Visit: Payer: Self-pay | Admitting: Internal Medicine

## 2021-12-15 DIAGNOSIS — N401 Enlarged prostate with lower urinary tract symptoms: Secondary | ICD-10-CM

## 2021-12-15 DIAGNOSIS — K219 Gastro-esophageal reflux disease without esophagitis: Secondary | ICD-10-CM

## 2021-12-27 ENCOUNTER — Ambulatory Visit (INDEPENDENT_AMBULATORY_CARE_PROVIDER_SITE_OTHER): Payer: Medicare Other | Admitting: Family Medicine

## 2021-12-27 ENCOUNTER — Encounter: Payer: Self-pay | Admitting: Family Medicine

## 2021-12-27 VITALS — BP 130/76 | HR 78 | Temp 98.0°F | Ht 67.0 in | Wt 154.5 lb

## 2021-12-27 DIAGNOSIS — R1031 Right lower quadrant pain: Secondary | ICD-10-CM

## 2021-12-27 DIAGNOSIS — N401 Enlarged prostate with lower urinary tract symptoms: Secondary | ICD-10-CM | POA: Diagnosis not present

## 2021-12-27 DIAGNOSIS — R3911 Hesitancy of micturition: Secondary | ICD-10-CM | POA: Diagnosis not present

## 2021-12-27 DIAGNOSIS — R1032 Left lower quadrant pain: Secondary | ICD-10-CM

## 2021-12-27 LAB — POCT URINALYSIS DIPSTICK
Bilirubin, UA: NEGATIVE
Blood, UA: NEGATIVE
Glucose, UA: NEGATIVE
Ketones, UA: NEGATIVE
Nitrite, UA: NEGATIVE
Protein, UA: NEGATIVE
Spec Grav, UA: 1.015 (ref 1.010–1.025)
Urobilinogen, UA: 0.2 E.U./dL
pH, UA: 7 (ref 5.0–8.0)

## 2021-12-27 LAB — PSA: PSA: 3.34 ng/mL (ref 0.10–4.00)

## 2021-12-27 MED ORDER — CIPROFLOXACIN HCL 500 MG PO TABS: 500.0000 mg | ORAL_TABLET | Freq: Two times a day (BID) | ORAL | 0 refills | Status: AC

## 2021-12-27 NOTE — Patient Instructions (Addendum)
It was very nice to see you today!  If pain continues to increase, urine getting worse, can't pee, got to Emergency room  Increase Tamsulosin to twice daily for 10 days Take antibiotics.      PLEASE NOTE:  If you had any lab tests please let us know if you have not heard back within a few days. You may see your results on MyChart before we have a chance to review them but we will give you a call once they are reviewed by [place]. If we ordered any referrals today, please let us know if you have not heard from their office within the next week.   Please try these tips to maintain a healthy lifestyle:  Eat most of your calories during the day when you are active. Eliminate processed foods including packaged sweets (pies, cakes, cookies), reduce intake of potatoes, white bread, white pasta, and white rice. Look for whole grain options, oat flour or almond flour.  Each meal should contain half fruits/vegetables, one quarter protein, and one quarter carbs (no bigger than a computer mouse).  Cut down on sweet beverages. This includes juice, soda, and sweet tea. Also watch fruit intake, though this is a healthier sweet option, it still contains natural sugar! Limit to 3 servings daily.  Drink at least 1 glass of water with each meal and aim for at least 8 glasses per day  Exercise at least 150 minutes every week.

## 2021-12-27 NOTE — Progress Notes (Signed)
Subjective:     Patient ID: Leslie Cox, male    DOB: [birth year], 76 y.o.   MRN: [id number]  Chief Complaint  Patient presents with   Pain    Pain in groin area that started 1 week ago, possible bladder infection    Urinary Retention    Sometimes it is hard to urinate, other times seems like urine keep coming     HPI 1 wk of burning suprapubic intermitt but increasing.  Occ leaking, can go a lot or has to strain to go. No dysuria. No blood. No n/c/n/v.    Taking flomax regularly  Health Maintenance Due  Topic Date Due   Hepatitis C Screening  Never done    Past Medical History:  Diagnosis Date   GERD (gastroesophageal reflux disease)    Hypertension    PARESTHESIA 03/16/2010   Qualifier: Diagnosis of  By: Burnice Logan  MD, Doretha Sou    Right acetabular fracture ([place]) 09/15/2012   Unspecified vitamin D deficiency 09/14/2012    Past Surgical History:  Procedure Laterality Date   EXTERNAL FIXATION LEG Right 09/05/2012   Procedure: Application of traction bow externally;  Surgeon: Mauri Pole, MD;  Location: [place];  Service: Orthopedics;  Laterality: Right;   ORIF ACETABULAR FRACTURE Right 09/08/2012   Procedure: OPEN REDUCTION INTERNAL FIXATION (ORIF) ACETABULAR FRACTURE;  Surgeon: Rozanna Box, MD;  Location: [place];  Service: Orthopedics;  Laterality: Right;   TONSILLECTOMY      Outpatient Medications Prior to Visit  Medication Sig Dispense Refill   lisinopril-hydrochlorothiazide (ZESTORETIC) 20-12.5 MG tablet TAKE 1 TABLET BY MOUTH DAILY 90 tablet 1   Multiple Vitamin (MULTI VITAMIN) TABS 1 tablet     pantoprazole (PROTONIX) 40 MG tablet TAKE 1 TABLET BY MOUTH DAILY 90 tablet 1   tamsulosin (FLOMAX) 0.4 MG CAPS capsule TAKE 1 CAPSULE BY MOUTH DAILY 90 capsule 1   No facility-administered medications prior to visit.    No Known Allergies ROS neg/noncontributory except as noted HPI/below      Objective:     BP 130/76   Pulse 78   Temp 98 F (36.7 C)  (Temporal)   Ht '5\' 7"'$  (1.702 m)   Wt 154 lb 8 oz (70.1 kg)   SpO2 98%   BMI 24.20 kg/m  Wt Readings from Last 3 Encounters:  12/27/21 154 lb 8 oz (70.1 kg)  12/10/21 154 lb 4 oz (70 kg)  08/21/21 153 lb (69.4 kg)    Physical Exam   Gen: WDWN NAD HEENT: NCAT, conjunctiva not injected, sclera nonicteric NECK:  supple, no thyromegaly, no nodes, no carotid bruits CARDIAC: RRR, S1S2+, no murmur. DP 2+B LUNGS: CTAB. No wheezes ABDOMEN:  BS+, soft, diffusely tender mildly all over esp suprapubically  No HSM, no masses EXT:  no edema MSK: no gross abnormalities.  NEURO: A&O x3.  CN II-XII intact.  PSYCH: normal mood. Good eye contact.  Seems a little off Prostate enlarged.  Chaperone QJ present  Results for orders placed or performed in visit on 12/27/21  POCT urinalysis dipstick  Result Value Ref Range   Color, UA yellow    Clarity, UA clear    Glucose, UA Negative Negative   Bilirubin, UA negative    Ketones, UA negative    Spec Grav, UA 1.015 1.010 - 1.025   Blood, UA negative    pH, UA 7.0 5.0 - 8.0   Protein, UA Negative Negative   Urobilinogen, UA 0.2 0.2 or 1.0 E.U./dL  Nitrite, UA negative    Leukocytes, UA Trace (A) Negative   Appearance     Odor          Assessment & Plan:   Problem List Items Addressed This Visit   None Visit Diagnoses     Bilateral groin pain    -  Primary   Relevant Orders   POCT urinalysis dipstick   Benign prostatic hyperplasia with urinary hesitancy       Relevant Orders   Urine Culture   PSA      Bph w/acute urinary complaints.  ?UTI,?prostatitis,worsening bph.   Check cx, check psa.  Inc tamsulosin to bid for 10d and cipro '500mg'$  bid x 10d.  ER if retention/worse.  CT in Oct-prostate ok  No orders of the defined types were placed in this encounter.   Wellington Hampshire, MD

## 2021-12-28 LAB — URINE CULTURE
MICRO NUMBER:: 13588825
Result:: NO GROWTH
SPECIMEN QUALITY:: ADEQUATE

## 2022-03-15 ENCOUNTER — Ambulatory Visit (INDEPENDENT_AMBULATORY_CARE_PROVIDER_SITE_OTHER): Payer: Medicare Other | Admitting: Family Medicine

## 2022-03-15 ENCOUNTER — Encounter: Payer: Self-pay | Admitting: Family Medicine

## 2022-03-15 VITALS — BP 123/73 | HR 76 | Temp 97.5°F | Ht 67.0 in | Wt 154.6 lb

## 2022-03-15 DIAGNOSIS — N401 Enlarged prostate with lower urinary tract symptoms: Secondary | ICD-10-CM | POA: Diagnosis not present

## 2022-03-15 DIAGNOSIS — R3911 Hesitancy of micturition: Secondary | ICD-10-CM | POA: Diagnosis not present

## 2022-03-15 DIAGNOSIS — R350 Frequency of micturition: Secondary | ICD-10-CM | POA: Diagnosis not present

## 2022-03-15 LAB — POCT URINALYSIS DIPSTICK
Bilirubin, UA: NEGATIVE
Blood, UA: NEGATIVE
Glucose, UA: NEGATIVE
Ketones, UA: NEGATIVE
Leukocytes, UA: NEGATIVE
Nitrite, UA: NEGATIVE
Protein, UA: NEGATIVE
Spec Grav, UA: 1.015 (ref 1.010–1.025)
Urobilinogen, UA: 0.2 E.U./dL
pH, UA: 6 (ref 5.0–8.0)

## 2022-03-15 MED ORDER — TAMSULOSIN HCL 0.4 MG PO CAPS: 0.4000 mg | ORAL_CAPSULE | Freq: Two times a day (BID) | ORAL | 1 refills | Status: DC

## 2022-03-15 MED ORDER — CIPROFLOXACIN HCL 500 MG PO TABS: 500.0000 mg | ORAL_TABLET | Freq: Two times a day (BID) | ORAL | 0 refills | Status: AC

## 2022-03-15 MED ORDER — TAMSULOSIN HCL 0.4 MG PO CAPS: 0.4000 mg | ORAL_CAPSULE | Freq: Every day | ORAL | 3 refills | Status: DC

## 2022-03-15 NOTE — Patient Instructions (Addendum)
Take the tamsulosin twice daily for 1 week then once/day.  DON'T run out of tamsulosin ever-you will need this med probably for life.     I feel the problem is more from running out of meds.  But, there may be a low grade prostate infection so sending antibiotics-Cipro to the pharmacy([facility]) as well

## 2022-03-15 NOTE — Progress Notes (Signed)
Subjective:     Patient ID: Leslie Cox, male    DOB: 09-20-1945, 76 y.o.   MRN: [id number]  Chief Complaint  Patient presents with   Urinary Frequency    Started 2 ago, has not taken anything for pain.    Abdominal Pain    HPI Poss UTI.  Urinary frequency for 2 days. No dysuria.  Ran out of tamsulosin 3 wks ago. Sometimes hesitancy and sometimes just leaks like crazy. Lower abd pain for 2 days.  Has BM daily.  No f/c.  No n/v.    Health Maintenance Due  Topic Date Due   Hepatitis C Screening  Never done   Zoster Vaccines- Shingrix (1 of 2) Never done   COVID-19 Vaccine (3 - Pfizer series) 12/08/2019   TETANUS/TDAP  02/13/2021   INFLUENZA VACCINE  01/29/2022    Past Medical History:  Diagnosis Date   GERD (gastroesophageal reflux disease)    Hypertension    PARESTHESIA 03/16/2010   Qualifier: Diagnosis of  By: Burnice Logan  MD, Doretha Sou    Right acetabular fracture ([place]) 09/15/2012   Unspecified vitamin D deficiency 09/14/2012    Past Surgical History:  Procedure Laterality Date   EXTERNAL FIXATION LEG Right 09/05/2012   Procedure: Application of traction bow externally;  Surgeon: Mauri Pole, MD;  Location: [place];  Service: Orthopedics;  Laterality: Right;   ORIF ACETABULAR FRACTURE Right 09/08/2012   Procedure: OPEN REDUCTION INTERNAL FIXATION (ORIF) ACETABULAR FRACTURE;  Surgeon: Rozanna Box, MD;  Location: [place];  Service: Orthopedics;  Laterality: Right;   TONSILLECTOMY      Outpatient Medications Prior to Visit  Medication Sig Dispense Refill   lisinopril-hydrochlorothiazide (ZESTORETIC) 20-12.5 MG tablet TAKE 1 TABLET BY MOUTH DAILY 90 tablet 1   Multiple Vitamin (MULTI VITAMIN) TABS 1 tablet     pantoprazole (PROTONIX) 40 MG tablet TAKE 1 TABLET BY MOUTH DAILY (Patient not taking: Reported on 03/15/2022) 90 tablet 1   tamsulosin (FLOMAX) 0.4 MG CAPS capsule TAKE 1 CAPSULE BY MOUTH DAILY (Patient not taking: Reported on 03/15/2022) 90 capsule 1   No  facility-administered medications prior to visit.    No Known Allergies ROS neg/noncontributory except as noted HPI/below      Objective:     BP 123/73 (BP Location: Left Arm, Patient Position: Sitting, Cuff Size: Normal)   Pulse 76   Temp (!) 97.5 F (36.4 C) (Oral)   Ht '5\' 7"'$  (1.702 m)   Wt 154 lb 9.6 oz (70.1 kg)   BMI 24.21 kg/m  Wt Readings from Last 3 Encounters:  03/15/22 154 lb 9.6 oz (70.1 kg)  12/27/21 154 lb 8 oz (70.1 kg)  12/10/21 154 lb 4 oz (70 kg)    Physical Exam   Gen: WDWN NAD HEENT: NCAT, conjunctiva not injected, sclera nonicteric ABDOMEN:  BS+, soft, tender suprapubically. No HSM, no masses. No cvat EXT:  no edema MSK: no gross abnormalities.  NEURO: A&O x3.  CN II-XII intact.  PSYCH: normal mood. Good eye contact   UA neg Results for orders placed or performed in visit on 03/15/22  POCT Urinalysis Dipstick  Result Value Ref Range   Negative     Negative     Negative     Negative     Negative     Spec Grav, UA     Negative     pH, UA     Negative     Urobilinogen, UA     Nitrite, UA  Leukocytes, UA     Appearance     Odor     Negative    POCT Urinalysis Dipstick  Result Value Ref Range   Color, UA light yellow    Clarity, UA clear    Glucose, UA Negative Negative   Bilirubin, UA negative    Ketones, UA negative    Spec Grav, UA 1.015 1.010 - 1.025   Blood, UA negative    pH, UA 6.0 5.0 - 8.0   Protein, UA Negative Negative   Urobilinogen, UA 0.2 0.2 or 1.0 E.U./dL   Nitrite, UA negative    Leukocytes, UA Negative Negative   Appearance     Odor          Assessment & Plan:   Problem List Items Addressed This Visit   None Visit Diagnoses     Urinary frequency    -  Primary   Relevant Orders   POCT Urinalysis Dipstick (Completed)   POCT Urinalysis Dipstick (Completed)   Benign prostatic hyperplasia with urinary hesitancy       Relevant Medications   tamsulosin (FLOMAX) 0.4 MG CAPS capsule   tamsulosin (FLOMAX)  0.4 MG CAPS capsule      Urinary freq-acute.  Suspect from running out of flomax and prostate flared.  May have some prostatitis as well.  Will do cipro '500mg'$  bid x 10d and tamsulosin 0.'4mg'$  bid x 7 days.  If keeps recurring and NOT running out of meds, will need to see urol.  BPH w/LUTS-renewed tamsulosin 0.'4mg'$  daily to opturm.   Advised NOT to run out/stop the tamsulosin ever.    Meds ordered this encounter  Medications   tamsulosin (FLOMAX) 0.4 MG CAPS capsule    Sig: Take 1 capsule (0.4 mg total) by mouth daily.    Dispense:  90 capsule    Refill:  3    Requesting 1 year supply   tamsulosin (FLOMAX) 0.4 MG CAPS capsule    Sig: Take 1 capsule (0.4 mg total) by mouth in the morning and at bedtime.    Dispense:  30 capsule    Refill:  1   ciprofloxacin (CIPRO) 500 MG tablet    Sig: Take 1 tablet (500 mg total) by mouth 2 (two) times daily for 10 days.    Dispense:  20 tablet    Refill:  0    Wellington Hampshire, MD

## 2022-03-25 ENCOUNTER — Encounter: Payer: Self-pay | Admitting: *Deleted

## 2022-03-28 ENCOUNTER — Encounter: Payer: Self-pay | Admitting: Family Medicine

## 2022-03-31 ENCOUNTER — Other Ambulatory Visit: Payer: Self-pay | Admitting: Internal Medicine

## 2022-04-01 ENCOUNTER — Ambulatory Visit (INDEPENDENT_AMBULATORY_CARE_PROVIDER_SITE_OTHER): Payer: Medicare Other | Admitting: Family

## 2022-04-01 ENCOUNTER — Encounter: Payer: Self-pay | Admitting: Family

## 2022-04-01 VITALS — BP 138/70 | HR 71 | Temp 98.0°F | Ht 67.0 in | Wt 155.4 lb

## 2022-04-01 DIAGNOSIS — M7918 Myalgia, other site: Secondary | ICD-10-CM

## 2022-04-01 DIAGNOSIS — K219 Gastro-esophageal reflux disease without esophagitis: Secondary | ICD-10-CM

## 2022-04-01 MED ORDER — FAMOTIDINE 20 MG PO TABS: 20.0000 mg | ORAL_TABLET | Freq: Two times a day (BID) | ORAL | 2 refills | Status: DC

## 2022-04-01 NOTE — Progress Notes (Signed)
Patient ID: Leslie Cox, male    DOB: [birth year], 76 y.o.   MRN: [id number]  Chief Complaint  Patient presents with   Tailbone Pain    Pt c/o tailbone back pain for about 2-3 weeks. Has tried Cipro  which did not help. Pt states it hurts to sit.    Abdominal Pain    Pt c/o lower abdominal pain for about 3-4 days. Has tried tylenol which did not help.     HPI:    Right buttock pain:  reports a burning pain in buttock and sometimes he feels it in this thighs. 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 Denies any long car trips.   Acid reflux:  with belly gas pains, belching, but denies heartburn, has taken OTC pepto bismol with some relief. Reports sx mostly at bedtime.      Assessment & Plan:  1. Gastroesophageal reflux disease without esophagitis sending Pepcid, advised on use & SE. Take for 2 weeks to help sx, then reduce to daily, f/u with PCP if not helping sx  - famotidine (PEPCID) 20 MG tablet; Take 1 tablet (20 mg total) by mouth 2 (two) times daily with a meal.  Dispense: 60 tablet; Refill: 2  2. Right buttock pain unsure if sciatica based on pt description of sx, advised on not sitting for long periods, standing every hour and walking around for a few minutes on days he is not working, demonstrated hamstring and gluteal stretches daily, ok to take Tylenol 2-3x/d prn, use ice or heat prn up to 39mnutes tid.  Subjective:    Outpatient Medications Prior to Visit  Medication Sig Dispense Refill   lisinopril-hydrochlorothiazide (ZESTORETIC) 20-12.5 MG tablet TAKE 1 TABLET BY MOUTH DAILY 90 tablet 1   Multiple Vitamin (MULTI VITAMIN) TABS 1 tablet     pantoprazole (PROTONIX) 40 MG tablet TAKE 1 TABLET BY MOUTH DAILY 90 tablet 1   tamsulosin (FLOMAX) 0.4 MG CAPS capsule Take 1 capsule (0.4 mg total) by mouth daily. 90 capsule 3   tamsulosin (FLOMAX) 0.4 MG CAPS capsule Take 1 capsule (0.4 mg total) by mouth in the morning and at bedtime. (Patient not  taking: Reported on 04/01/2022) 30 capsule 1   No facility-administered medications prior to visit.   Past Medical History:  Diagnosis Date   GERD (gastroesophageal reflux disease)    Hypertension    PARESTHESIA 03/16/2010   Qualifier: Diagnosis of  By: KBurnice Logan MD, PDoretha Sou   Right acetabular fracture ([facility] 09/15/2012   Unspecified vitamin D deficiency 09/14/2012   Past Surgical History:  Procedure Laterality Date   EXTERNAL FIXATION LEG Right 09/05/2012   Procedure: Application of traction bow externally;  Surgeon: MMauri Pole MD;  Location[place] [facility]  Service: Orthopedics;  Laterality: Right;   ORIF ACETABULAR FRACTURE Right 09/08/2012   Procedure: OPEN REDUCTION INTERNAL FIXATION (ORIF) ACETABULAR FRACTURE;  Surgeon: MRozanna Box MD;  Location: [facility]  Service: Orthopedics;  Laterality: Right;   TONSILLECTOMY     No Known Allergies    Objective:    Physical Exam Vitals and nursing note reviewed.  Constitutional:      General: He is not in acute distress.    Appearance: Normal appearance.  HENT:     Head: Normocephalic.  Cardiovascular:     Rate and Rhythm: Normal rate and regular rhythm.  Pulmonary:     Effort: Pulmonary effort is normal.     Breath sounds: Normal breath  sounds.  Abdominal:     Palpations: Abdomen is soft.     Tenderness: There is abdominal tenderness in the periumbilical area. Negative signs include Murphy's sign and McBurney's sign.     Hernia: No hernia is present. There is no hernia in the right inguinal area (scar noted -hx hernia repair).  Musculoskeletal:     Cervical back: Normal range of motion.     Lumbar back: No swelling, tenderness or bony tenderness. Normal range of motion.  Skin:    General: Skin is warm and dry.  Neurological:     Mental Status: He is alert and oriented to person, place, and time.  Psychiatric:        Mood and Affect: Mood normal.    BP 138/70 (BP Location: Left Arm, Patient Position: Sitting, Cuff Size:  Large)   Pulse 71   Temp 98 F (36.7 C) (Temporal)   Ht '5\' 7"'$  (1.702 m)   Wt 155 lb 6.4 oz (70.5 kg)   SpO2 96%   BMI 24.34 kg/m  Wt Readings from Last 3 Encounters:  04/01/22 155 lb 6.4 oz (70.5 kg)  03/15/22 154 lb 9.6 oz (70.1 kg)  12/27/21 154 lb 8 oz (70.1 kg)       Jeanie Sewer, NP

## 2022-04-01 NOTE — Patient Instructions (Signed)
It was very nice to see you today!   I have sent over generic Pepcid to help with your gas pains & belching.  Let Dr. Cherlynn Kaiser know if your urination problems are still no better with taking the Flomax daily.  For your tailbone and buttock pain: Do NOT sit for long periods on your bottom. Get up every hour and walk around. The pressure creates more pain if you are sitting for a long time. OK to take Tylenol for pain 2-3 times per day as needed.      PLEASE NOTE:  If you had any lab tests please let us know if you have not heard back within a few days. You may see your results on MyChart before we have a chance to review them but we will give you a call once they are reviewed by [place]. If we ordered any referrals today, please let us know if you have not heard from their office within the next week.

## 2022-05-13 ENCOUNTER — Encounter: Payer: Self-pay | Admitting: Family Medicine

## 2022-05-13 ENCOUNTER — Other Ambulatory Visit: Payer: Self-pay | Admitting: *Deleted

## 2022-05-13 DIAGNOSIS — K219 Gastro-esophageal reflux disease without esophagitis: Secondary | ICD-10-CM

## 2022-05-13 MED ORDER — PANTOPRAZOLE SODIUM 40 MG PO TBEC: 40.0000 mg | DELAYED_RELEASE_TABLET | Freq: Every day | ORAL | 3 refills | Status: DC

## 2022-06-10 ENCOUNTER — Encounter: Payer: Self-pay | Admitting: Family Medicine

## 2022-06-10 ENCOUNTER — Telehealth: Payer: Self-pay | Admitting: Family Medicine

## 2022-06-10 ENCOUNTER — Ambulatory Visit (INDEPENDENT_AMBULATORY_CARE_PROVIDER_SITE_OTHER): Payer: Medicare Other | Admitting: Family Medicine

## 2022-06-10 VITALS — BP 130/70 | HR 77 | Temp 98.0°F | Ht 67.0 in | Wt 157.1 lb

## 2022-06-10 DIAGNOSIS — N401 Enlarged prostate with lower urinary tract symptoms: Secondary | ICD-10-CM

## 2022-06-10 DIAGNOSIS — R739 Hyperglycemia, unspecified: Secondary | ICD-10-CM | POA: Diagnosis not present

## 2022-06-10 DIAGNOSIS — Z23 Encounter for immunization: Secondary | ICD-10-CM

## 2022-06-10 DIAGNOSIS — R351 Nocturia: Secondary | ICD-10-CM | POA: Diagnosis not present

## 2022-06-10 DIAGNOSIS — Z0000 Encounter for general adult medical examination without abnormal findings: Secondary | ICD-10-CM

## 2022-06-10 DIAGNOSIS — Z1159 Encounter for screening for other viral diseases: Secondary | ICD-10-CM

## 2022-06-10 DIAGNOSIS — I10 Essential (primary) hypertension: Secondary | ICD-10-CM | POA: Diagnosis not present

## 2022-06-10 DIAGNOSIS — K219 Gastro-esophageal reflux disease without esophagitis: Secondary | ICD-10-CM

## 2022-06-10 LAB — COMPREHENSIVE METABOLIC PANEL
ALT: 16 U/L (ref 0–53)
AST: 20 U/L (ref 0–37)
Albumin: 4.6 g/dL (ref 3.5–5.2)
Alkaline Phosphatase: 58 U/L (ref 39–117)
BUN: 25 mg/dL — ABNORMAL HIGH (ref 6–23)
CO2: 25 mEq/L (ref 19–32)
Calcium: 10 mg/dL (ref 8.4–10.5)
Chloride: 100 mEq/L (ref 96–112)
Creatinine, Ser: 0.95 mg/dL (ref 0.40–1.50)
GFR: 79.14 mL/min (ref >60.00)
Glucose, Bld: 102 mg/dL — ABNORMAL HIGH (ref 70–99)
Potassium: 4.9 mEq/L (ref 3.5–5.1)
Sodium: 139 mEq/L (ref 135–145)
Total Bilirubin: 0.6 mg/dL (ref 0.2–1.2)
Total Protein: 7.1 g/dL (ref 6.0–8.3)

## 2022-06-10 LAB — HEMOGLOBIN A1C: Hgb A1c MFr Bld: 5.8 % (ref 4.6–6.5)

## 2022-06-10 LAB — PSA: PSA: 4.9 ng/mL — ABNORMAL HIGH (ref 0.10–4.00)

## 2022-06-10 MED ORDER — LISINOPRIL-HYDROCHLOROTHIAZIDE 20-12.5 MG PO TABS: 1.0000 | ORAL_TABLET | Freq: Every day | ORAL | 1 refills | Status: DC

## 2022-06-10 NOTE — Telephone Encounter (Signed)
Patient forgot to mention to Dr Cherlynn Kaiser on 06/10/22 that he was approved for hearing disability with the [employer]- patient states there is no further action needed from Dr Cherlynn Kaiser but he wanted to leave this comment in his chart.

## 2022-06-10 NOTE — Progress Notes (Signed)
Phone: [phone]   Subjective:  Patient 76 y.o. male presenting for annual physical.  Chief Complaint  Patient presents with   Annual Exam    Annual exam and 6 month follow-up on HTN Fasting    Annual-not exercising. HTN-Pt is on lisinopril/hct 20.12.5.  Bp's running not check.  No ha/dizziness/cp/palp/edema/cough/sob  BPH-doing well on flomax GERD-stable on protonix/pepcid.  No dyphagia, no blood  See problem oriented charting- ROS- ROS: Gen: no fever, chills  Skin: no rash, itching ENT: no ear pain, ear drainage, nasal congestion, rhinorrhea, sinus pressure, sore throat Eyes: no blurry vision, double vision Resp: no cough, wheeze,SOB CV: no CP, palpitations, LE edema,  GI: no heartburn, n/v/d/c, abd pain GU: no dysuria, urgency, frequency, hematuria MSK: no joint pain, myalgias, back pain Neuro: no dizziness, headache, weakness, vertigo Psych: no depression, anxiety, insomnia, SI   The following were reviewed and entered/updated in epic: Past Medical History:  Diagnosis Date   GERD (gastroesophageal reflux disease)    Hypertension    PARESTHESIA 03/16/2010   Qualifier: Diagnosis of  By: Burnice Logan  MD, Doretha Sou    Right acetabular fracture ([place]) 09/15/2012   Unspecified vitamin D deficiency 09/14/2012   Patient Active Problem List   Diagnosis Date Noted   Benign prostatic hyperplasia with nocturia 12/10/2021   Bilateral shoulder pain 08/21/2018   Abnormal x-ray 07/14/2018   Right inguinal hernia 02/23/2014   Hypertension 10/02/2012   Acid reflux 09/07/2012   Past Surgical History:  Procedure Laterality Date   EXTERNAL FIXATION LEG Right 09/05/2012   Procedure: Application of traction bow externally;  Surgeon: Mauri Pole, MD;  Location: [facility];  Service: Orthopedics;  Laterality: Right;   ORIF ACETABULAR FRACTURE Right 09/08/2012   Procedure: OPEN REDUCTION INTERNAL FIXATION (ORIF) ACETABULAR FRACTURE;  Surgeon: Rozanna Box, MD;  Location: [place];  Service:  Orthopedics;  Laterality: Right;   TONSILLECTOMY      Family History  Problem Relation Age of Onset   Cancer Neg Hx     Medications- reviewed and updated Current Outpatient Medications  Medication Sig Dispense Refill   famotidine (PEPCID) 20 MG tablet Take 1 tablet (20 mg total) by mouth 2 (two) times daily with a meal. 60 tablet 2   Multiple Vitamin (MULTI VITAMIN) TABS 1 tablet     pantoprazole (PROTONIX) 40 MG tablet Take 1 tablet (40 mg total) by mouth daily. 90 tablet 3   tamsulosin (FLOMAX) 0.4 MG CAPS capsule Take 1 capsule (0.4 mg total) by mouth daily. 90 capsule 3   lisinopril-hydrochlorothiazide (ZESTORETIC) 20-12.5 MG tablet Take 1 tablet by mouth daily. 90 tablet 1   No current facility-administered medications for this visit.    Allergies-reviewed and updated No Known Allergies  Social History   Social History Narrative   2 grands   Retired-food Academic librarian   Objective  Objective:  BP 130/70   Pulse 77   Temp 98 F (36.7 C) (Temporal)   Ht '5\' 7"'$  (1.702 m)   Wt 157 lb 2 oz (71.3 kg)   SpO2 96%   BMI 24.61 kg/m  Physical Exam  Gen: WDWN NAD HEENT: NCAT, conjunctiva not injected, sclera nonicteric TM WNL B, OP moist, no exudates  NECK:  supple, no thyromegaly, no nodes, no carotid bruits CARDIAC: RRR, S1S2+, no murmur. DP 2+B LUNGS: CTAB. No wheezes ABDOMEN:  BS+, soft, NTND, No HSM, no masses EXT:  no edema MSK: no gross abnormalities. MS 5/5 all 4 NEURO: A&O x3.  CN II-XII  intact.  PSYCH: normal mood. Good eye contact  Skin: forehead-approx 7m raised, um center papule      Assessment and Plan   Health Maintenance counseling: 1. Anticipatory guidance: Patient counseled regarding regular dental exams q6 months, eye exams yearly, avoiding smoking and second hand smoke, limiting alcohol to 2 beverages per day.   2. Risk factor reduction:  Advised patient of need for regular exercise and diet rich in fruits and vegetables to reduce risk of heart  attack and stroke. Exercise- encouraged.   Wt Readings from Last 3 Encounters:  06/10/22 157 lb 2 oz (71.3 kg)  04/01/22 155 lb 6.4 oz (70.5 kg)  03/15/22 154 lb 9.6 oz (70.1 kg)   3. Immunizations/screenings/ancillary studies Immunization History  Administered Date(s) Administered   Fluad Quad(high Dose 65+) 03/08/2021   Influenza-Unspecified 05/24/2020   PFIZER(Purple Top)SARS-COV-2 Vaccination 09/17/2019, 10/13/2019   Pneumococcal Conjugate-13 10/16/2017   Pneumococcal Polysaccharide-23 02/16/2014   Tdap 02/14/2011   Zoster, Live 02/14/2011   Health Maintenance Due  Topic Date Due   Medicare Annual Wellness (AWV)  Never done   Hepatitis C Screening  Never done   Zoster Vaccines- Shingrix (1 of 2) Never done   DTaP/Tdap/Td (2 - Td or Tdap) 02/13/2021   INFLUENZA VACCINE  01/29/2022    4. Prostate cancer screening >55yo - risk factors?  Lab Results  Component Value Date   PSA 3.34 12/27/2021   PSA 2.30 02/04/2014   PSA 2.18 01/11/2013    5. Colon cancer screening:utd 6. Skin cancer screening- Iadvised regular sunscreen use. Denies worrisome, changing, or new skin lesions.    Problem List Items Addressed This Visit       Cardiovascular and Mediastinum   Hypertension   Relevant Medications   lisinopril-hydrochlorothiazide (ZESTORETIC) 20-12.5 MG tablet   Other Relevant Orders   Comprehensive metabolic panel     Digestive   Acid reflux     Other   Benign prostatic hyperplasia with nocturia   Relevant Orders   PSA   Other Visit Diagnoses     Wellness examination    -  Primary   Hyperglycemia       Relevant Orders   Hemoglobin A1c   Encounter for hepatitis C screening test for low risk patient       Relevant Orders   Hepatitis C antibody      Annual wellness-antic guidance.  Advised to get immunizations at pharm. Stay active HNT-chronic.  Well controlled. Cont lisinopril HCT 20/12.5.  check cmp Hyperglycemia-check A1C, glucose.  Work on  diet/exercise BPH-chronic.  Better controlled on tamsulosin 0.'4mg'$  daily-cont GERD-chronic.  Controled on protonix '40mg'$ .  Cont.   Skin lesion-prob sebaceous cyst.  Could be BCC-see derm.  Fu 6 mo  Recommended follow up: [facility] in about 6 months (around 12/10/2022) for htn, bph, gerd. No future appointments.    Lab/Order associations:+ fasting   ICD-10-CM   1. Wellness examination  Z00.00     2. Primary hypertension  I10 Comprehensive metabolic panel    3. Gastroesophageal reflux disease without esophagitis  K21.9     4. Benign prostatic hyperplasia with nocturia  N40.1 PSA   R35.1     5. Hyperglycemia  R73.9 Hemoglobin A1c    6. Encounter for hepatitis C screening test for low risk patient  Z11.59 Hepatitis C antibody      Meds ordered this encounter  Medications   lisinopril-hydrochlorothiazide (ZESTORETIC) 20-12.5 MG tablet    Sig: Take 1 tablet by mouth daily.  Dispense:  90 tablet    Refill:  1    Requesting 1 year supply     Wellington Hampshire, MD

## 2022-06-10 NOTE — Patient Instructions (Addendum)
It was very nice to see you today!  Happy Holidays!  Get covid, shingles, tdap, RSV at pharmacy.  Check blood pressure once/month.  Goal <135/85.    PLEASE NOTE:  If you had any lab tests please let us know if you have not heard back within a few days. You may see your results on MyChart before we have a chance to review them but we will give you a call once they are reviewed by [place]. If we ordered any referrals today, please let us know if you have not heard from their office within the next week.   Please try these tips to maintain a healthy lifestyle:  Eat most of your calories during the day when you are active. Eliminate processed foods including packaged sweets (pies, cakes, cookies), reduce intake of potatoes, white bread, white pasta, and white rice. Look for whole grain options, oat flour or almond flour.  Each meal should contain half fruits/vegetables, one quarter protein, and one quarter carbs (no bigger than a computer mouse).  Cut down on sweet beverages. This includes juice, soda, and sweet tea. Also watch fruit intake, though this is a healthier sweet option, it still contains natural sugar! Limit to 3 servings daily.  Drink at least 1 glass of water with each meal and aim for at least 8 glasses per day  Exercise at least 150 minutes every week.

## 2022-06-10 NOTE — Telephone Encounter (Signed)
FYI

## 2022-06-11 LAB — HEPATITIS C ANTIBODY: Hepatitis C Ab: NONREACTIVE

## 2022-06-12 NOTE — Progress Notes (Signed)
Happy birthday! Labs ok except: 1.  A1C(3 month average of sugars) is elevated.  This is considered PreDiabetes.  Work on diet-decrease sugars and starches and aim for 30 minutes of exercise 5 days/week to prevent progression to diabetes  2.  Psa has increased-refer to urologist

## 2022-06-13 ENCOUNTER — Telehealth: Payer: Self-pay | Admitting: Family Medicine

## 2022-06-13 ENCOUNTER — Other Ambulatory Visit: Payer: Self-pay | Admitting: *Deleted

## 2022-06-13 DIAGNOSIS — R972 Elevated prostate specific antigen [PSA]: Secondary | ICD-10-CM

## 2022-06-13 NOTE — Telephone Encounter (Signed)
Patient wants lab results - would like a phone call from Q. Missed her call.

## 2022-06-13 NOTE — Telephone Encounter (Signed)
Returned call to patient, went over lab results.

## 2022-06-20 ENCOUNTER — Ambulatory Visit (INDEPENDENT_AMBULATORY_CARE_PROVIDER_SITE_OTHER): Payer: Medicare Other

## 2022-06-20 VITALS — Wt 157.0 lb

## 2022-06-20 DIAGNOSIS — Z0000 Encounter for general adult medical examination without abnormal findings: Secondary | ICD-10-CM | POA: Diagnosis not present

## 2022-06-20 NOTE — Patient Instructions (Signed)
Leslie Cox , Thank you for taking time to come for your Medicare Wellness Visit. I appreciate your ongoing commitment to your health goals. Please review the following plan we discussed and let me know if I can assist you in the future.   These are the goals we discussed:  Goals      Patient Stated     None at this time         This is a list of the screening recommended for you and due dates:  Health Maintenance  Topic Date Due   Zoster (Shingles) Vaccine (1 of 2) Never done   DTaP/Tdap/Td vaccine (2 - Td or Tdap) 02/13/2021   COVID-19 Vaccine (3 - 2023-24 season) 06/26/2022*   Medicare Annual Wellness Visit  06/21/2023   Colon Cancer Screening  12/26/2027   Pneumonia Vaccine  Completed   Flu Shot  Completed   Hepatitis C Screening: USPSTF Recommendation to screen - Ages 18-79 yo.  Completed   HPV Vaccine  Aged Out  *Topic was postponed. The date shown is not the original due date.    Advanced directives: Please bring a copy of your health care power of attorney and living will to the office at your convenience.  Conditions/risks identified: none at this time   Next appointment: Follow up in one year for your annual wellness visit.   Preventive Care 74 Years and Older, Male  Preventive care refers to lifestyle choices and visits with your health care provider that can promote health and wellness. What does preventive care include? A yearly physical exam. This is also called an annual well check. Dental exams once or twice a year. Routine eye exams. Ask your health care provider how often you should have your eyes checked. Personal lifestyle choices, including: Daily care of your teeth and gums. Regular physical activity. Eating a healthy diet. Avoiding tobacco and drug use. Limiting alcohol use. Practicing safe sex. Taking low doses of aspirin every day. Taking vitamin and mineral supplements as recommended by your health care provider. What happens during an annual  well check? The services and screenings done by your health care provider during your annual well check will depend on your age, overall health, lifestyle risk factors, and family history of disease. Counseling  Your health care provider may ask you questions about your: Alcohol use. Tobacco use. Drug use. Emotional well-being. Home and relationship well-being. Sexual activity. Eating habits. History of falls. Memory and ability to understand (cognition). Work and work Statistician. Screening  You may have the following tests or measurements: Height, weight, and BMI. Blood pressure. Lipid and cholesterol levels. These may be checked every 5 years, or more frequently if you are over 63 years old. Skin check. Lung cancer screening. You may have this screening every year starting at age 78 if you have a 30-pack-year history of smoking and currently smoke or have quit within the past 15 years. Fecal occult blood test (FOBT) of the stool. You may have this test every year starting at age 80. Flexible sigmoidoscopy or colonoscopy. You may have a sigmoidoscopy every 5 years or a colonoscopy every 10 years starting at age 21. Prostate cancer screening. Recommendations will vary depending on your family history and other risks. Hepatitis C blood test. Hepatitis B blood test. Sexually transmitted disease (STD) testing. Diabetes screening. This is done by checking your blood sugar (glucose) after you have not eaten for a while (fasting). You may have this done every 1-3 years. Abdominal aortic aneurysm (  AAA) screening. You may need this if you are a current or former smoker. Osteoporosis. You may be screened starting at age 91 if you are at high risk. Talk with your health care provider about your test results, treatment options, and if necessary, the need for more tests. Vaccines  Your health care provider may recommend certain vaccines, such as: Influenza vaccine. This is recommended every  year. Tetanus, diphtheria, and acellular pertussis (Tdap, Td) vaccine. You may need a Td booster every 10 years. Zoster vaccine. You may need this after age 13. Pneumococcal 13-valent conjugate (PCV13) vaccine. One dose is recommended after age 15. Pneumococcal polysaccharide (PPSV23) vaccine. One dose is recommended after age 34. Talk to your health care provider about which screenings and vaccines you need and how often you need them. This information is not intended to replace advice given to you by your health care provider. Make sure you discuss any questions you have with your health care provider. Document Released: 07/14/2015 Document Revised: 03/06/2016 Document Reviewed: 04/18/2015 Elsevier Interactive Patient Education  2017 [place] Prevention in the Home Falls can cause injuries. They can happen to people of all ages. There are many things you can do to make your home safe and to help prevent falls. What can I do on the outside of my home? Regularly fix the edges of walkways and driveways and fix any cracks. Remove anything that might make you trip as you walk through a door, such as a raised step or threshold. Trim any bushes or trees on the path to your home. Use bright outdoor lighting. Clear any walking paths of anything that might make someone trip, such as rocks or tools. Regularly check to see if handrails are loose or broken. Make sure that both sides of any steps have handrails. Any raised decks and porches should have guardrails on the edges. Have any leaves, snow, or ice cleared regularly. Use sand or salt on walking paths during winter. Clean up any spills in your garage right away. This includes oil or grease spills. What can I do in the bathroom? Use night lights. Install grab bars by the toilet and in the tub and shower. Do not use towel bars as grab bars. Use non-skid mats or decals in the tub or shower. If you need to sit down in the shower, use a  plastic, non-slip stool. Keep the floor dry. Clean up any water that spills on the floor as soon as it happens. Remove soap buildup in the tub or shower regularly. Attach bath mats securely with double-sided non-slip rug tape. Do not have throw rugs and other things on the floor that can make you trip. What can I do in the bedroom? Use night lights. Make sure that you have a light by your bed that is easy to reach. Do not use any sheets or blankets that are too big for your bed. They should not hang down onto the floor. Have a firm chair that has side arms. You can use this for support while you get dressed. Do not have throw rugs and other things on the floor that can make you trip. What can I do in the kitchen? Clean up any spills right away. Avoid walking on wet floors. Keep items that you use a lot in easy-to-reach places. If you need to reach something above you, use a strong step stool that has a grab bar. Keep electrical cords out of the way. Do not use floor polish  or wax that makes floors slippery. If you must use wax, use non-skid floor wax. Do not have throw rugs and other things on the floor that can make you trip. What can I do with my stairs? Do not leave any items on the stairs. Make sure that there are handrails on both sides of the stairs and use them. Fix handrails that are broken or loose. Make sure that handrails are as long as the stairways. Check any carpeting to make sure that it is firmly attached to the stairs. Fix any carpet that is loose or worn. Avoid having throw rugs at the top or bottom of the stairs. If you do have throw rugs, attach them to the floor with carpet tape. Make sure that you have a light switch at the top of the stairs and the bottom of the stairs. If you do not have them, ask someone to add them for you. What else can I do to help prevent falls? Wear shoes that: Do not have high heels. Have rubber bottoms. Are comfortable and fit you  well. Are closed at the toe. Do not wear sandals. If you use a stepladder: Make sure that it is fully opened. Do not climb a closed stepladder. Make sure that both sides of the stepladder are locked into place. Ask someone to hold it for you, if possible. Clearly mark and make sure that you can see: Any grab bars or handrails. First and last steps. Where the edge of each step is. Use tools that help you move around (mobility aids) if they are needed. These include: Canes. Walkers. Scooters. Crutches. Turn on the lights when you go into a dark area. Replace any light bulbs as soon as they burn out. Set up your furniture so you have a clear path. Avoid moving your furniture around. If any of your floors are uneven, fix them. If there are any pets around you, be aware of where they are. Review your medicines with your doctor. Some medicines can make you feel dizzy. This can increase your chance of falling. Ask your doctor what other things that you can do to help prevent falls. This information is not intended to replace advice given to you by your health care provider. Make sure you discuss any questions you have with your health care provider. Document Released: 04/13/2009 Document Revised: 11/23/2015 Document Reviewed: 07/22/2014 Elsevier Interactive Patient Education  2017 [employer].

## 2022-06-20 NOTE — Progress Notes (Signed)
I connected with  Kennon Rounds on 06/20/22 by a audio enabled telemedicine application and verified that I am speaking with the correct person using two identifiers.  Patient Location: Home  Provider Location: Office/Clinic  I discussed the limitations of evaluation and management by telemedicine. The patient expressed understanding and agreed to proceed.   Subjective:   Leslie Cox is a 76 y.o. male who presents for Medicare Annual/Subsequent preventive examination.  Review of Systems     Cardiac Risk Factors include: advanced age (>68mn, >>38women);hypertension;male gender     Objective:    Today's Vitals   06/20/22 1031  Weight: 157 lb (71.2 kg)   Body mass index is 24.59 kg/m.     06/20/2022   10:34 AM 08/26/2018    8:00 AM 09/14/2012    4:30 PM 09/06/2012    3:00 AM  Advanced Directives  Does Patient Have a Medical Advance Directive? Yes Yes Patient has advance directive, copy not in chart Patient has advance directive, copy not in chart  Type of Advance Directive H[place] will Living will Living will Living will  Does patient want to make changes to medical advance directive?  No - Patient declined    Copy of HSociety Hillin Chart? No - copy requested  Copy requested from family Copy requested from family  Pre-existing out of facility DNR order (yellow form or pink MOST form)   No No    Current Medications (verified) Outpatient Encounter Medications as of 06/20/2022  Medication Sig   famotidine (PEPCID) 20 MG tablet Take 1 tablet (20 mg total) by mouth 2 (two) times daily with a meal.   lisinopril-hydrochlorothiazide (ZESTORETIC) 20-12.5 MG tablet Take 1 tablet by mouth daily.   Multiple Vitamin (MULTI VITAMIN) TABS 1 tablet   pantoprazole (PROTONIX) 40 MG tablet Take 1 tablet (40 mg total) by mouth daily.   tamsulosin (FLOMAX) 0.4 MG CAPS capsule Take 1 capsule (0.4 mg total) by mouth daily.   No facility-administered  encounter medications on file as of 06/20/2022.    Allergies (verified) Patient has no known allergies.   History: Past Medical History:  Diagnosis Date   GERD (gastroesophageal reflux disease)    Hypertension    PARESTHESIA 03/16/2010   Qualifier: Diagnosis of  By: KBurnice Logan MD, PDoretha Sou   Right [place]abular fracture ([facility] 09/15/2012   Unspecified vitamin D deficiency 09/14/2012   Past Surgical History:  Procedure Laterality Date   EXTERNAL FIXATION LEG Right 09/05/2012   Procedure: Application of traction bow externally;  Surgeon: MMauri Pole MD;  [facility]  Service: Orthopedics;  Laterality: Right;   ORIF ACETABULAR FRACTURE Right 09/08/2012   Procedure: OPEN REDUCTION INTERNAL FIXATION (ORIF) ACETABULAR FRACTURE;  Surgeon: MRozanna Box MD;  Location: [facility]  Service: Orthopedics;  Laterality: Right;   TONSILLECTOMY     Family History  Problem Relation Age of Onset   Cancer Neg Hx    Social History   Socioeconomic History   Marital status: Married    Spouse name: Not on file   Number of children: 1   Years of education: Not on file   Highest education level: Not on file  Occupational History   Occupation: cSurveyor, [employer]  Occupation: cSurveyor, quantity [employer]  Tobacco Use   Smoking status: Never   Smokeless tobacco: Never  Substance and Sexual Activity   Alcohol use: No   Drug  use: No   Sexual activity: Not on file  Other Topics Concern   Not on file  Social History Narrative   2 grands   Retired-food Academic librarian   Social Determinants of Health   Financial Resource Strain: Low Risk  (06/20/2022)   Overall Financial Resource Strain (CARDIA)    Difficulty of Paying Living Expenses: Not hard at all  Food Insecurity: No Food Insecurity (06/20/2022)   Hunger Vital Sign    Worried About Running Out of Food in the Last Year: Never true    Ran Out of Food in the Last Year: Never true  Transportation Needs: No  Transportation Needs (06/20/2022)   PRAPARE - Hydrologist (Medical): No    Lack of Transportation (Non-Medical): No  Physical Activity: Insufficiently Active (06/20/2022)   Exercise Vital Sign    Days of Exercise per Week: 3 days    Minutes of Exercise per Session: 20 min  Stress: No Stress Concern Present (06/20/2022)   [place]    Feeling of Stress : Not at all  Social Connections: Moderately Isolated (06/20/2022)   Social Connection and Isolation Panel [NHANES]    Frequency of Communication with Friends and Family: Once a week    Frequency of Social Gatherings with Friends and Family: More than three times a week    Attends Religious Services: Never    Marine scientist or Organizations: No    Attends Music therapist: Never    Marital Status: Married    Tobacco Counseling Counseling given: Not Answered   Clinical Intake:  Pre-visit preparation completed: Yes  Pain : No/denies pain     BMI - recorded: 24.59 Nutritional Status: BMI of 19-24  Normal Nutritional Risks: None Diabetes: No  How often do you need to have someone help you when you read instructions, pamphlets, or other written materials from your doctor or pharmacy?: 1 - Never  Diabetic?no  Interpreter Needed?: No  Information entered by :: Charlott Rakes, LPN   Activities of Daily Living    06/20/2022   10:35 AM  In your present state of health, do you have any difficulty performing the following activities:  Hearing? 0  Vision? 0  Difficulty concentrating or making decisions? 0  Walking or climbing stairs? 0  Dressing or bathing? 0  Doing errands, shopping? 0  Preparing Food and eating ? N  Using the Toilet? N  In the past six months, have you accidently leaked urine? N  Do you have problems with loss of bowel control? N  Managing your Medications? N  Managing your Finances? N   Housekeeping or managing your Housekeeping? N    Patient Care Team: Tawnya Crook, MD as PCP - General (Family Medicine) Gerda Diss, DO as Consulting Physician (Sports Medicine)  Indicate any recent Medical Services you may have received from other than [facility] providers in the past year (date may be approximate).     Assessment:   This is a routine wellness examination for Leslie Cox.  Hearing/Vision screen Hearing Screening - Comments:: Pt wears hearing aids  Vision Screening - Comments:: Pt follows up with Dr Marica Otter   Dietary issues and exercise activities discussed: Current Exercise Habits: Home exercise routine, Type of exercise: walking, Time (Minutes): 20, Frequency (Times/Week): 3, Weekly Exercise (Minutes/Week): 60   Goals Addressed             This Visit's Progress  Patient Stated       None at this time        Depression Screen    06/20/2022   10:33 AM 06/10/2022    9:00 AM 06/08/2021    8:01 AM 03/08/2021    5:09 PM 07/09/2018   10:00 AM 10/16/2017    8:06 AM 09/08/2015    2:28 PM  PHQ 2/9 Scores  PHQ - 2 Score 0 0 0 1 0 0 0  PHQ- 9 Score 0 '7 2 2       '$ Fall Risk    06/20/2022   10:35 AM 06/10/2022    8:54 AM 12/10/2021   10:26 AM 06/08/2021    8:01 AM 06/08/2021    8:00 AM  Fall Risk   Falls in the past year? 0 0 0 0 0  Number falls in past yr: 0 0 0 0 0  Injury with Fall? 0 0 0 0 0  Risk for fall due to : Impaired vision No Fall Risks No Fall Risks    Follow up Falls prevention discussed Falls evaluation completed Falls evaluation completed      [place]:  Any stairs in or around the home? No  If so, are there any without handrails? No  Home free of loose throw rugs in walkways, pet beds, electrical cords, etc? Yes  Adequate lighting in your home to reduce risk of falls? Yes   ASSISTIVE DEVICES UTILIZED TO PREVENT FALLS:  Life alert? No  Use of a cane, walker or w/c? No  Grab bars in the  bathroom? No  Shower chair or bench in shower? No  Elevated toilet seat or a handicapped toilet? No   TIMED UP AND GO:  Was the test performed? No .   Cognitive Function:        06/20/2022   10:36 AM  6CIT Screen  What Year? 0 points  What month? 0 points  What time? 0 points  Count back from 20 0 points  Months in reverse 4 points  Repeat phrase 0 points  Total Score 4 points    Immunizations Immunization History  Administered Date(s) Administered   Fluad Quad(high Dose 65+) 03/08/2021, 06/10/2022   Influenza-Unspecified 05/24/2020   PFIZER(Purple Top)SARS-COV-2 Vaccination 09/17/2019, 10/13/2019   Pneumococcal Conjugate-13 10/16/2017   Pneumococcal Polysaccharide-23 02/16/2014   Tdap 02/14/2011   Zoster, Live 02/14/2011    TDAP status: Due, Education has been provided regarding the importance of this vaccine. Advised may receive this vaccine at local pharmacy or Health Dept. Aware to provide a copy of the vaccination record if obtained from local pharmacy or Health Dept. Verbalized acceptance and understanding.  Flu Vaccine status: Up to date  Pneumococcal vaccine status: Up to date  Covid-19 vaccine status: Completed vaccines  Qualifies for Shingles Vaccine? Yes   Zostavax completed No   Shingrix Completed?: No.    Education has been provided regarding the importance of this vaccine. Patient has been advised to call insurance company to determine out of pocket expense if they have not yet received this vaccine. Advised may also receive vaccine at local pharmacy or Health Dept. Verbalized acceptance and understanding.  Screening Tests Health Maintenance  Topic Date Due   Zoster Vaccines- Shingrix (1 of 2) Never done   DTaP/Tdap/Td (2 - Td or Tdap) 02/13/2021   COVID-19 Vaccine (3 - 2023-24 season) 06/26/2022 (Originally 03/01/2022)   Medicare Annual Wellness (AWV)  06/21/2023   COLONOSCOPY (Pts 45-27yr Insurance coverage  will need to be confirmed)  12/26/2027    Pneumonia Vaccine 43+ Years old  Completed   INFLUENZA VACCINE  Completed   Hepatitis C Screening  Completed   HPV VACCINES  Aged Out    Health Maintenance  Health Maintenance Due  Topic Date Due   Zoster Vaccines- Shingrix (1 of 2) Never done   DTaP/Tdap/Td (2 - Td or Tdap) 02/13/2021    Colorectal cancer screening: Type of screening: Colonoscopy. Completed 12/25/17. Repeat every 10 years   Additional Screening:  Hepatitis C Screening:  Completed 06/10/22  Vision Screening: Recommended annual ophthalmology exams for early detection of glaucoma and other disorders of the eye. Is the patient up to date with their annual eye exam?  Yes  Who is the provider or what is the name of the office in which the patient attends annual eye exams? Dr Marica Otter  If pt is not established with a provider, would they like to be referred to a provider to establish care? No .   Dental Screening: Recommended annual dental exams for proper oral hygiene  Community Resource Referral / Chronic Care Management: CRR required this visit?  No   CCM required this visit?  No      Plan:     I have personally reviewed and noted the following in the patient's chart:   Medical and social history Use of alcohol, tobacco or illicit drugs  Current medications and supplements including opioid prescriptions. Patient is not currently taking opioid prescriptions. Functional ability and status Nutritional status Physical activity Advanced directives List of other physicians Hospitalizations, surgeries, and ER visits in previous 12 months Vitals Screenings to include cognitive, depression, and falls Referrals and appointments  In addition, I have reviewed and discussed with patient certain preventive protocols, quality metrics, and best practice recommendations. A written personalized care plan for preventive services as well as general preventive health recommendations were provided to patient.      Willette Brace, LPN   [id number]   Nurse Notes: none

## 2022-06-27 ENCOUNTER — Other Ambulatory Visit: Payer: Self-pay | Admitting: Internal Medicine

## 2022-07-04 ENCOUNTER — Encounter: Payer: Self-pay | Admitting: Family Medicine

## 2022-07-05 ENCOUNTER — Other Ambulatory Visit: Payer: Self-pay | Admitting: *Deleted

## 2022-07-05 DIAGNOSIS — K219 Gastro-esophageal reflux disease without esophagitis: Secondary | ICD-10-CM

## 2022-07-05 DIAGNOSIS — N401 Enlarged prostate with lower urinary tract symptoms: Secondary | ICD-10-CM

## 2022-07-05 MED ORDER — TAMSULOSIN HCL 0.4 MG PO CAPS: 0.4000 mg | ORAL_CAPSULE | Freq: Every day | ORAL | 3 refills | Status: DC

## 2022-07-05 MED ORDER — PANTOPRAZOLE SODIUM 40 MG PO TBEC: 40.0000 mg | DELAYED_RELEASE_TABLET | Freq: Every day | ORAL | 3 refills | Status: DC

## 2022-07-11 DIAGNOSIS — R350 Frequency of micturition: Secondary | ICD-10-CM | POA: Diagnosis not present

## 2022-07-11 DIAGNOSIS — R351 Nocturia: Secondary | ICD-10-CM | POA: Diagnosis not present

## 2022-07-11 DIAGNOSIS — R972 Elevated prostate specific antigen [PSA]: Secondary | ICD-10-CM | POA: Diagnosis not present

## 2022-07-11 DIAGNOSIS — N401 Enlarged prostate with lower urinary tract symptoms: Secondary | ICD-10-CM | POA: Diagnosis not present

## 2022-07-12 ENCOUNTER — Ambulatory Visit (INDEPENDENT_AMBULATORY_CARE_PROVIDER_SITE_OTHER): Payer: No Typology Code available for payment source | Admitting: Family

## 2022-07-12 ENCOUNTER — Encounter: Payer: Self-pay | Admitting: Family

## 2022-07-12 VITALS — BP 138/70 | HR 86 | Temp 97.8°F | Ht 67.0 in | Wt 154.1 lb

## 2022-07-12 DIAGNOSIS — M533 Sacrococcygeal disorders, not elsewhere classified: Secondary | ICD-10-CM | POA: Diagnosis not present

## 2022-07-12 NOTE — Progress Notes (Signed)
Patient ID: Leslie Cox, male    DOB: [birth year], 77 y.o.   MRN: [id number]  Chief Complaint  Patient presents with   Back Pain    Pt c/o lower back pain for a week, Has tried aleve which did not help.      HPI:      Back/buttock pain:  reports pain specifically on his tailbone. reports having lumbar surgery years ago. denies any recent falls or injuries. Reports pain with sitting, standing up from chair or getting out of bed. Hurts to lie on his side in bed. Reports taking Aleve has not helped. Denies continuous pain.    Assessment & Plan:  1. Coccyx pain - advised pt ok to pursue MRI if urology orders for him as his pain could be originating from his lumbar spine or pelvis. Advised the best treatment for now is to not apply pressure to his tailbone. Advised going to medical supply store and purchasing a coccyx cushion and use in every chair including car. Also can apply heating pad for up to 66mn tid. OK to continue Tylenol or Aleve prn.  Subjective:    Outpatient Medications Prior to Visit  Medication Sig Dispense Refill   famotidine (PEPCID) 20 MG tablet Take 1 tablet (20 mg total) by mouth 2 (two) times daily with a meal. 60 tablet 2   lisinopril-hydrochlorothiazide (ZESTORETIC) 20-12.5 MG tablet Take 1 tablet by mouth daily. 90 tablet 1   Multiple Vitamin (MULTI VITAMIN) TABS 1 tablet     pantoprazole (PROTONIX) 40 MG tablet Take 1 tablet (40 mg total) by mouth daily. 90 tablet 3   tamsulosin (FLOMAX) 0.4 MG CAPS capsule Take 1 capsule (0.4 mg total) by mouth daily. 90 capsule 3   No facility-administered medications prior to visit.   Past Medical History:  Diagnosis Date   GERD (gastroesophageal reflux disease)    Hypertension    PARESTHESIA 03/16/2010   Qualifier: Diagnosis of  By: KBurnice Logan MD, PDoretha Sou   Right acetabular fracture ([facility] 09/15/2012   Unspecified vitamin D deficiency 09/14/2012   Past Surgical History:  Procedure Laterality Date   EXTERNAL FIXATION  LEG Right 09/05/2012   Procedure: Application of traction bow externally;  Surgeon: MMauri Pole MD;  [place] [facility]  Service: Orthopedics;  Laterality: Right;   ORIF ACETABULAR FRACTURE Right 09/08/2012   Procedure: OPEN REDUCTION INTERNAL FIXATION (ORIF) ACETABULAR FRACTURE;  Surgeon: MRozanna Box MD;  [place] [facility]  Service: Orthopedics;  Laterality: Right;   TONSILLECTOMY     No Known Allergies    Objective:    Physical Exam Vitals and nursing note reviewed.  Constitutional:      General: He is not in acute distress.    Appearance: Normal appearance.  HENT:     Head: Normocephalic.  Cardiovascular:     Rate and Rhythm: Normal rate and regular rhythm.  Pulmonary:     Effort: Pulmonary effort is normal.     Breath sounds: Normal breath sounds.  Musculoskeletal:        General: Normal range of motion.     Cervical back: Normal range of motion.     Lumbar back: Bony tenderness (on coccyx bone) present. No edema. Normal range of motion.  Skin:    General: Skin is warm and dry.  Neurological:     Mental Status: He is alert and oriented to person, place, and time.  Psychiatric:        Mood and Affect: Mood normal.  BP 138/70 (BP Location: Left Arm, Patient Position: Sitting, Cuff Size: Large)   Pulse 86   Temp 97.8 F (36.6 C) (Temporal)   Ht '5\' 7"'$  (1.702 m)   Wt 154 lb 2 oz (69.9 kg)   SpO2 98%   BMI 24.14 kg/m  Wt Readings from Last 3 Encounters:  07/12/22 154 lb 2 oz (69.9 kg)  06/20/22 157 lb (71.2 kg)  06/10/22 157 lb 2 oz (71.3 kg)       Jeanie Sewer, NP

## 2022-07-12 NOTE — Patient Instructions (Signed)
It was very nice to see you today!   Look for a coccyx seat cushion at [employer].  Use this in every seat you sit in including your car to relieve the pressure on your bottom.    PLEASE NOTE:  If you had any lab tests please let us know if you have not heard back within a few days. You may see your results on MyChart before we have a chance to review them but we will give you a call once they are reviewed by [place]. If we ordered any referrals today, please let us know if you have not heard from their office within the next week.

## 2022-07-16 ENCOUNTER — Other Ambulatory Visit: Payer: Self-pay | Admitting: Urology

## 2022-07-16 DIAGNOSIS — R972 Elevated prostate specific antigen [PSA]: Secondary | ICD-10-CM

## 2022-08-10 ENCOUNTER — Ambulatory Visit
Admission: RE | Admit: 2022-08-10 | Discharge: 2022-08-10 | Disposition: A | Discharge status: Home / Self Care | Payer: No Typology Code available for payment source | Source: Ambulatory Visit | Attending: Urology

## 2022-08-10 ENCOUNTER — Other Ambulatory Visit: Payer: Self-pay | Admitting: Urology

## 2022-08-10 DIAGNOSIS — R972 Elevated prostate specific antigen [PSA]: Secondary | ICD-10-CM

## 2022-08-12 ENCOUNTER — Ambulatory Visit
Admission: RE | Admit: 2022-08-12 | Discharge: 2022-08-12 | Discharge status: Home / Self Care | Payer: No Typology Code available for payment source | Source: Ambulatory Visit | Attending: Urology

## 2022-08-12 DIAGNOSIS — R972 Elevated prostate specific antigen [PSA]: Secondary | ICD-10-CM

## 2022-08-12 MED ORDER — GADOPICLENOL 0.5 MMOL/ML IV SOLN
7.5000 mL | Freq: Once | INTRAVENOUS | Status: AC | PRN
  Administered 2022-08-12: 7.5 mL via INTRAVENOUS

## 2022-08-15 DIAGNOSIS — R351 Nocturia: Secondary | ICD-10-CM | POA: Diagnosis not present

## 2022-08-15 DIAGNOSIS — R972 Elevated prostate specific antigen [PSA]: Secondary | ICD-10-CM | POA: Diagnosis not present

## 2022-08-15 DIAGNOSIS — R3912 Poor urinary stream: Secondary | ICD-10-CM | POA: Diagnosis not present

## 2022-08-15 DIAGNOSIS — N401 Enlarged prostate with lower urinary tract symptoms: Secondary | ICD-10-CM | POA: Diagnosis not present

## 2022-08-22 DIAGNOSIS — H524 Presbyopia: Secondary | ICD-10-CM | POA: Diagnosis not present

## 2022-09-18 DIAGNOSIS — R972 Elevated prostate specific antigen [PSA]: Secondary | ICD-10-CM | POA: Diagnosis not present

## 2022-09-18 DIAGNOSIS — C61 Malignant neoplasm of prostate: Secondary | ICD-10-CM | POA: Diagnosis not present

## 2022-10-02 ENCOUNTER — Encounter: Payer: Self-pay | Admitting: Family Medicine

## 2022-10-24 ENCOUNTER — Other Ambulatory Visit: Payer: Self-pay | Admitting: *Deleted

## 2022-10-24 DIAGNOSIS — K219 Gastro-esophageal reflux disease without esophagitis: Secondary | ICD-10-CM

## 2022-10-24 DIAGNOSIS — N401 Enlarged prostate with lower urinary tract symptoms: Secondary | ICD-10-CM

## 2022-10-24 MED ORDER — TAMSULOSIN HCL 0.4 MG PO CAPS: 0.4000 mg | ORAL_CAPSULE | Freq: Every day | ORAL | 3 refills | Status: DC

## 2022-10-24 MED ORDER — PANTOPRAZOLE SODIUM 40 MG PO TBEC: 40.0000 mg | DELAYED_RELEASE_TABLET | Freq: Every day | ORAL | 3 refills | Status: DC

## 2022-10-24 MED ORDER — LISINOPRIL-HYDROCHLOROTHIAZIDE 20-12.5 MG PO TABS: 1.0000 | ORAL_TABLET | Freq: Every day | ORAL | 3 refills | Status: DC

## 2022-12-09 ENCOUNTER — Encounter: Payer: Self-pay | Admitting: Family Medicine

## 2022-12-09 ENCOUNTER — Encounter: Payer: Self-pay | Admitting: Physician Assistant

## 2022-12-09 ENCOUNTER — Ambulatory Visit (INDEPENDENT_AMBULATORY_CARE_PROVIDER_SITE_OTHER): Payer: Medicare Other | Admitting: Family Medicine

## 2022-12-09 VITALS — BP 120/66 | HR 77 | Temp 98.3°F | Resp 16 | Ht 67.0 in | Wt 155.1 lb

## 2022-12-09 DIAGNOSIS — Z79899 Other long term (current) drug therapy: Secondary | ICD-10-CM

## 2022-12-09 DIAGNOSIS — K219 Gastro-esophageal reflux disease without esophagitis: Secondary | ICD-10-CM

## 2022-12-09 DIAGNOSIS — C61 Malignant neoplasm of prostate: Secondary | ICD-10-CM | POA: Diagnosis not present

## 2022-12-09 DIAGNOSIS — R194 Change in bowel habit: Secondary | ICD-10-CM

## 2022-12-09 DIAGNOSIS — R1010 Upper abdominal pain, unspecified: Secondary | ICD-10-CM

## 2022-12-09 DIAGNOSIS — I10 Essential (primary) hypertension: Secondary | ICD-10-CM

## 2022-12-09 DIAGNOSIS — R739 Hyperglycemia, unspecified: Secondary | ICD-10-CM | POA: Diagnosis not present

## 2022-12-09 DIAGNOSIS — R351 Nocturia: Secondary | ICD-10-CM

## 2022-12-09 DIAGNOSIS — N401 Enlarged prostate with lower urinary tract symptoms: Secondary | ICD-10-CM

## 2022-12-09 LAB — CBC WITH DIFFERENTIAL/PLATELET
Basophils Absolute: 0 10*3/uL (ref 0.0–0.1)
Basophils Relative: 0.6 % (ref 0.0–3.0)
Eosinophils Absolute: 0.2 10*3/uL (ref 0.0–0.7)
Eosinophils Relative: 3.5 % (ref 0.0–5.0)
HCT: 45 % (ref 39.0–52.0)
Hemoglobin: 15.1 g/dL (ref 13.0–17.0)
Lymphocytes Relative: 17.7 % (ref 12.0–46.0)
Lymphs Abs: 1 10*3/uL (ref 0.7–4.0)
MCHC: 33.7 g/dL (ref 30.0–36.0)
MCV: 91.8 fl (ref 78.0–100.0)
Monocytes Absolute: 0.4 10*3/uL (ref 0.1–1.0)
Monocytes Relative: 7.5 % (ref 3.0–12.0)
Neutro Abs: 4 10*3/uL (ref 1.4–7.7)
Neutrophils Relative %: 70.7 % (ref 43.0–77.0)
Platelets: 252 10*3/uL (ref 150.0–400.0)
RBC: 4.9 Mil/uL (ref 4.22–5.81)
RDW: 13 % (ref 11.5–15.5)
WBC: 5.6 10*3/uL (ref 4.0–10.5)

## 2022-12-09 LAB — COMPREHENSIVE METABOLIC PANEL
ALT: 17 U/L (ref 0–53)
AST: 19 U/L (ref 0–37)
Albumin: 4.6 g/dL (ref 3.5–5.2)
Alkaline Phosphatase: 57 U/L (ref 39–117)
BUN: 20 mg/dL (ref 6–23)
CO2: 27 mEq/L (ref 19–32)
Calcium: 9.6 mg/dL (ref 8.4–10.5)
Chloride: 98 mEq/L (ref 96–112)
Creatinine, Ser: 0.92 mg/dL (ref 0.40–1.50)
GFR: 81.96 mL/min (ref >60.00)
Glucose, Bld: 101 mg/dL — ABNORMAL HIGH (ref 70–99)
Potassium: 4.6 mEq/L (ref 3.5–5.1)
Sodium: 137 mEq/L (ref 135–145)
Total Bilirubin: 0.7 mg/dL (ref 0.2–1.2)
Total Protein: 7.1 g/dL (ref 6.0–8.3)

## 2022-12-09 LAB — LIPID PANEL
Cholesterol: 204 mg/dL — ABNORMAL HIGH (ref 0–200)
HDL: 41.6 mg/dL (ref >39.00)
LDL Cholesterol: 124 mg/dL — ABNORMAL HIGH (ref 0–99)
NonHDL: 161.93
Total CHOL/HDL Ratio: 5
Triglycerides: 189 mg/dL — ABNORMAL HIGH (ref 0.0–149.0)
VLDL: 37.8 mg/dL (ref 0.0–40.0)

## 2022-12-09 LAB — LIPASE: Lipase: 47 U/L (ref 11.0–59.0)

## 2022-12-09 LAB — MAGNESIUM: Magnesium: 2.2 mg/dL (ref 1.5–2.5)

## 2022-12-09 LAB — AMYLASE: Amylase: 55 U/L (ref 27–131)

## 2022-12-09 LAB — VITAMIN B12: Vitamin B-12: 468 pg/mL (ref 211–911)

## 2022-12-09 LAB — PSA: PSA: 1.97 ng/mL (ref 0.10–4.00)

## 2022-12-09 LAB — HEMOGLOBIN A1C: Hgb A1c MFr Bld: 5.6 % (ref 4.6–6.5)

## 2022-12-09 IMAGING — DX DG CHEST 2V
2 series · 2 of 2 positions shown · non-contrast
Comparison: None

CLINICAL DATA: Cough

EXAM:
CHEST - 2 VIEW

[chest pa]
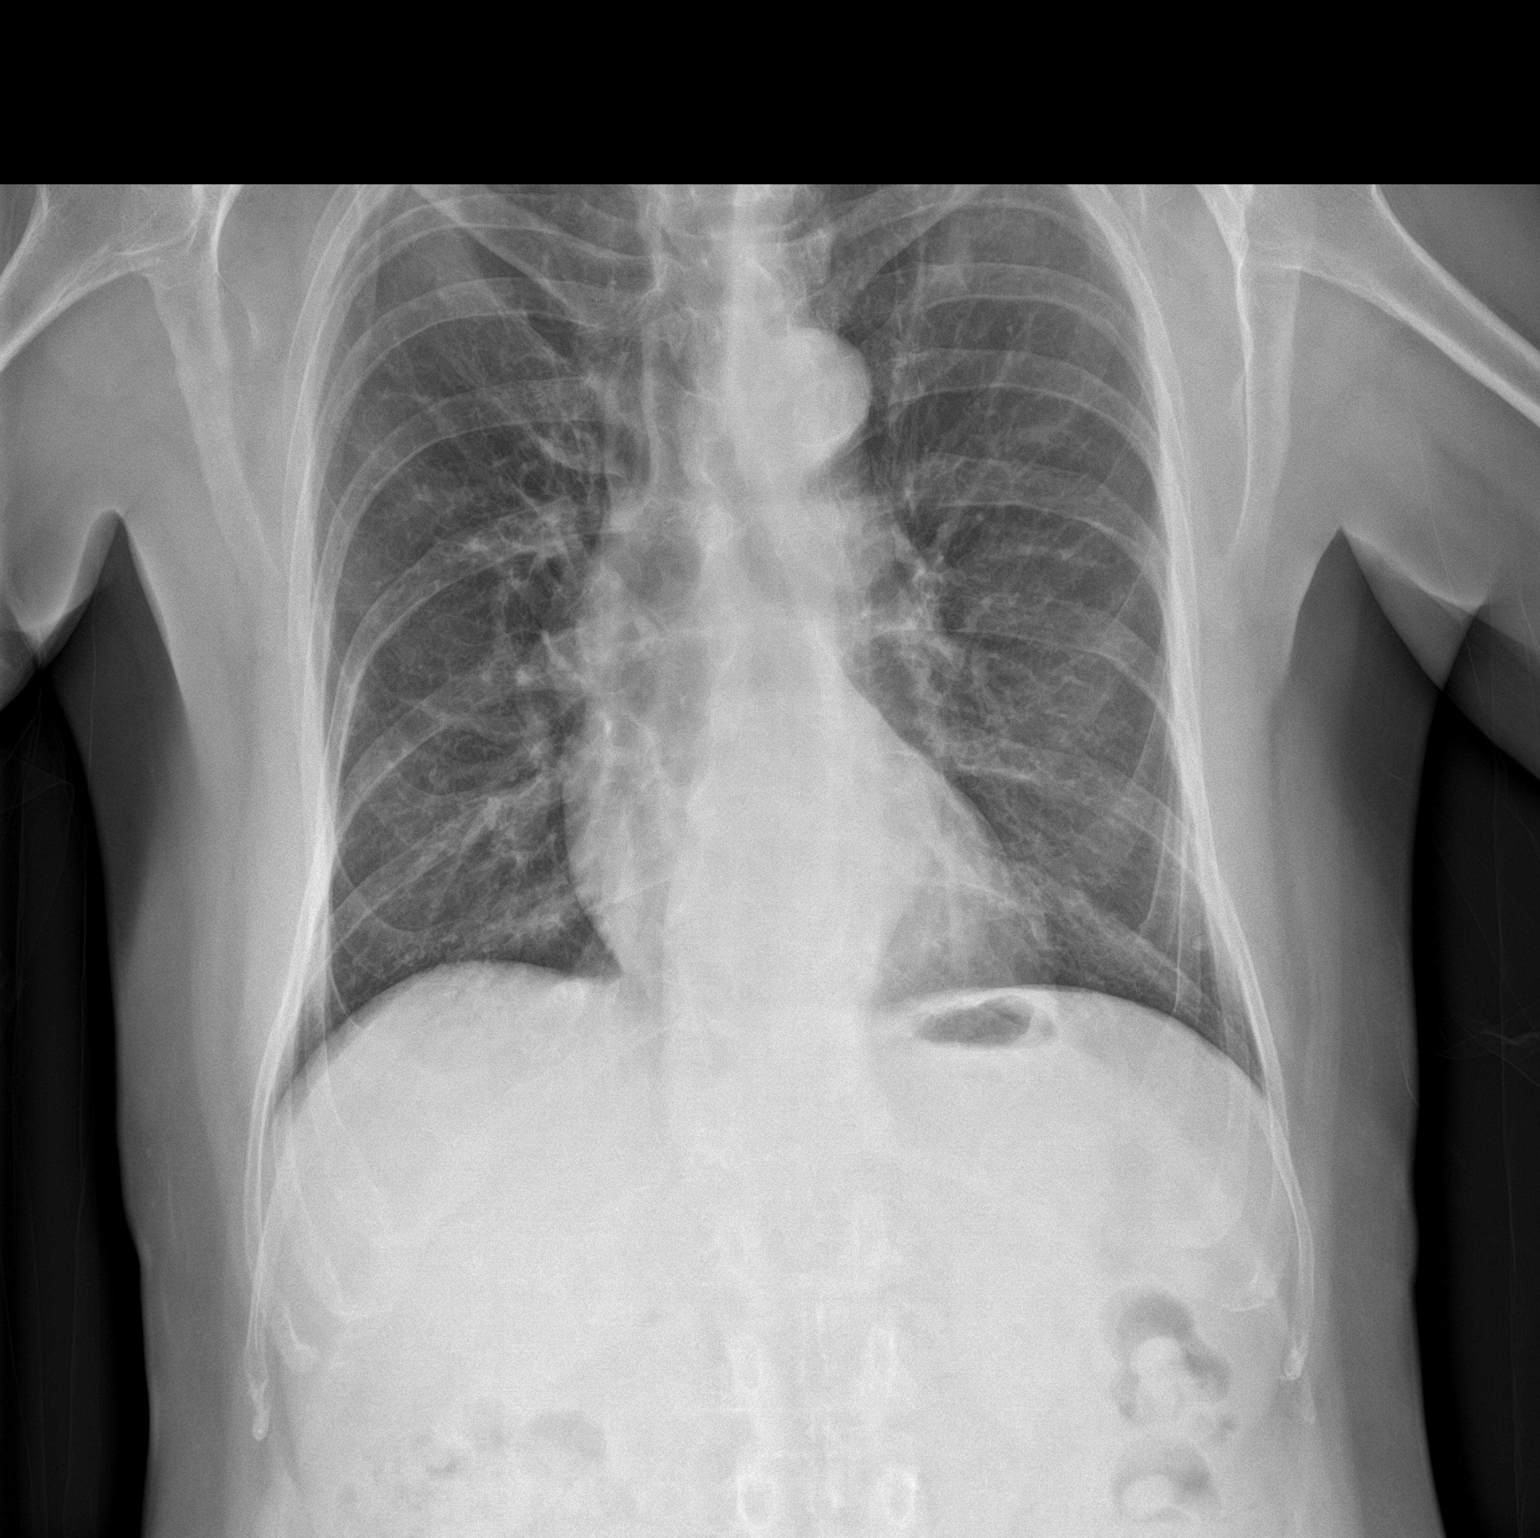

[chest lat]
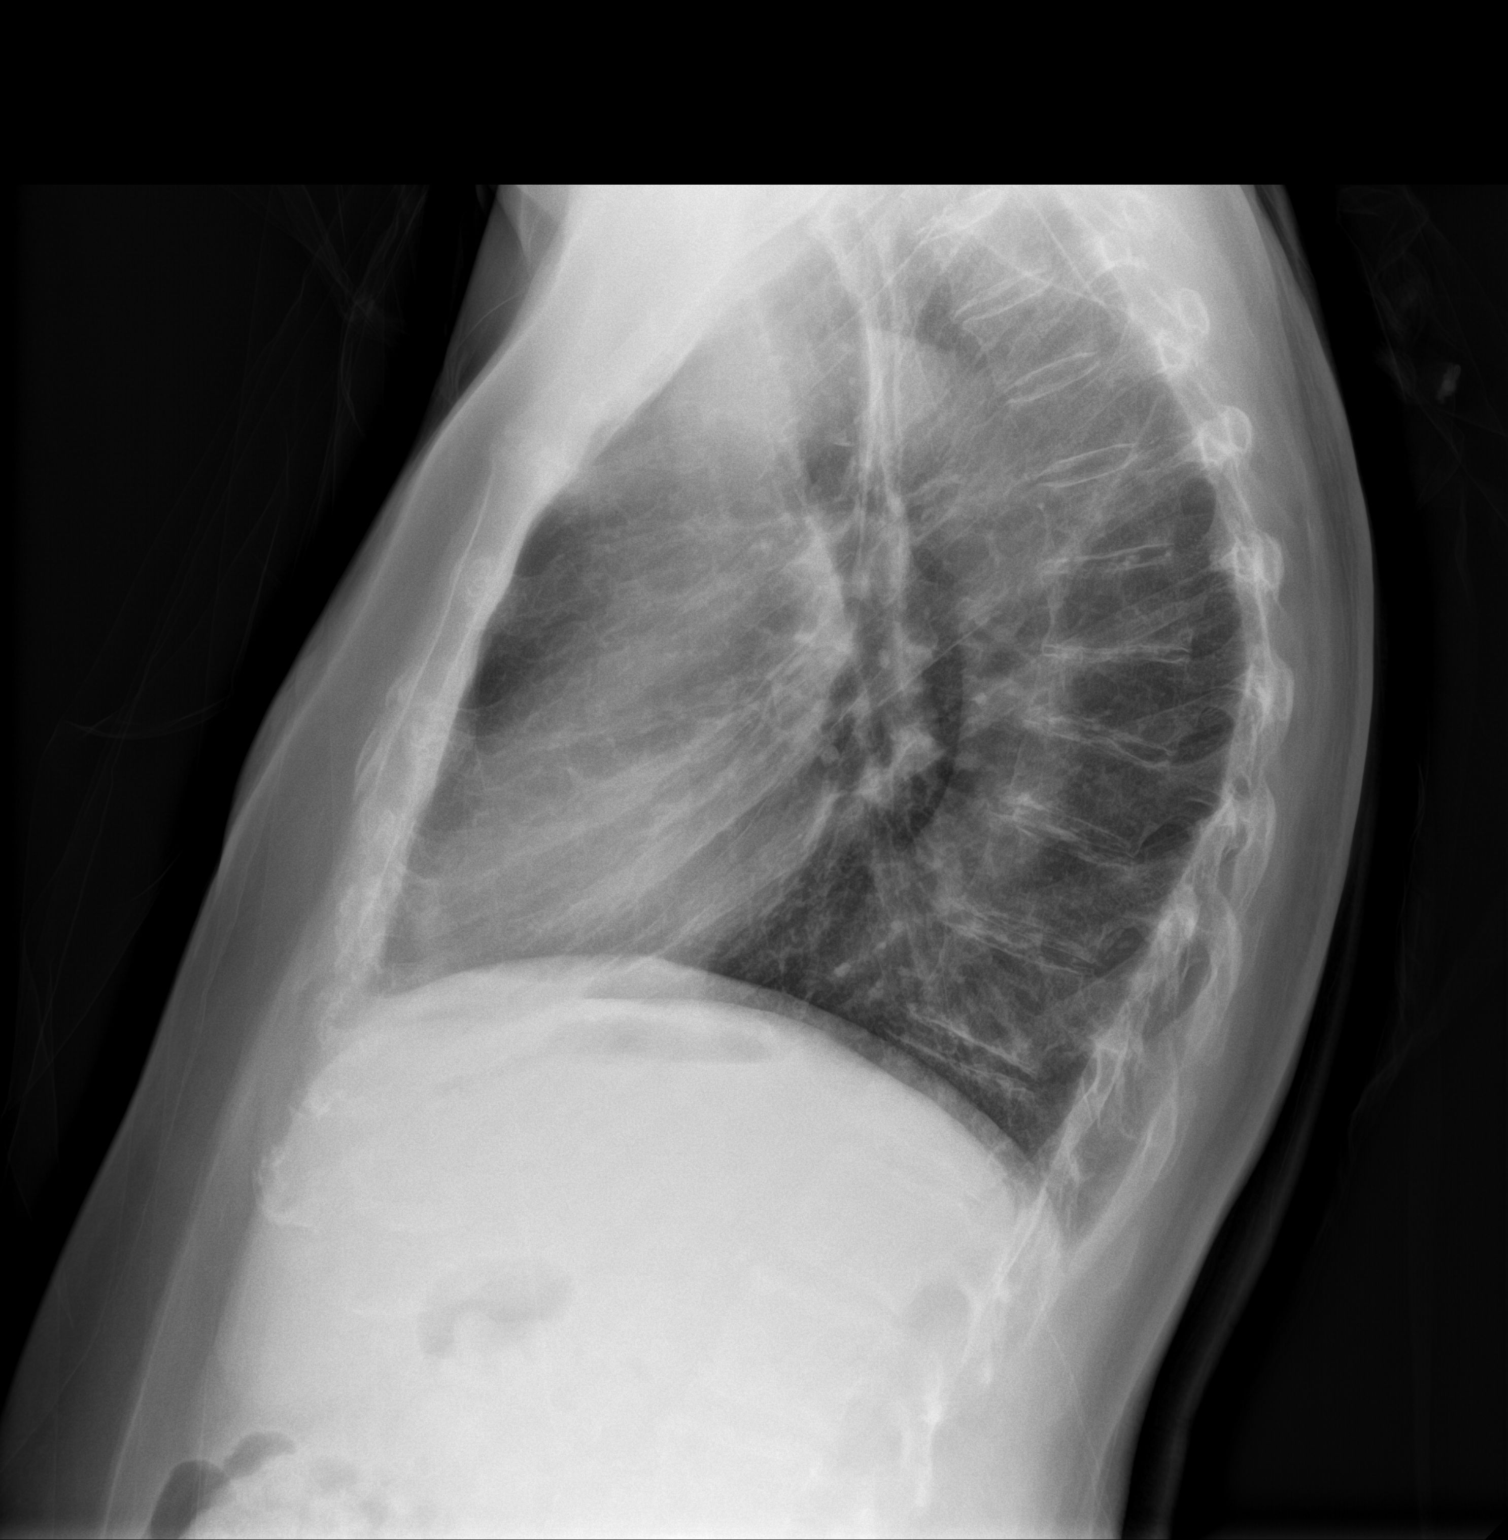

[2 of 2 positions shown; findings below may reference images not displayed]

FINDINGS: Cardiac shadow is within normal limits. Aortic calcifications are
seen. Lungs are well. No focal infiltrate or sizable effusion is
seen. No bony abnormality noted.
IMPRESSION: No active cardiopulmonary disease.

## 2022-12-09 NOTE — Assessment & Plan Note (Signed)
Chronic.  Not controlled.  Continue protonix 40 mg daily.  Refer GI

## 2022-12-09 NOTE — Patient Instructions (Signed)
It was very nice to see you today!  Referring to gastroenterology for the bowel changes and heartburn.   Call [phone] to schedule.   PLEASE NOTE:  If you had any lab tests please let us know if you have not heard back within a few days. You may see your results on MyChart before we have a chance to review them but we will give you a call once they are reviewed by [place]. If we ordered any referrals today, please let us know if you have not heard from their office within the next week.   Please try these tips to maintain a healthy lifestyle:  Eat most of your calories during the day when you are active. Eliminate processed foods including packaged sweets (pies, cakes, cookies), reduce intake of potatoes, white bread, white pasta, and white rice. Look for whole grain options, oat flour or almond flour.  Each meal should contain half fruits/vegetables, one quarter protein, and one quarter carbs (no bigger than a computer mouse).  Cut down on sweet beverages. This includes juice, soda, and sweet tea. Also watch fruit intake, though this is a healthier sweet option, it still contains natural sugar! Limit to 3 servings daily.  Drink at least 1 glass of water with each meal and aim for at least 8 glasses per day  Exercise at least 150 minutes every week.

## 2022-12-09 NOTE — Assessment & Plan Note (Signed)
Chronic.  Controlled.  Continue zestoretic 20/12.5

## 2022-12-09 NOTE — Assessment & Plan Note (Signed)
Chronic.  Controlled.  Work on diet/exercise.  Check A1C

## 2022-12-09 NOTE — Progress Notes (Signed)
Subjective:     Patient ID: Leslie Cox, male    DOB: 05-Sep-1945, 77 y.o.   MRN: [id number]  Chief Complaint  Patient presents with   Medical Management of Chronic Issues    6 month follow-up on HTN, GERD, BPH Fasting Soft/mussy stool     HPI  HTN-Pt is on zestoretic 20/12.5.  Bp's running 120/70's.  No ha/dizziness/cp/palp/edema/cough/sob  GERD on protonix 40 mg. Still having some breakthru.  No dysphagia.  No bleeding.  BPH-on tamsulosin 0.4 mg.  Seeing urological-just diagnosis w/prostate cancer-watching  Health Maintenance Due  Topic Date Due   DTaP/Tdap/Td (2 - Td or Tdap) 02/13/2021    Past Medical History:  Diagnosis Date   GERD (gastroesophageal reflux disease)    Hypertension    PARESTHESIA 03/16/2010   Qualifier: Diagnosis of  By: Amador Cunas  MD, Janett Labella    Right acetabular fracture ([facility]) 09/15/2012   Unspecified vitamin D deficiency 09/14/2012    Past Surgical History:  Procedure Laterality Date   EXTERNAL FIXATION LEG Right 09/05/2012   Procedure: Application of traction bow externally;  Surgeon: Shelda Pal, MD;  Location[facility] OR;  Service: Orthopedics;  Laterality: Right;   ORIF ACETABULAR FRACTURE Right 09/08/2012   Procedure: OPEN REDUCTION INTERNAL FIXATION (ORIF) ACETABULAR FRACTURE;  Surgeon: Budd Palmer, MD;  Location: [facility] OR;  Service: Orthopedics;  Laterality: Right;   TONSILLECTOMY       Current Outpatient Medications:    finasteride (PROSCAR) 5 MG tablet, Take 5 mg by mouth daily., Disp: , Rfl:    lisinopril-hydrochlorothiazide (ZESTORETIC) 20-12.5 MG tablet, Take 1 tablet by mouth daily., Disp: 90 tablet, Rfl: 3   Multiple Vitamin (MULTI VITAMIN) TABS, 1 tablet, Disp: , Rfl:    pantoprazole (PROTONIX) 40 MG tablet, Take 1 tablet (40 mg total) by mouth daily., Disp: 90 tablet, Rfl: 3   tamsulosin (FLOMAX) 0.4 MG CAPS capsule, Take 1 capsule (0.4 mg total) by mouth daily., Disp: 90 capsule, Rfl: 3  No Known Allergies ROS  neg/noncontributory except as noted HPI/below Stools "mushy" most of the time for about 1 month.  More "muddy" and no form.  Occasional abdomen pain lower abdomen.  Last colonoscopy 2019-Dr. Russella Dar Last week(s), went to [facility] for hearing test-left ear bad.  Right not good either.  Will get hearing aids.  Will see ENT next week(s) through [place].  Some low back pain-intermitt for few days. Some neck pain        Objective:     BP 120/66   Pulse 77   Temp 98.3 F (36.8 C) (Temporal)   Resp 16   Ht 5\' 7"  (1.702 m)   Wt 155 lb 2 oz (70.4 kg)   SpO2 99%   BMI 24.30 kg/m  Wt Readings from Last 3 Encounters:  12/09/22 155 lb 2 oz (70.4 kg)  07/12/22 154 lb 2 oz (69.9 kg)  06/20/22 157 lb (71.2 kg)    Physical Exam   Gen: WDWN NAD HEENT: NCAT, conjunctiva not injected, sclera nonicteric NECK:  supple, no thyromegaly, no nodes, no carotid bruits CARDIAC: RRR, S1S2+, no murmur. DP 2+B LUNGS: CTAB. No wheezes ABDOMEN:  BS+, soft, mildly tender mid epi, No HSM, no masses EXT:  no edema MSK: no gross abnormalities.  NEURO: A&O x3.  CN II-XII intact.  PSYCH: normal mood. Good eye contact     Assessment & Plan:  Primary hypertension Assessment & Plan: Chronic.  Controlled.  Continue zestoretic 20/12.5  Orders: -  Comprehensive metabolic panel -     Lipid panel  Gastroesophageal reflux disease without esophagitis Assessment & Plan: Chronic.  Not controlled.  Continue protonix 40 mg daily.  Refer GI  Orders: -     Ambulatory referral to Gastroenterology  Benign prostatic hyperplasia with nocturia  Hyperglycemia Assessment & Plan: Chronic.  Controlled.  Work on diet/exercise.  Check A1C  Orders: -     Comprehensive metabolic panel -     Hemoglobin A1c  High risk medication use -     Comprehensive metabolic panel -     CBC with Differential/Platelet -     Vitamin B12 -     Magnesium  Prostate cancer [facility]) Assessment & Plan: New diagnosis.  Seeing urologist.   Watching.  On finasteride 5 mg and tamsulosin 0.4 mg daily.  Patient requesting PSA  Orders: -     PSA  Bowel habit changes -     Ambulatory referral to Gastroenterology  Pain of upper abdomen -     Lipase -     Amylase  Stool changes-? From uncontrolled GERD, other-to gastroenterology.   Pain upper abdomen-check amylase/lipase  Return in about 6 months (around 06/10/2023) for HTN.  Angelena Sole, MD

## 2022-12-09 NOTE — Assessment & Plan Note (Signed)
New diagnosis.  Seeing urologist.  Watching.  On finasteride 5 mg and tamsulosin 0.4 mg daily.  Patient requesting PSA

## 2022-12-10 NOTE — Progress Notes (Signed)
Prostate number better from the medications.  Labs stable except Your cholesterol levels are elevated.  Work on low cholesterol and lower carbs/sugars diet and  get exercise to try to lower your cholesterol.

## 2023-01-09 DIAGNOSIS — H6121 Impacted cerumen, right ear: Secondary | ICD-10-CM | POA: Diagnosis not present

## 2023-01-09 DIAGNOSIS — H903 Sensorineural hearing loss, bilateral: Secondary | ICD-10-CM | POA: Diagnosis not present

## 2023-01-09 DIAGNOSIS — H6122 Impacted cerumen, left ear: Secondary | ICD-10-CM | POA: Diagnosis not present

## 2023-01-21 ENCOUNTER — Ambulatory Visit: Payer: Medicare Other | Admitting: Physician Assistant

## 2023-01-22 NOTE — Progress Notes (Signed)
01/23/2023 Leslie Cox [id number] 01/07/1946  Referring provider: Jeani Sow, MD Primary GI doctor: Dr. Russella Dar  ASSESSMENT AND PLAN:   Gastroesophageal reflux disease without esophagitis Lifestyle changes discussed, avoid NSAIDS, ETOH Continue protonix once daily, well controlled  Change in bowel habits with increasing gas No AB pain, no hematochezia, no bloating Has BM every 2-3 days, has mushy stool hard to clean Add on citracel/benefiber FODMAP, avoid milk first,  and lifestyle changes discussed Information given, can add gas X If not improving can consider endoscopic evaluation Follow up 3 months  Benign polyps 12/25/2017 colonoscopy Dr. Russella Dar for screening purposes adequate prep after extensive lavage and suctioning 4 mm polyp transverse colon, mild left colon diverticulosis, internal hemorrhoids polyp benign, recall 10 years with extensive prep.  Patient Care Team: Jeani Sow, MD as PCP - General (Family Medicine) Andrena Mews, DO as Consulting Physician (Sports Medicine)  HISTORY OF PRESENT ILLNESS: 77 y.o. male with a past medical history of GERD, vitamin D deficiency, hypertension, BPH following with Dr. Alvester Morin for prostate CA and others listed below presents for evaluation of GERD, change in bowel habits.   12/25/2017 colonoscopy Dr. Russella Dar for screening purposes adequate prep after extensive lavage and suctioning 4 mm polyp transverse colon, mild left colon diverticulosis, internal hemorrhoids polyp benign, recall 10 years with extensive prep. 07/10/2021 CT abdomen pelvis without contrast for right-sided abdominal pain status post hernia repair showed small fat-containing left inguinal hernia, plate and screw fixation prior right pelvic fracture, aortic atherosclerosis, normal liver, normal gallbladder, normal pancreas, normal spleen, normal stomach and bowel.  12/09/2022 labs reviewed show no anemia no leukocytosis, normal kidney and liver. Patient is  on Protonix 40 mg once daily, finasteride and Flomax.  He states his Bm's have changed over last 6 months.  Patient has a BM every 3-4 days.   Can be formed to liquid/mush when he does have a BM. States he has had increase gas and this is bothering him the most.   Patient denies hematochezia.  Denis AB pain or bloating.  Denies changes in appetite, unintentional weight loss.  Patient denies GERD, as long as he takes his protonix 40 mg daily.  Patient denies dysphagia, nausea, vomiting, melena.  No new medications.   He denies blood thinner use.  He denies NSAID use.  He denies ETOH use.   He denies tobacco use.  He denies drug use.    He  reports that he has never smoked. He has never used smokeless tobacco. He reports that he does not drink alcohol and does not use drugs.  RELEVANT LABS AND IMAGING: CBC    Component Value Date/Time   WBC 5.6 12/09/2022 0938   RBC 4.90 12/09/2022 0938   HGB 15.1 12/09/2022 0938   HCT 45.0 12/09/2022 0938   PLT 252.0 12/09/2022 0938   MCV 91.8 12/09/2022 0938   MCH 30.4 09/15/2012 0545   MCHC 33.7 12/09/2022 0938   RDW 13.0 12/09/2022 0938   LYMPHSABS 1.0 12/09/2022 0938   MONOABS 0.4 12/09/2022 0938   EOSABS 0.2 12/09/2022 0938   BASOSABS 0.0 12/09/2022 0938   Recent Labs    12/09/22 0938  HGB 15.1    CMP     Component Value Date/Time   NA 137 12/09/2022 0938   K 4.6 12/09/2022 0938   CL 98 12/09/2022 0938   CO2 27 12/09/2022 0938   GLUCOSE 101 (H) 12/09/2022 0938   BUN 20 12/09/2022 0938   CREATININE 0.92  12/09/2022 0938   CALCIUM 9.6 12/09/2022 0938   PROT 7.1 12/09/2022 0938   ALBUMIN 4.6 12/09/2022 0938   AST 19 12/09/2022 0938   ALT 17 12/09/2022 0938   ALKPHOS 57 12/09/2022 0938   BILITOT 0.7 12/09/2022 0938   GFRNONAA >90 09/21/2012 0730   GFRAA >90 09/21/2012 0730      Latest Ref Rng & Units 12/09/2022    9:38 AM 06/10/2022    9:19 AM 12/10/2021   11:15 AM  Hepatic Function  Total Protein 6.0 - 8.3 g/dL 7.1   7.1  7.1   Albumin 3.5 - 5.2 g/dL 4.6  4.6  4.4   AST 0 - 37 U/L 19  20  17    ALT 0 - 53 U/L 17  16  18    Alk Phosphatase 39 - 117 U/L 57  58  58   Total Bilirubin 0.2 - 1.2 mg/dL 0.7  0.6  0.6       Current Medications:    Current Outpatient Medications (Cardiovascular):    lisinopril-hydrochlorothiazide (ZESTORETIC) 20-12.5 MG tablet, Take 1 tablet by mouth daily.     Current Outpatient Medications (Other):    finasteride (PROSCAR) 5 MG tablet, Take 5 mg by mouth daily.   Multiple Vitamin (MULTI VITAMIN) TABS, 1 tablet   pantoprazole (PROTONIX) 40 MG tablet, Take 1 tablet (40 mg total) by mouth daily.   tamsulosin (FLOMAX) 0.4 MG CAPS capsule, Take 1 capsule (0.4 mg total) by mouth daily.  Medical History:  Past Medical History:  Diagnosis Date   GERD (gastroesophageal reflux disease)    Hypertension    PARESTHESIA 03/16/2010   Qualifier: Diagnosis of  By: Amador Cunas  MD, Janett Labella    Right acetabular fracture [facility]) 09/15/2012   Unspecified vitamin D deficiency 09/14/2012   Allergies: No Known Allergies   Surgical History:  He  has a past surgical history that includes Tonsillectomy; External fixation leg (Right, 09/05/2012); and ORIF acetabular fracture (Right, 09/08/2012). Family History:  His family history includes Breast cancer in his mother.  REVIEW OF SYSTEMS  : All other systems reviewed and negative except where noted in the History of Present Illness.  PHYSICAL EXAM: BP 128/72   Pulse 87   Ht 5\' 6"  (1.676 m)   Wt 153 lb (69.4 kg)   BMI 24.69 kg/m  General Appearance: Well nourished, in no apparent distress. Head:   Normocephalic and atraumatic. Eyes:  sclerae anicteric,conjunctive pink  Respiratory: Respiratory effort normal, BS equal bilaterally without rales, rhonchi, wheezing. Cardio: RRR with no MRGs. Peripheral pulses intact.  Abdomen: Soft,  Obese ,active bowel sounds. No tenderness . Without guarding and Without rebound. No masses. Rectal: Not  evaluated Musculoskeletal: Full ROM, Normal gait. Without edema. Skin:  Dry and intact without significant lesions or rashes Neuro: Alert and  oriented x4;  No focal deficits. Psych:  Cooperative. Normal mood and affect.    Doree Albee, PA-C 3:12 PM

## 2023-01-23 ENCOUNTER — Ambulatory Visit: Payer: Medicare Other | Admitting: Physician Assistant

## 2023-01-23 ENCOUNTER — Encounter: Payer: Self-pay | Admitting: Physician Assistant

## 2023-01-23 VITALS — BP 128/72 | HR 87 | Ht 66.0 in | Wt 153.0 lb

## 2023-01-23 DIAGNOSIS — R143 Flatulence: Secondary | ICD-10-CM | POA: Diagnosis not present

## 2023-01-23 DIAGNOSIS — R194 Change in bowel habit: Secondary | ICD-10-CM

## 2023-01-23 DIAGNOSIS — K219 Gastro-esophageal reflux disease without esophagitis: Secondary | ICD-10-CM | POA: Diagnosis not present

## 2023-01-23 NOTE — Patient Instructions (Addendum)
We have scheduled you a follow up with Quentin Mulling on 04/16/2023 at 11:00am   First do a trial off milk/lactose products if you use them.  Add fiber like benefiber or citracel once a day Increase activity   Abdominal bloating and discomfort may be due to intestinal sensitivity or symptoms of irritable bowel syndrome. To relieve symptoms, avoid:  Broccoli  Baked beans  Cabbage  Carbonated drinks  Cauliflower  Chewing gum  Hard candy Abdominal distention resulting from weak abdominal muscles:  Is better in the morning  Gets worse as the day progresses  Is relieved by lying down Flatulence is gas created through bacterial action in the bowel and passed rectally. Keep in mind that:  10-18 passages per day are normal  Primary gases are harmless and odorless  Noticeable smells are trace gases related to food intake Foods to AVOID that are likely to form gas include:  Milk, dairy products, and medications that contain lactose--If your body doesn't produce the enzyme (lactase) to break it down.  Certain vegetables--baked beans, cauliflower, broccoli, cabbage  Certain starches--wheat, oats, corn, potatoes. Rice is a good substitute. Identify offending foods. Reduce or eliminate these gas-forming foods from your diet. Can look at the FODMAP diet.      FODMAP stands for fermentable oligo-, di-, mono-saccharides and polyols (1). These are the scientific terms used to classify groups of carbs that are difficult for our body to digest and that are notorious for triggering digestive symptoms like bloating, gas, loose stools and stomach pain.   You can try low FODMAP diet  - start with eliminating just one column at a time that you feel may be a trigger for you. - the table at the very bottom contains foods that are low in FODMAPs   Sometimes trying to eliminate the FODMAP's from your diet is difficult or tricky, if you are stuggling with trying to do the elimination diet you can try an  enzyme.  There is a food enzymes that you sprinkle in or on your food that helps break down the FODMAP. You can read more about the enzyme by going to this site: https://fodzyme.com/  Diverticulosis Diverticulosis is a condition that develops when small pouches (diverticula) form in the wall of the large intestine (colon). The colon is where water is absorbed and stool (feces) is formed. The pouches form when the inside layer of the colon pushes through weak spots in the outer layers of the colon. You may have a few pouches or many of them. The pouches usually do not cause problems unless they become inflamed or infected. When this happens, the condition is called diverticulitis- this is left lower quadrant pain, diarrhea, fever, chills, nausea or vomiting.  If this occurs please call the office or go to the hospital. Sometimes these patches without inflammation can also have painless bleeding associated with them, if this happens please call the office or go to the hospital. Preventing constipation and increasing fiber can help reduce diverticula and prevent complications. Even if you feel you have a high-fiber diet, suggest getting on Benefiber or Cirtracel 2 times daily.  _______________________________________________________  If your blood pressure at your visit was 140/90 or greater, please contact your primary care physician to follow up on this.  _______________________________________________________  If you are age 75 or older, your body mass index should be between 23-30. Your Body mass index is 24.69 kg/m. If this is out of the aforementioned range listed, please consider follow up with your Primary  Care Provider.  If you are age 68 or younger, your body mass index should be between 19-25. Your Body mass index is 24.69 kg/m. If this is out of the aformentioned range listed, please consider follow up with your Primary Care Provider.    ________________________________________________________  The [facility] providers would like to encourage you to use [facility] to communicate with providers for non-urgent requests or questions.  Due to long hold times on the telephone, sending your provider a message by [facility] may be a faster and more efficient way to get a response.  Please allow 48 business hours for a response.  Please remember that this is for non-urgent requests.  _______________________________________________________ It was a pleasure to see you today!  Thank you for trusting me with your gastrointestinal care!

## 2023-03-20 DIAGNOSIS — H903 Sensorineural hearing loss, bilateral: Secondary | ICD-10-CM | POA: Diagnosis not present

## 2023-03-20 DIAGNOSIS — H6123 Impacted cerumen, bilateral: Secondary | ICD-10-CM | POA: Diagnosis not present

## 2023-03-20 DIAGNOSIS — S00411A Abrasion of right ear, initial encounter: Secondary | ICD-10-CM | POA: Diagnosis not present

## 2023-03-24 ENCOUNTER — Other Ambulatory Visit: Payer: Self-pay | Admitting: *Deleted

## 2023-03-24 ENCOUNTER — Encounter: Payer: Self-pay | Admitting: Family Medicine

## 2023-03-24 DIAGNOSIS — R9089 Other abnormal findings on diagnostic imaging of central nervous system: Secondary | ICD-10-CM

## 2023-04-10 DIAGNOSIS — R3912 Poor urinary stream: Secondary | ICD-10-CM | POA: Diagnosis not present

## 2023-04-16 ENCOUNTER — Ambulatory Visit: Payer: Medicare Other | Admitting: Physician Assistant

## 2023-04-17 ENCOUNTER — Encounter: Payer: Self-pay | Admitting: Physician Assistant

## 2023-04-17 ENCOUNTER — Ambulatory Visit: Payer: Medicare Other | Admitting: Physician Assistant

## 2023-04-17 VITALS — BP 110/60 | HR 85 | Ht 66.0 in | Wt 152.0 lb

## 2023-04-17 DIAGNOSIS — K219 Gastro-esophageal reflux disease without esophagitis: Secondary | ICD-10-CM

## 2023-04-17 DIAGNOSIS — R350 Frequency of micturition: Secondary | ICD-10-CM | POA: Diagnosis not present

## 2023-04-17 DIAGNOSIS — R143 Flatulence: Secondary | ICD-10-CM

## 2023-04-17 DIAGNOSIS — R194 Change in bowel habit: Secondary | ICD-10-CM

## 2023-04-17 NOTE — Patient Instructions (Addendum)
Continue off milk/lactose products if you use them.  Add fiber like benefiber once a day  Can do trial of IBGard which is over the counter for AB pain- Take 1-2 capsules once a day for maintence or twice a day during a flare  Will add on probiotic like Align for 1-3 months- this is found over the counter  Follow up with your primary care about your urine issues, may want to consider pelvic floor samples.   If this does not help, we can add on linzess 72 mcg daily in the morning, we would try samples first.   Abdominal bloating and discomfort may be due to intestinal sensitivity or symptoms of irritable bowel syndrome. To relieve symptoms, avoid:  Broccoli  Baked beans  Cabbage  Carbonated drinks  Cauliflower  Chewing gum  Hard candy Abdominal distention resulting from weak abdominal muscles:  Is better in the morning  Gets worse as the day progresses  Is relieved by lying down Flatulence is gas created through bacterial action in the bowel and passed rectally. Keep in mind that:  10-18 passages per day are normal  Primary gases are harmless and odorless  Noticeable smells are trace gases related to food intake Foods to AVOID that are likely to form gas include:  Milk, dairy products, and medications that contain lactose--If your body doesn't produce the enzyme (lactase) to break it down.  Certain vegetables--baked beans, cauliflower, broccoli, cabbage  Certain starches--wheat, oats, corn, potatoes. Rice is a good substitute. Identify offending foods. Reduce or eliminate these gas-forming foods from your diet. Can look at the FODMAP diet.

## 2023-04-17 NOTE — Progress Notes (Signed)
04/17/2023 [name] [id number] 1945/12/19  Referring provider: Jeani Sow, MD Primary GI doctor: Dr. Russella Dar  ASSESSMENT AND PLAN:   Change in bowel habits with increasing gas No AB pain, no hematochezia, no bloating Has BM every 2-3 days, formed stools, occ loose or hard Doing better without milk, continue this, add on given IBGard, will do probiotic If this is not helpful can consider linzess 72 for constipation, and can consider pelvic floor PT Follow up 3-4 months No need for endoscopic evaluation at this time.   Benign polyps 12/25/2017 colonoscopy Dr. Russella Dar for screening purposes adequate prep after extensive lavage and suctioning 4 mm polyp transverse colon, mild left colon diverticulosis, internal hemorrhoids polyp benign, recall 10 years with extensive prep.  Gastroesophageal reflux disease without esophagitis Lifestyle changes discussed, avoid NSAIDS, ETOH Continue protonix once daily, well controlled  Patient Care Team: Jeani Sow, MD as PCP - General (Family Medicine) Andrena Mews, DO as Consulting Physician (Sports Medicine)  HISTORY OF PRESENT ILLNESS: 77 y.o. male with a past medical history of GERD, vitamin D deficiency, hypertension, BPH following with Dr. Alvester Morin for prostate CA and others listed below presents for evaluation of GERD, change in bowel habits.   12/25/2017 colonoscopy Dr. Russella Dar for screening purposes adequate prep after extensive lavage and suctioning 4 mm polyp transverse colon, mild left colon diverticulosis, internal hemorrhoids polyp benign, recall 10 years with extensive prep. 07/10/2021 CT abdomen pelvis without contrast for right-sided abdominal pain status post hernia repair showed small fat-containing left inguinal hernia, plate and screw fixation prior right pelvic fracture, aortic atherosclerosis, normal liver, normal gallbladder, normal pancreas, normal spleen, normal stomach and bowel. 01/23/2023 patient seen in the  office for GERD and change in bowel habits.  Continued on Protonix 40 mg once daily, patient had no abdominal pain no hematochezia no bloating supposed to add on fiber, FODMAP avoid milk added Gas-X and will consider endoscopic evaluation pending results.  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  Patient presents for follow-up. He did dietary changes, avoiding ice cream/milk/broccoli and has had improvement in his stools, still having increase gas.  He has BM every 2-3 days.  He states he has formed stools the majority of the time, occ mushy stools.  He continues to have increase gas, flatulence but denies distention/bloating with it, denies AB pain.  Patient denies hematochezia.  Denies changes in appetite, unintentional weight loss.  Patient denies GERD, as long as he takes his protonix 40 mg daily.  Patient denies dysphagia, nausea, vomiting, melena.  No new medications. No ABX.   He denies blood thinner use.  He denies NSAID use.  He denies ETOH use.   He denies tobacco use.  He denies drug use.    He  reports that he has never smoked. He has never used smokeless tobacco. He reports that he does not drink alcohol and does not use drugs.  RELEVANT LABS AND IMAGING: CBC    Component Value Date/Time   WBC 5.6 12/09/2022 0938   RBC 4.90 12/09/2022 0938   HGB 15.1 12/09/2022 0938   HCT 45.0 12/09/2022 0938   PLT 252.0 12/09/2022 0938   MCV 91.8 12/09/2022 0938   MCH 30.4 09/15/2012 0545   MCHC 33.7 12/09/2022 0938  RDW 13.0 12/09/2022 0938   LYMPHSABS 1.0 12/09/2022 0938   MONOABS 0.4 12/09/2022 0938   EOSABS 0.2 12/09/2022 0938   BASOSABS 0.0 12/09/2022 0938   Recent Labs    12/09/22 0938  HGB 15.1     CMP     Component Value Date/Time   NA 137 12/09/2022 0938   K 4.6 12/09/2022 0938   CL 98 12/09/2022 0938   CO2 27 12/09/2022 0938   GLUCOSE 101 (H) 12/09/2022 0938   BUN 20 12/09/2022 0938   CREATININE 0.92 12/09/2022 0938   CALCIUM 9.6 12/09/2022 0938   PROT 7.1 12/09/2022 0938   ALBUMIN 4.6 12/09/2022 0938   AST 19 12/09/2022 0938   ALT 17 12/09/2022 0938   ALKPHOS 57 12/09/2022 0938   BILITOT 0.7 12/09/2022 0938   GFRNONAA >90 09/21/2012 0730   GFRAA >90 09/21/2012 0730      Latest Ref Rng & Units 12/09/2022    9:38 AM 06/10/2022    9:19 AM 12/10/2021   11:15 AM  Hepatic Function  Total Protein 6.0 - 8.3 g/dL 7.1  7.1  7.1   Albumin 3.5 - 5.2 g/dL 4.6  4.6  4.4   AST 0 - 37 U/L 19  20  17    ALT 0 - 53 U/L 17  16  18    Alk Phosphatase 39 - 117 U/L 57  58  58   Total Bilirubin 0.2 - 1.2 mg/dL 0.7  0.6  0.6       Current Medications:    Current Outpatient Medications (Cardiovascular):    lisinopril-hydrochlorothiazide (ZESTORETIC) 20-12.5 MG tablet, Take 1 tablet by mouth daily.     Current Outpatient Medications (Other):    finasteride (PROSCAR) 5 MG tablet, Take 5 mg by mouth daily.   Multiple Vitamin (MULTI VITAMIN) TABS, 1 tablet   pantoprazole (PROTONIX) 40 MG tablet, Take 1 tablet (40 mg total) by mouth daily.   tamsulosin (FLOMAX) 0.4 MG CAPS capsule, Take 1 capsule (0.4 mg total) by mouth daily.  Medical History:  Past Medical History:  Diagnosis Date   GERD (gastroesophageal reflux disease)    Hypertension    PARESTHESIA 03/16/2010   Qualifier: Diagnosis of  By: Amador Cunas  MD, Janett Labella    Right acetabular fracture [facility]) 09/15/2012   Unspecified vitamin D deficiency 09/14/2012   Allergies: No Known Allergies   Surgical History:  He  has a past surgical history that includes Tonsillectomy; External fixation leg (Right, 09/05/2012); and ORIF acetabular fracture (Right, 09/08/2012). Family History:  His family history includes Breast cancer in  his mother.  REVIEW OF SYSTEMS  : All other systems reviewed and negative except where noted in the History of Present Illness.  PHYSICAL EXAM: BP 110/60   Pulse 85   Ht 5\' 6"  (1.676 m)   Wt 152 lb (68.9 kg)   BMI 24.53 kg/m  General Appearance: Well nourished, in no apparent distress. Head:   Normocephalic and atraumatic. Eyes:  sclerae anicteric,conjunctive pink  Respiratory: Respiratory effort normal, BS equal bilaterally without rales, rhonchi, wheezing. Cardio: RRR with no MRGs. Peripheral pulses intact.  Abdomen: Soft,  Obese ,active bowel sounds. No tenderness . Without guarding and Without rebound. No masses. Rectal: Not evaluated Musculoskeletal: Full ROM, Normal gait. Without edema. Skin:  Dry and intact without significant lesions or rashes Neuro: Alert and  oriented x4;  No focal deficits. Psych:  Cooperative. Normal mood and affect.    Doree Albee, PA-C 11:26 AM

## 2023-04-25 ENCOUNTER — Encounter (HOSPITAL_BASED_OUTPATIENT_CLINIC_OR_DEPARTMENT_OTHER): Payer: Self-pay

## 2023-04-25 ENCOUNTER — Other Ambulatory Visit: Payer: Self-pay

## 2023-04-25 ENCOUNTER — Emergency Department (HOSPITAL_BASED_OUTPATIENT_CLINIC_OR_DEPARTMENT_OTHER): Payer: Medicare Other | Admitting: Radiology

## 2023-04-25 ENCOUNTER — Inpatient Hospital Stay (HOSPITAL_BASED_OUTPATIENT_CLINIC_OR_DEPARTMENT_OTHER)
Admission: EM | Admit: 2023-04-25 | Discharge: 2023-04-28 | DRG: 481 | Disposition: A | Discharge status: Home Health | Payer: Medicare Other | Attending: Internal Medicine | Admitting: Internal Medicine

## 2023-04-25 ENCOUNTER — Emergency Department (HOSPITAL_BASED_OUTPATIENT_CLINIC_OR_DEPARTMENT_OTHER): Payer: Medicare Other

## 2023-04-25 DIAGNOSIS — S72011A Unspecified intracapsular fracture of right femur, initial encounter for closed fracture: Principal | ICD-10-CM | POA: Diagnosis present

## 2023-04-25 DIAGNOSIS — S79911A Unspecified injury of right hip, initial encounter: Secondary | ICD-10-CM | POA: Diagnosis not present

## 2023-04-25 DIAGNOSIS — Z79899 Other long term (current) drug therapy: Secondary | ICD-10-CM

## 2023-04-25 DIAGNOSIS — K219 Gastro-esophageal reflux disease without esophagitis: Secondary | ICD-10-CM | POA: Diagnosis present

## 2023-04-25 DIAGNOSIS — W19XXXA Unspecified fall, initial encounter: Secondary | ICD-10-CM | POA: Diagnosis not present

## 2023-04-25 DIAGNOSIS — S63501A Unspecified sprain of right wrist, initial encounter: Secondary | ICD-10-CM | POA: Diagnosis not present

## 2023-04-25 DIAGNOSIS — M8580 Other specified disorders of bone density and structure, unspecified site: Secondary | ICD-10-CM | POA: Diagnosis not present

## 2023-04-25 DIAGNOSIS — Z8781 Personal history of (healed) traumatic fracture: Secondary | ICD-10-CM | POA: Diagnosis not present

## 2023-04-25 DIAGNOSIS — M810 Age-related osteoporosis without current pathological fracture: Secondary | ICD-10-CM | POA: Diagnosis not present

## 2023-04-25 DIAGNOSIS — W1811XA Fall from or off toilet without subsequent striking against object, initial encounter: Secondary | ICD-10-CM | POA: Diagnosis present

## 2023-04-25 DIAGNOSIS — M879 Osteonecrosis, unspecified: Secondary | ICD-10-CM | POA: Diagnosis not present

## 2023-04-25 DIAGNOSIS — M25559 Pain in unspecified hip: Secondary | ICD-10-CM | POA: Diagnosis present

## 2023-04-25 DIAGNOSIS — R9431 Abnormal electrocardiogram [ECG] [EKG]: Secondary | ICD-10-CM | POA: Diagnosis not present

## 2023-04-25 DIAGNOSIS — N400 Enlarged prostate without lower urinary tract symptoms: Secondary | ICD-10-CM | POA: Diagnosis present

## 2023-04-25 DIAGNOSIS — I10 Essential (primary) hypertension: Secondary | ICD-10-CM | POA: Diagnosis present

## 2023-04-25 DIAGNOSIS — Y92009 Unspecified place in unspecified non-institutional (private) residence as the place of occurrence of the external cause: Secondary | ICD-10-CM

## 2023-04-25 DIAGNOSIS — M25531 Pain in right wrist: Secondary | ICD-10-CM | POA: Diagnosis not present

## 2023-04-25 DIAGNOSIS — S72001A Fracture of unspecified part of neck of right femur, initial encounter for closed fracture: Secondary | ICD-10-CM | POA: Diagnosis not present

## 2023-04-25 DIAGNOSIS — M87051 Idiopathic aseptic necrosis of right femur: Secondary | ICD-10-CM | POA: Diagnosis not present

## 2023-04-25 DIAGNOSIS — M25551 Pain in right hip: Secondary | ICD-10-CM | POA: Diagnosis not present

## 2023-04-25 DIAGNOSIS — S60211A Contusion of right wrist, initial encounter: Secondary | ICD-10-CM | POA: Diagnosis present

## 2023-04-25 DIAGNOSIS — Z9889 Other specified postprocedural states: Secondary | ICD-10-CM | POA: Diagnosis not present

## 2023-04-25 DIAGNOSIS — Z803 Family history of malignant neoplasm of breast: Secondary | ICD-10-CM

## 2023-04-25 DIAGNOSIS — Y92002 Bathroom of unspecified non-institutional (private) residence single-family (private) house as the place of occurrence of the external cause: Secondary | ICD-10-CM

## 2023-04-25 HISTORY — DX: Malignant neoplasm of prostate: C61

## 2023-04-25 LAB — CBC WITH DIFFERENTIAL/PLATELET
Abs Immature Granulocytes: 0.04 10*3/uL (ref 0.00–0.07)
Basophils Absolute: 0 10*3/uL (ref 0.0–0.1)
Basophils Relative: 0 %
Eosinophils Absolute: 0.1 10*3/uL (ref 0.0–0.5)
Eosinophils Relative: 1 %
HCT: 42.6 % (ref 39.0–52.0)
Hemoglobin: 14.7 g/dL (ref 13.0–17.0)
Immature Granulocytes: 1 %
Lymphocytes Relative: 12 %
Lymphs Abs: 0.9 10*3/uL (ref 0.7–4.0)
MCH: 30.6 pg (ref 26.0–34.0)
MCHC: 34.5 g/dL (ref 30.0–36.0)
MCV: 88.6 fL (ref 80.0–100.0)
Monocytes Absolute: 0.6 10*3/uL (ref 0.1–1.0)
Monocytes Relative: 7 %
Neutro Abs: 6 10*3/uL (ref 1.7–7.7)
Neutrophils Relative %: 79 %
Platelets: 236 10*3/uL (ref 150–400)
RBC: 4.81 MIL/uL (ref 4.22–5.81)
RDW: 12.1 % (ref 11.5–15.5)
WBC: 7.6 10*3/uL (ref 4.0–10.5)
nRBC: 0 % (ref 0.0–0.2)

## 2023-04-25 LAB — BASIC METABOLIC PANEL
Anion gap: 9 (ref 5–15)
BUN: 25 mg/dL — ABNORMAL HIGH (ref 8–23)
CO2: 27 mmol/L (ref 22–32)
Calcium: 9.3 mg/dL (ref 8.9–10.3)
Chloride: 100 mmol/L (ref 98–111)
Creatinine, Ser: 0.96 mg/dL (ref 0.61–1.24)
GFR, Estimated: >60 mL/min (ref >60)
Glucose, Bld: 136 mg/dL — ABNORMAL HIGH (ref 70–99)
Potassium: 3.7 mmol/L (ref 3.5–5.1)
Sodium: 136 mmol/L (ref 135–145)

## 2023-04-25 MED ORDER — IBUPROFEN 400 MG PO TABS
400.0000 mg | ORAL_TABLET | Freq: Once | ORAL | Status: AC
  Administered 2023-04-25: 400 mg via ORAL
  Filled 2023-04-25: qty 1

## 2023-04-25 MED ORDER — ACETAMINOPHEN 325 MG PO TABS
650.0000 mg | ORAL_TABLET | Freq: Once | ORAL | Status: AC
  Administered 2023-04-25: 650 mg via ORAL
  Filled 2023-04-25: qty 2

## 2023-04-25 NOTE — ED Provider Notes (Signed)
[place] EMERGENCY DEPARTMENT AT [facility] Provider Note   CSN: [id number] Arrival date & time: 04/25/23  1622     History  Chief Complaint  Patient presents with   Marletta Lor    Leslie Cox is a 77 y.o. male.  Patient is a 77 year old male with a past medical history of hypertension and GERD presenting to the emergency department after a fall.  The patient states that he was trying to hang a shower rod and was standing on top of the toilet and when he tried to step down off the toilet he lost his balance and fell onto his right side.  He denies hitting his head or losing consciousness.  States that he is having pain in his right hip and right wrist.  He states that his wife helped him up but he has not really walked since the fall.  He denies any numbness or weakness.  He denies any presyncopal symptoms prior to the fall.  The history is provided by the patient and a relative.  Fall       Home Medications Prior to Admission medications   Medication Sig Start Date End Date Taking? Authorizing Provider  finasteride (PROSCAR) 5 MG tablet Take 5 mg by mouth daily.    [provider]  lisinopril-hydrochlorothiazide (ZESTORETIC) 20-12.5 MG tablet Take 1 tablet by mouth daily. 10/24/22   Jeani Sow, MD  Multiple Vitamin (MULTI VITAMIN) TABS 1 tablet    [provider]  pantoprazole (PROTONIX) 40 MG tablet Take 1 tablet (40 mg total) by mouth daily. 10/24/22   Jeani Sow, MD  tamsulosin (FLOMAX) 0.4 MG CAPS capsule Take 1 capsule (0.4 mg total) by mouth daily. 10/24/22   Jeani Sow, MD      Allergies    Patient has no known allergies.    Review of Systems   Review of Systems  Physical Exam Updated Vital Signs BP 137/86   Pulse (!) 111   Temp 98 F (36.7 C) (Oral)   Resp 18   Ht 5\' 6"  (1.676 m)   Wt 68.9 kg   SpO2 94%   BMI 24.53 kg/m  Physical Exam Vitals and nursing note reviewed.  Constitutional:      General: He is not in  acute distress.    Appearance: Normal appearance.  HENT:     Head: Normocephalic and atraumatic.     Nose: Nose normal.     Mouth/Throat:     Mouth: Mucous membranes are moist.     Pharynx: Oropharynx is clear.  Eyes:     Extraocular Movements: Extraocular movements intact.     Conjunctiva/sclera: Conjunctivae normal.     Pupils: Pupils are equal, round, and reactive to light.  Neck:     Comments: No midline neck tenderness Cardiovascular:     Rate and Rhythm: Normal rate and regular rhythm.     Pulses: Normal pulses.     Heart sounds: Normal heart sounds.  Pulmonary:     Effort: Pulmonary effort is normal.     Breath sounds: Normal breath sounds.  Abdominal:     General: Abdomen is flat.     Palpations: Abdomen is soft.     Tenderness: There is no abdominal tenderness.  Musculoskeletal:     Cervical back: Normal range of motion.     Comments: No midline back tenderness No bony tenderness to left upper or left lower extremity Pelvis stable, nontender Tenderness to palpation of dorsal aspect of right wrist  so some mild swelling, no snuffbox tenderness, no tenderness to right elbow or shoulder No bony tenderness to right lower extremity does have increased pain with internal/external rotation of right hip  Skin:    General: Skin is warm and dry.  Neurological:     General: No focal deficit present.     Mental Status: He is alert and oriented to person, place, and time.     Sensory: No sensory deficit.     Motor: No weakness.  Psychiatric:        Mood and Affect: Mood normal.        Behavior: Behavior normal.     ED Results / Procedures / Treatments   Labs (all labs ordered are listed, but only abnormal results are displayed) Labs Reviewed  BASIC METABOLIC PANEL - Abnormal; Notable for the following components:      Result Value   Glucose, Bld 136 (*)    BUN 25 (*)    All other components within normal limits  CBC WITH DIFFERENTIAL/PLATELET     EKG None  Radiology CT Hip Right Wo Contrast  Result Date: 04/25/2023 CLINICAL DATA:  Hip trauma, fracture suspected, x-ray performed EXAM: CT OF THE RIGHT HIP WITHOUT CONTRAST TECHNIQUE: Multidetector CT imaging of the right hip was performed according to the standard protocol. Multiplanar CT image reconstructions were also generated. RADIATION DOSE REDUCTION: This exam was performed according to the departmental dose-optimization program which includes automated exposure control, adjustment of the mA and/or kV according to patient size and/or use of iterative reconstruction technique. COMPARISON:  Radiographs 04/25/2023 FINDINGS: Bones/Joint/Cartilage Fixation hardware along the right hemipelvis. No acute fracture or dislocation. No hip joint effusion. 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 Ligaments Suboptimally assessed by CT. Muscles and Tendons No acute abnormality. Soft tissues Unremarkable. IMPRESSION: No acute fracture or dislocation. 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 Electronically Signed   By: Minerva Fester M.D.   On: 04/25/2023 21:08   DG Wrist Complete Right  Result Date: 04/25/2023 CLINICAL DATA:  Fall, wrist pain EXAM: RIGHT WRIST - COMPLETE 3+ VIEW COMPARISON:  None Available. FINDINGS: Small geode posteriorly in the lunate. No appreciable fracture. No acute bony findings. IMPRESSION: 1. No acute findings. 2. Small geode posteriorly in the lunate. Electronically Signed   By: Gaylyn Rong M.D.   On: 04/25/2023 18:23   DG Hip Unilat  With Pelvis 2-3 Views Right  Result Date: 04/25/2023 CLINICAL DATA:  Trauma EXAM: DG HIP (WITH OR WITHOUT PELVIS) 2-3V RIGHT COMPARISON:  CT abdomen and pelvis 07/10/2021 FINDINGS: Fixation hardware is again seen in the right hemipelvis. There is no acute fracture or dislocation identified. Joint spaces are maintained. The bones are osteopenic. IMPRESSION: 1. No acute fracture  or dislocation identified. 2. Osteopenia. Electronically Signed   By: Darliss Cheney M.D.   On: 04/25/2023 18:22    Procedures Procedures    Medications Ordered in ED Medications  acetaminophen (TYLENOL) tablet 650 mg (650 mg Oral Given 04/25/23 1818)    ED Course/ Medical Decision Making/ A&P Clinical Course as of 04/25/23 2317  Fri Apr 25, 2023  1827 No acute fracture or traumatic injury on X-rays. Will have ambulatory trial. [VK]  1915 Patient unable to ambulate 2/2 to hip pain. Will have CT of R hip. [VK]  2125 No fracture or dislocation on CT hip, Does have avascular necrosis. Will consult orthopedics.  [VK]  2245 I spoke with Dr. Shon Baton with orthopedics who recommended MRI  to evaluate for occult fracture. Since patient is unable to ambulate he is agreeable for admission for MRI and PT eval. [VK]    Clinical Course User Index [VK] Rexford Maus, DO                                 Medical Decision Making This patient presents to the ED with chief complaint(s) of fall with pertinent past medical history of hypertension, GERD which further complicates the presenting complaint. The complaint involves an extensive differential diagnosis and also carries with it a high risk of complications and morbidity.    The differential diagnosis includes hip fracture/dislocation, wrist fracture/sprain, dislocation, no head trauma making ICH or mass effect unlikely, no other traumatic injury seen on exam, no presyncopal symptoms taking syncopal fall unlikely  Additional history obtained: Additional history obtained from family Records reviewed N/A  ED Course and Reassessment: On patient's arrival he is hemodynamically stable in no acute distress.  Was initially evaluated by triage and had right wrist and hip x-ray performed.  Reads are pending at this time.  Does have some mild swelling to the right wrist and pain with internal/external rotation of the right hip.  He was given Tylenol for  pain control and will be closely reassessed.  Independent labs interpretation:  The following labs were independently interpreted: within normal range  Independent visualization of imaging: - I independently visualized the following imaging with scope of interpretation limited to determining acute life threatening conditions related to emergency care: R hip XR, CT R hip, which revealed avascular necrosis of R hip, no obvious fracture   Consultation: - Consulted or discussed management/test interpretation w/ external professional: orthopedics, hospitalist  Consideration for admission or further workup: patient requires admission for MRI and PT eval Social Determinants of health: N/A    Amount and/or Complexity of Data Reviewed Labs: ordered. Radiology: ordered.  Risk OTC drugs. Decision regarding hospitalization.          Final Clinical Impression(s) / ED Diagnoses Final diagnoses:  Fall, initial encounter  Right hip pain  Sprain of right wrist, initial encounter    Rx / DC Orders ED Discharge Orders     None         Rexford Maus, DO 04/25/23 2317

## 2023-04-25 NOTE — ED Triage Notes (Addendum)
Pt states mechanical fall while trying to put up a shower curtain rod this am. Pt reports R sided hip and thigh pain and R wrist soreness. No thinners.

## 2023-04-26 ENCOUNTER — Observation Stay (HOSPITAL_COMMUNITY): Payer: Medicare Other

## 2023-04-26 DIAGNOSIS — S72011A Unspecified intracapsular fracture of right femur, initial encounter for closed fracture: Secondary | ICD-10-CM | POA: Diagnosis not present

## 2023-04-26 DIAGNOSIS — S60211A Contusion of right wrist, initial encounter: Secondary | ICD-10-CM | POA: Diagnosis not present

## 2023-04-26 DIAGNOSIS — W1811XA Fall from or off toilet without subsequent striking against object, initial encounter: Secondary | ICD-10-CM | POA: Diagnosis present

## 2023-04-26 DIAGNOSIS — Z8781 Personal history of (healed) traumatic fracture: Secondary | ICD-10-CM

## 2023-04-26 DIAGNOSIS — M25551 Pain in right hip: Secondary | ICD-10-CM

## 2023-04-26 DIAGNOSIS — W19XXXA Unspecified fall, initial encounter: Secondary | ICD-10-CM | POA: Diagnosis not present

## 2023-04-26 DIAGNOSIS — Z9889 Other specified postprocedural states: Secondary | ICD-10-CM | POA: Diagnosis not present

## 2023-04-26 DIAGNOSIS — S72091A Other fracture of head and neck of right femur, initial encounter for closed fracture: Secondary | ICD-10-CM | POA: Diagnosis not present

## 2023-04-26 DIAGNOSIS — Z79899 Other long term (current) drug therapy: Secondary | ICD-10-CM | POA: Diagnosis not present

## 2023-04-26 DIAGNOSIS — Z803 Family history of malignant neoplasm of breast: Secondary | ICD-10-CM | POA: Diagnosis not present

## 2023-04-26 DIAGNOSIS — N400 Enlarged prostate without lower urinary tract symptoms: Secondary | ICD-10-CM | POA: Diagnosis present

## 2023-04-26 DIAGNOSIS — S72001A Fracture of unspecified part of neck of right femur, initial encounter for closed fracture: Secondary | ICD-10-CM | POA: Diagnosis not present

## 2023-04-26 DIAGNOSIS — Y92002 Bathroom of unspecified non-institutional (private) residence single-family (private) house as the place of occurrence of the external cause: Secondary | ICD-10-CM | POA: Diagnosis not present

## 2023-04-26 DIAGNOSIS — I10 Essential (primary) hypertension: Secondary | ICD-10-CM | POA: Diagnosis not present

## 2023-04-26 DIAGNOSIS — Y92009 Unspecified place in unspecified non-institutional (private) residence as the place of occurrence of the external cause: Secondary | ICD-10-CM

## 2023-04-26 DIAGNOSIS — M879 Osteonecrosis, unspecified: Secondary | ICD-10-CM | POA: Diagnosis not present

## 2023-04-26 DIAGNOSIS — M160 Bilateral primary osteoarthritis of hip: Secondary | ICD-10-CM | POA: Diagnosis not present

## 2023-04-26 DIAGNOSIS — K219 Gastro-esophageal reflux disease without esophagitis: Secondary | ICD-10-CM | POA: Diagnosis not present

## 2023-04-26 DIAGNOSIS — M810 Age-related osteoporosis without current pathological fracture: Secondary | ICD-10-CM | POA: Diagnosis not present

## 2023-04-26 DIAGNOSIS — S72041A Displaced fracture of base of neck of right femur, initial encounter for closed fracture: Secondary | ICD-10-CM | POA: Diagnosis not present

## 2023-04-26 MED ORDER — HYDROCODONE-ACETAMINOPHEN 5-325 MG PO TABS
1.0000 | ORAL_TABLET | Freq: Four times a day (QID) | ORAL | Status: DC | PRN
  Administered 2023-04-26 (×2): 1 via ORAL
  Filled 2023-04-26 (×3): qty 1

## 2023-04-26 MED ORDER — LISINOPRIL-HYDROCHLOROTHIAZIDE 20-12.5 MG PO TABS
1.0000 | ORAL_TABLET | Freq: Every day | ORAL | Status: DC
Start: 2023-04-26 — End: 2023-04-26

## 2023-04-26 MED ORDER — PANTOPRAZOLE SODIUM 40 MG PO TBEC
40.0000 mg | DELAYED_RELEASE_TABLET | Freq: Every day | ORAL | Status: DC
  Administered 2023-04-26 – 2023-04-28 (×3): 40 mg via ORAL
  Filled 2023-04-26 (×3): qty 1

## 2023-04-26 MED ORDER — POVIDONE-IODINE 10 % EX SWAB
2.0000 | Freq: Once | CUTANEOUS | Status: AC
  Administered 2023-04-26: 2 via TOPICAL

## 2023-04-26 MED ORDER — ONDANSETRON HCL 4 MG PO TABS
4.0000 mg | ORAL_TABLET | Freq: Four times a day (QID) | ORAL | Status: DC | PRN
Start: 2023-04-26 — End: 2023-04-27

## 2023-04-26 MED ORDER — TAMSULOSIN HCL 0.4 MG PO CAPS
0.4000 mg | ORAL_CAPSULE | Freq: Every day | ORAL | Status: DC
  Administered 2023-04-26 – 2023-04-28 (×3): 0.4 mg via ORAL
  Filled 2023-04-26 (×4): qty 1

## 2023-04-26 MED ORDER — ACETAMINOPHEN 650 MG RE SUPP: 650.0000 mg | Freq: Four times a day (QID) | RECTAL | Status: DC | PRN

## 2023-04-26 MED ORDER — ENOXAPARIN SODIUM 40 MG/0.4ML IJ SOSY
40.0000 mg | PREFILLED_SYRINGE | INTRAMUSCULAR | Status: DC
  Administered 2023-04-26: 40 mg via SUBCUTANEOUS
  Filled 2023-04-26 (×2): qty 0.4

## 2023-04-26 MED ORDER — TRAMADOL HCL 50 MG PO TABS: 50.0000 mg | ORAL_TABLET | Freq: Four times a day (QID) | ORAL | Status: DC | PRN

## 2023-04-26 MED ORDER — LISINOPRIL 20 MG PO TABS
20.0000 mg | ORAL_TABLET | Freq: Every day | ORAL | Status: DC
  Administered 2023-04-26 – 2023-04-28 (×3): 20 mg via ORAL
  Filled 2023-04-26 (×3): qty 1

## 2023-04-26 MED ORDER — HYDRALAZINE HCL 20 MG/ML IJ SOLN: 10.0000 mg | INTRAMUSCULAR | Status: DC | PRN

## 2023-04-26 MED ORDER — ONDANSETRON HCL 4 MG/2ML IJ SOLN: 4.0000 mg | Freq: Four times a day (QID) | INTRAMUSCULAR | Status: DC | PRN

## 2023-04-26 MED ORDER — ACETAMINOPHEN 325 MG PO TABS: 650.0000 mg | ORAL_TABLET | Freq: Four times a day (QID) | ORAL | Status: DC | PRN

## 2023-04-26 MED ORDER — HYDROCHLOROTHIAZIDE 12.5 MG PO TABS
12.5000 mg | ORAL_TABLET | Freq: Every day | ORAL | Status: DC
  Administered 2023-04-26 – 2023-04-28 (×3): 12.5 mg via ORAL
  Filled 2023-04-26 (×3): qty 1

## 2023-04-26 MED ORDER — ACETAMINOPHEN 500 MG PO TABS
1000.0000 mg | ORAL_TABLET | Freq: Once | ORAL | Status: DC
  Filled 2023-04-26: qty 2

## 2023-04-26 MED ORDER — TRANEXAMIC ACID-NACL 1000-0.7 MG/100ML-% IV SOLN
1000.0000 mg | INTRAVENOUS | Status: AC
  Administered 2023-04-27: 1000 mg via INTRAVENOUS

## 2023-04-26 MED ORDER — CEFAZOLIN SODIUM-DEXTROSE 2-4 GM/100ML-% IV SOLN
2.0000 g | INTRAVENOUS | Status: AC
  Administered 2023-04-27: 2 g via INTRAVENOUS

## 2023-04-26 MED ORDER — ALBUTEROL SULFATE (2.5 MG/3ML) 0.083% IN NEBU: 2.5000 mg | INHALATION_SOLUTION | Freq: Four times a day (QID) | RESPIRATORY_TRACT | Status: DC | PRN

## 2023-04-26 MED ORDER — FINASTERIDE 5 MG PO TABS
5.0000 mg | ORAL_TABLET | Freq: Every day | ORAL | Status: DC
  Administered 2023-04-26 – 2023-04-28 (×3): 5 mg via ORAL
  Filled 2023-04-26 (×3): qty 1

## 2023-04-26 MED ORDER — CHLORHEXIDINE GLUCONATE 4 % EX SOLN
60.0000 mL | Freq: Once | CUTANEOUS | Status: AC
  Administered 2023-04-27: 4 via TOPICAL
  Filled 2023-04-26: qty 15

## 2023-04-26 MED ORDER — SODIUM CHLORIDE 0.9% FLUSH
3.0000 mL | Freq: Two times a day (BID) | INTRAVENOUS | Status: DC
Start: 2023-04-26 — End: 2023-04-28
  Administered 2023-04-26 – 2023-04-27 (×3): 3 mL via INTRAVENOUS

## 2023-04-26 NOTE — Consult Note (Cosign Needed Addendum)
ORTHOPAEDIC CONSULTATION  REQUESTING PHYSICIAN: Clydie Braun, MD  Chief Complaint: right hip pain  HPI: Leslie Cox is a 77 y.o. male history of hypertension, BPH, GERD who presented to the [facility] emergency department with right hip pain after fall.  He fell directly onto his right side while trying to replace a shower curtain.  Pain at the right hip has been severe since the fall worse with movement and better with rest and pain medication. He has a little pain at right wrist since the fall. He has a history of a right acetabular fracture status post ORIF performed by Dr. Carola Frost in 2014.  X-rays in the emergency department showed no signs of fracture noted CT scan of the right hip. MRI of the right hip was taken today showing a minimally impacted right femoral neck fracture.   Patient ambulates without an assistive device at baseline. He works at [employer] 2 days a week.  He is not on a blood thinner.   Past Medical History:  Diagnosis Date   GERD (gastroesophageal reflux disease)    Hypertension    PARESTHESIA 03/16/2010   Qualifier: Diagnosis of  By: Amador Cunas  MD, Janett Labella    Right acetabular fracture [facility]) 09/15/2012   Unspecified vitamin D deficiency 09/14/2012   Past Surgical History:  Procedure Laterality Date   EXTERNAL FIXATION LEG Right 09/05/2012   Procedure: Application of traction bow externally;  Surgeon: Shelda Pal, MD;  Location: [facility] OR;  Service: Orthopedics;  Laterality: Right;   ORIF ACETABULAR FRACTURE Right 09/08/2012   Procedure: OPEN REDUCTION INTERNAL FIXATION (ORIF) ACETABULAR FRACTURE;  Surgeon: Budd Palmer, MD;  Location: [facility] OR;  Service: Orthopedics;  Laterality: Right;   TONSILLECTOMY     Social History   Socioeconomic History   Marital status: Married    Spouse name: Not on file   Number of children: 1   Years of education: Not on file   Highest education level: 12th grade  Occupational History   Occupation: Lobbyist: [employer]   Occupation: Lobbyist: [employer]  Tobacco Use   Smoking status: Never   Smokeless tobacco: Never  Vaping Use   Vaping status: Never Used  Substance and Sexual Activity   Alcohol use: No   Drug use: No   Sexual activity: Not on file  Other Topics Concern   Not on file  Social History Narrative   2 grands   Retired-food Geophysicist/field seismologist   Social Determinants of Health   Financial Resource Strain: Low Risk  (06/20/2022)   Overall Financial Resource Strain (CARDIA)    Difficulty of Paying Living Expenses: Not hard at all  Food Insecurity: No Food Insecurity (04/26/2023)   Hunger Vital Sign    Worried About Running Out of Food in the Last Year: Never true    Ran Out of Food in the Last Year: Never true  Transportation Needs: No Transportation Needs (04/26/2023)   PRAPARE - Administrator, Civil Service (Medical): No    Lack of Transportation (Non-Medical): No  Physical Activity: Insufficiently Active (12/08/2022)   Exercise Vital Sign    Days of Exercise per Week: 3 days    Minutes of Exercise per Session: 30 min  Stress: No Stress Concern Present (12/08/2022)   [employer] of Occupational Health - Occupational Stress Questionnaire    Feeling of Stress : Not at all  Social Connections: Unknown (12/08/2022)   Social Connection  and Isolation Panel [NHANES]    Frequency of Communication with Friends and Family: Patient declined    Frequency of Social Gatherings with Friends and Family: Patient declined    Attends Religious Services: Patient declined    Database administrator or Organizations: Patient declined    Attends Banker Meetings: Never    Marital Status: Married   Family History  Problem Relation Age of Onset   Breast cancer Mother    Cancer Neg Hx    Colon cancer Neg Hx    No Known Allergies   Positive ROS: All other systems have been reviewed and were otherwise negative with the exception of those mentioned  in the HPI and as above.  Physical Exam: General: Alert, no acute distress Cardiovascular: No pedal edema Respiratory: No cyanosis, no use of accessory musculature GI: No organomegaly, abdomen is soft and non-tender Skin: No lesions in the area of chief complaint Neurologic: Sensation intact distally Psychiatric: Patient is competent for consent with normal mood and affect Lymphatic: No axillary or cervical lymphadenopathy  MUSCULOSKELETAL: RUE: a little erythema over ulnar styloid. Mild TTP over distal radius but full ROM of right wrist and all fingers of right hand.   RLE: 2+ DP pulse. Dorsiflexion and plantarflexion intact. Distal sensation intact.   Assessment: Right femoral neck fracture Rioght wrist contusion   Imaging: X-rays of right hip shows no acute fracture, osteopenia. MRI of right hip shows minimally impacted right femoral neck fracture.   Plan: - discussed risks and benefits of non-operative treatment vs. Right hip pinning vs. Right total hip replacement in setting on previous right acetabulum ORIF. Patient has elected for right hip pinning.  -  Will plan for right hip pending with Dr. Dion Saucier tomorrow morning. -Please keep n.p.o. after midnight -Nonweightbearing on right leg in anticipation of surgery - will order right wrist brace which can be used for comfort    Armida Sans, PA-C    04/26/2023 2:13 PM

## 2023-04-26 NOTE — ED Notes (Signed)
Patient denies pain and is resting comfortably.  

## 2023-04-26 NOTE — ED Notes (Signed)
PT report called to [facility], verbalized complete understanding of pt plan of care and current condition, denies questions at this time. PT resting comfortably denies pain while still. VSS NAD IV intact Pt continues on room air. Pt son Leslie Cox notified of room and plan of care.

## 2023-04-26 NOTE — Progress Notes (Signed)
MRI of the right hip noted minimally impacted subcapital fracture.  This is the same that patient had previous ORIF for acetabular fracture back in 2014.  Dr. Dion Saucier of Orthopedics consulted currently covering for Dr. Magdalene Patricia group.  Recommended to keep patient n.p.o. after midnight for possible need of surgery in a.m.

## 2023-04-26 NOTE — ED Notes (Signed)
[facility] to bedside at this time.

## 2023-04-26 NOTE — Progress Notes (Signed)
Patient arrived via [facility] to [facility] room 13.

## 2023-04-26 NOTE — ED Notes (Signed)
-  Called [facility] for transportation to [facility] at 631am.

## 2023-04-26 NOTE — Progress Notes (Signed)
PT Cancellation Note  Patient Details Name: Oluwatimileyin Hemp MRN: [id number] DOB: 1945/11/18   Cancelled Treatment:    Reason Eval/Treat Not Completed: Medical issues which prohibited therapy. MRI revealed minimally impacted subcapital fracture of the right hip. Will await ortho consult.    Ilda Foil 04/26/2023, 1:17 PM

## 2023-04-26 NOTE — H&P (Signed)
History and Physical    Patient: Leslie Cox GMW:[id number] DOB: 07-21-1945 DOA: 04/25/2023 DOS: the patient was seen and examined on 04/26/2023 PCP: Jeani Sow, MD  Patient coming from: Transfer from [facility]  Chief Complaint:  Chief Complaint  Patient presents with   Fall   HPI: Leslie Cox is a 77 y.o. male with medical history significant of hypertension, BPH, and GERD who presents after having a fall with right hip pain.  Patient had moved the shower curtain and the tension rod fell down.  He was trying to put the rod back in place and had been standing on the toilet seat when he lost his balance falling backwards onto his right side.  Denied hitting his head or any loss of consciousness.  He reported having pain in his right hip as well as right wrist.  Patient has prior history of a right acetabular fracture back in 2014 that was repaired by Dr. Carola Frost.  At baseline patient is able to ambulate without need of assistance.  Denied having any recent fever, palpitations, chest pain, shortness of breath, abdominal pain, nausea, vomiting, or diarrhea.  In the emergency department patient was noted to be afebrile with pulse elevated up to 118, blood pressures 106/78 to 163/93, and all other vital signs maintained.  Labs noted BUN 25 and creatinine 0.96.  X-rays of the hip and right wrist showed no acute fracture.  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  Transfer requested for need of MRI of the right hip.  Review of Systems: As mentioned in the history of present illness. All other systems reviewed and are negative. Past Medical History:  Diagnosis Date   GERD (gastroesophageal reflux disease)    Hypertension    PARESTHESIA 03/16/2010   Qualifier: Diagnosis of  By: Amador Cunas  MD, Janett Labella    Right acetabular fracture [facility]) 09/15/2012   Unspecified  vitamin D deficiency 09/14/2012   Past Surgical History:  Procedure Laterality Date   EXTERNAL FIXATION LEG Right 09/05/2012   Procedure: Application of traction bow externally;  Surgeon: Shelda Pal, MD;  Location: [facility] OR;  Service: Orthopedics;  Laterality: Right;   ORIF ACETABULAR FRACTURE Right 09/08/2012   Procedure: OPEN REDUCTION INTERNAL FIXATION (ORIF) ACETABULAR FRACTURE;  Surgeon: Budd Palmer, MD;  Location: [facility] OR;  Service: Orthopedics;  Laterality: Right;   TONSILLECTOMY     Social History:  reports that he has never smoked. He has never used smokeless tobacco. He reports that he does not drink alcohol and does not use drugs.  No Known Allergies  Family History  Problem Relation Age of Onset   Breast cancer Mother    Cancer Neg Hx    Colon cancer Neg Hx     Prior to Admission medications   Medication Sig Start Date End Date Taking? Authorizing Provider  finasteride (PROSCAR) 5 MG tablet Take 5 mg by mouth daily.    [provider]  lisinopril-hydrochlorothiazide (ZESTORETIC) 20-12.5 MG tablet Take 1 tablet by mouth daily. 10/24/22   Jeani Sow, MD  Multiple Vitamin (MULTI VITAMIN) TABS 1 tablet    [provider]  pantoprazole (PROTONIX) 40 MG tablet Take 1 tablet (40 mg total) by mouth daily. 10/24/22   Jeani Sow, MD  tamsulosin (FLOMAX) 0.4 MG CAPS capsule Take 1 capsule (0.4 mg total) by mouth daily. 10/24/22   Ruthine Dose,  Maryruth Hancock, MD    Physical Exam: Vitals:   04/26/23 0530 04/26/23 0600 04/26/23 0630 04/26/23 0715  BP: 122/79 114/75 128/88 (!) 140/91  Pulse: 83 79 94 99  Resp: 15 15 18 14   Temp:    98.1 F (36.7 C)  TempSrc:    Oral  SpO2: 91% 93% 96% 100%  Weight:      Height:       Exam  Constitutional: Elderly male currently in no acute distress Eyes: PERRL, lids and conjunctivae normal ENMT: Mucous membranes are moist.  Fair dentition.  Hearing aids in place. Neck: normal, supple  Respiratory: clear to auscultation  bilaterally, no wheezing, no crackles. Normal respiratory effort. No accessory muscle use.  Cardiovascular: Regular rate and rhythm, no murmurs / rubs / gallops. No extremity edema. 2+ pedal pulses.   Abdomen: no tenderness, no masses palpated.  Bowel sounds positive.  Musculoskeletal: no clubbing / cyanosis.  No gross joint deformity noted of the right hip, but tenderness to palpation noted.  Extremities. Good ROM, no contractures. Normal muscle tone.  Skin: no rashes, lesions, ulcers. No induration Neurologic: CN 2-12 grossly intact.  Strength 5/5 in all 4.  Psychiatric: Normal judgment and insight. Alert and oriented x 3. Normal mood.   Data Reviewed:  EKG reveals sinus tachycardia at 115 bpm without significant ischemic changes.  Reviewed labs, imaging, and pertinent records as documented  Assessment and Plan:  Fall at home with right hip pain History of right acetabular fracture s/p ORIF Acute.  Patient notes that he was sitting on the toilet seat trying to put back up a tension curtain rod when he fell landing on his right hip.  Denied any loss of consciousness.  X-rays of the right hip did not note any acute fracture.  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  Patient with prior history of right acetabular fracture status post open reduction internal fixation by Dr. Carola Frost back in 08/2012.  Patient was transferred to [facility] for need of MRI for further evaluation. -Admit to a MedSurg bed -Up with assistance -Follow-up MRI of the right hip -Tramadol/hydrocodone as needed for moderate to severe pain -PT to evaluate and treat -Plan to formally consult orthopedics if MRI of the right hip noted to be abnormal.  Essential hypertension On admission blood pressures elevated up to 160/93.  He had not taken his normal home medications since yesterday. -Resume home blood pressure regimen -Hydralazine IV  as needed for elevated blood pressures  BPH -Continue current regimen  GERD -Continue Protonix  DVT prophylaxis: Lovenox  Advance Care Planning:   Code Status: Full Code   Consults: None  Family Communication: Son updated at bedside  Severity of Illness: The appropriate patient status for this patient is OBSERVATION. Observation status is judged to be reasonable and necessary in order to provide the required intensity of service to ensure the patient's safety. The patient's presenting symptoms, physical exam findings, and initial radiographic and laboratory data in the context of their medical condition is felt to place them at decreased risk for further clinical deterioration. Furthermore, it is anticipated that the patient will be medically stable for discharge from the hospital within 2 midnights of admission.   Author: Clydie Braun, MD 04/26/2023 8:35 AM  For on call review [email or web address].

## 2023-04-26 NOTE — Progress Notes (Signed)
Orthopedic Tech Progress Note Patient Details:  Leslie Cox 1945/09/24 [id number]  Ortho Devices Type of Ortho Device: Velcro wrist splint Ortho Device/Splint Location: RUE Ortho Device/Splint Interventions: Ordered, Application, Adjustment   Post Interventions Patient Tolerated: Well Instructions Provided: Care of device, Adjustment of device  Marshawn Ninneman Carmine Savoy 04/26/2023, 6:19 PM

## 2023-04-27 ENCOUNTER — Inpatient Hospital Stay (HOSPITAL_COMMUNITY): Payer: Medicare Other | Admitting: Anesthesiology

## 2023-04-27 ENCOUNTER — Other Ambulatory Visit: Payer: Self-pay

## 2023-04-27 ENCOUNTER — Inpatient Hospital Stay (HOSPITAL_COMMUNITY): Payer: Medicare Other

## 2023-04-27 ENCOUNTER — Encounter (HOSPITAL_COMMUNITY): Payer: Self-pay | Admitting: Internal Medicine

## 2023-04-27 ENCOUNTER — Encounter (HOSPITAL_COMMUNITY)
Admission: EM | Disposition: A | Discharge status: Home Health | Payer: Self-pay | Source: Home / Self Care | Attending: Internal Medicine

## 2023-04-27 DIAGNOSIS — S72001A Fracture of unspecified part of neck of right femur, initial encounter for closed fracture: Secondary | ICD-10-CM

## 2023-04-27 HISTORY — PX: PERCUTANEOUS PINNING: SHX2209

## 2023-04-27 LAB — CBC
HCT: 39.7 % (ref 39.0–52.0)
HCT: 39.3 % (ref 39.0–52.0)
Hemoglobin: 13.5 g/dL (ref 13.0–17.0)
Hemoglobin: 13.2 g/dL (ref 13.0–17.0)
MCH: 31.2 pg (ref 26.0–34.0)
MCH: 30.8 pg (ref 26.0–34.0)
MCHC: 34 g/dL (ref 30.0–36.0)
MCHC: 33.6 g/dL (ref 30.0–36.0)
MCV: 91.7 fL (ref 80.0–100.0)
MCV: 91.8 fL (ref 80.0–100.0)
Platelets: 182 10*3/uL (ref 150–400)
Platelets: 218 10*3/uL (ref 150–400)
RBC: 4.33 MIL/uL (ref 4.22–5.81)
RBC: 4.28 MIL/uL (ref 4.22–5.81)
RDW: 12.2 % (ref 11.5–15.5)
RDW: 12.1 % (ref 11.5–15.5)
WBC: 9 10*3/uL (ref 4.0–10.5)
WBC: 6.8 10*3/uL (ref 4.0–10.5)
nRBC: 0 % (ref 0.0–0.2)
nRBC: 0 % (ref 0.0–0.2)

## 2023-04-27 LAB — BASIC METABOLIC PANEL
Anion gap: 10 (ref 5–15)
BUN: 22 mg/dL (ref 8–23)
CO2: 23 mmol/L (ref 22–32)
Calcium: 8.1 mg/dL — ABNORMAL LOW (ref 8.9–10.3)
Chloride: 102 mmol/L (ref 98–111)
Creatinine, Ser: 1.24 mg/dL (ref 0.61–1.24)
GFR, Estimated: >60 mL/min (ref >60)
Glucose, Bld: 122 mg/dL — ABNORMAL HIGH (ref 70–99)
Potassium: 4.4 mmol/L (ref 3.5–5.1)
Sodium: 135 mmol/L (ref 135–145)

## 2023-04-27 LAB — SURGICAL PCR SCREEN
MRSA, PCR: NEGATIVE
Staphylococcus aureus: NEGATIVE

## 2023-04-27 LAB — VITAMIN D 25 HYDROXY (VIT D DEFICIENCY, FRACTURES): Vit D, 25-Hydroxy: 34.81 ng/mL (ref 30–100)

## 2023-04-27 LAB — CREATININE, SERUM
Creatinine, Ser: 1.19 mg/dL (ref 0.61–1.24)
GFR, Estimated: >60 mL/min (ref >60)

## 2023-04-27 SURGERY — PINNING, EXTREMITY, PERCUTANEOUS
Anesthesia: General | Laterality: Right

## 2023-04-27 MED ORDER — PHENYLEPHRINE 80 MCG/ML (10ML) SYRINGE FOR IV PUSH (FOR BLOOD PRESSURE SUPPORT)
PREFILLED_SYRINGE | INTRAVENOUS | Status: AC
  Filled 2023-04-27: qty 10

## 2023-04-27 MED ORDER — EPHEDRINE SULFATE-NACL 50-0.9 MG/10ML-% IV SOSY
PREFILLED_SYRINGE | INTRAVENOUS | Status: DC | PRN
  Administered 2023-04-27 (×5): 5 mg via INTRAVENOUS

## 2023-04-27 MED ORDER — ORAL CARE MOUTH RINSE: 15.0000 mL | Freq: Once | OROMUCOSAL | Status: AC

## 2023-04-27 MED ORDER — POLYETHYLENE GLYCOL 3350 17 G PO PACK
17.0000 g | PACK | Freq: Every day | ORAL | Status: DC | PRN
Start: 2023-04-27 — End: 2023-04-28

## 2023-04-27 MED ORDER — MAGNESIUM CITRATE PO SOLN: 1.0000 | Freq: Once | ORAL | Status: DC | PRN

## 2023-04-27 MED ORDER — OXYCODONE HCL 5 MG PO TABS: 5.0000 mg | ORAL_TABLET | Freq: Once | ORAL | Status: DC | PRN

## 2023-04-27 MED ORDER — TRANEXAMIC ACID-NACL 1000-0.7 MG/100ML-% IV SOLN
INTRAVENOUS | Status: AC
  Filled 2023-04-27: qty 100

## 2023-04-27 MED ORDER — HYDROCODONE-ACETAMINOPHEN 5-325 MG PO TABS: 1.0000 | ORAL_TABLET | ORAL | Status: DC | PRN

## 2023-04-27 MED ORDER — SODIUM CHLORIDE 0.9% FLUSH
3.0000 mL | Freq: Two times a day (BID) | INTRAVENOUS | Status: DC
Start: 2023-04-27 — End: 2023-04-28
  Administered 2023-04-27 (×2): 3 mL via INTRAVENOUS

## 2023-04-27 MED ORDER — MENTHOL 3 MG MT LOZG: 1.0000 | LOZENGE | OROMUCOSAL | Status: DC | PRN

## 2023-04-27 MED ORDER — ONDANSETRON HCL 4 MG/2ML IJ SOLN
INTRAMUSCULAR | Status: AC
  Filled 2023-04-27: qty 2

## 2023-04-27 MED ORDER — ENOXAPARIN SODIUM 40 MG/0.4ML IJ SOSY
40.0000 mg | PREFILLED_SYRINGE | INTRAMUSCULAR | Status: DC
  Administered 2023-04-28: 40 mg via SUBCUTANEOUS
  Filled 2023-04-27: qty 0.4

## 2023-04-27 MED ORDER — CEFAZOLIN SODIUM-DEXTROSE 2-4 GM/100ML-% IV SOLN
2.0000 g | Freq: Four times a day (QID) | INTRAVENOUS | Status: AC
  Administered 2023-04-27 (×2): 2 g via INTRAVENOUS
  Filled 2023-04-27 (×2): qty 100

## 2023-04-27 MED ORDER — ROCURONIUM BROMIDE 10 MG/ML (PF) SYRINGE
PREFILLED_SYRINGE | INTRAVENOUS | Status: DC | PRN
  Administered 2023-04-27: 40 mg via INTRAVENOUS

## 2023-04-27 MED ORDER — SENNA 8.6 MG PO TABS
1.0000 | ORAL_TABLET | Freq: Two times a day (BID) | ORAL | Status: DC
  Administered 2023-04-27 – 2023-04-28 (×3): 8.6 mg via ORAL
  Filled 2023-04-27 (×3): qty 1

## 2023-04-27 MED ORDER — ACETAMINOPHEN 160 MG/5ML PO SOLN: 1000.0000 mg | Freq: Once | ORAL | Status: DC | PRN

## 2023-04-27 MED ORDER — DEXAMETHASONE SODIUM PHOSPHATE 10 MG/ML IJ SOLN
INTRAMUSCULAR | Status: DC | PRN
  Administered 2023-04-27: 10 mg via INTRAVENOUS

## 2023-04-27 MED ORDER — ACETAMINOPHEN 10 MG/ML IV SOLN
1000.0000 mg | Freq: Once | INTRAVENOUS | Status: DC | PRN
  Administered 2023-04-27: 1000 mg via INTRAVENOUS

## 2023-04-27 MED ORDER — FENTANYL CITRATE (PF) 250 MCG/5ML IJ SOLN
INTRAMUSCULAR | Status: DC | PRN
  Administered 2023-04-27: 50 ug via INTRAVENOUS

## 2023-04-27 MED ORDER — HYDROCODONE-ACETAMINOPHEN 7.5-325 MG PO TABS
1.0000 | ORAL_TABLET | ORAL | Status: DC | PRN
  Administered 2023-04-28: 1 via ORAL
  Filled 2023-04-27: qty 1

## 2023-04-27 MED ORDER — ACETAMINOPHEN 325 MG PO TABS: 325.0000 mg | ORAL_TABLET | Freq: Four times a day (QID) | ORAL | Status: DC | PRN

## 2023-04-27 MED ORDER — MORPHINE SULFATE (PF) 2 MG/ML IV SOLN: 0.5000 mg | INTRAVENOUS | Status: DC | PRN

## 2023-04-27 MED ORDER — BUPIVACAINE HCL (PF) 0.5 % IJ SOLN
INTRAMUSCULAR | Status: AC
  Filled 2023-04-27: qty 30

## 2023-04-27 MED ORDER — ONDANSETRON HCL 4 MG PO TABS: 4.0000 mg | ORAL_TABLET | Freq: Four times a day (QID) | ORAL | Status: DC | PRN

## 2023-04-27 MED ORDER — PROPOFOL 10 MG/ML IV BOLUS
INTRAVENOUS | Status: DC | PRN
  Administered 2023-04-27: 90 mg via INTRAVENOUS

## 2023-04-27 MED ORDER — SODIUM CHLORIDE 0.9% FLUSH
10.0000 mL | Freq: Once | INTRAVENOUS | Status: AC
  Administered 2023-04-27: 10 mL via INTRAVENOUS

## 2023-04-27 MED ORDER — DEXAMETHASONE SODIUM PHOSPHATE 10 MG/ML IJ SOLN
INTRAMUSCULAR | Status: AC
  Filled 2023-04-27: qty 1

## 2023-04-27 MED ORDER — EPHEDRINE 5 MG/ML INJ
INTRAVENOUS | Status: AC
  Filled 2023-04-27: qty 5

## 2023-04-27 MED ORDER — OXYCODONE HCL 5 MG/5ML PO SOLN: 5.0000 mg | Freq: Once | ORAL | Status: DC | PRN

## 2023-04-27 MED ORDER — PHENOL 1.4 % MT LIQD: 1.0000 | OROMUCOSAL | Status: DC | PRN

## 2023-04-27 MED ORDER — ACETAMINOPHEN 500 MG PO TABS
500.0000 mg | ORAL_TABLET | Freq: Four times a day (QID) | ORAL | Status: AC
  Administered 2023-04-27 – 2023-04-28 (×3): 500 mg via ORAL
  Filled 2023-04-27 (×4): qty 1

## 2023-04-27 MED ORDER — 0.9 % SODIUM CHLORIDE (POUR BTL) OPTIME
TOPICAL | Status: DC | PRN
  Administered 2023-04-27: 1000 mL

## 2023-04-27 MED ORDER — ONDANSETRON HCL 4 MG/2ML IJ SOLN
INTRAMUSCULAR | Status: DC | PRN
  Administered 2023-04-27: 4 mg via INTRAVENOUS

## 2023-04-27 MED ORDER — FERROUS SULFATE 325 (65 FE) MG PO TABS
325.0000 mg | ORAL_TABLET | Freq: Three times a day (TID) | ORAL | Status: DC
  Administered 2023-04-27 – 2023-04-28 (×4): 325 mg via ORAL
  Filled 2023-04-27 (×4): qty 1

## 2023-04-27 MED ORDER — TRANEXAMIC ACID-NACL 1000-0.7 MG/100ML-% IV SOLN
1000.0000 mg | Freq: Once | INTRAVENOUS | Status: AC
  Administered 2023-04-27: 1000 mg via INTRAVENOUS
  Filled 2023-04-27: qty 100

## 2023-04-27 MED ORDER — CEFAZOLIN SODIUM-DEXTROSE 2-4 GM/100ML-% IV SOLN
INTRAVENOUS | Status: AC
  Filled 2023-04-27: qty 100

## 2023-04-27 MED ORDER — ONDANSETRON HCL 4 MG/2ML IJ SOLN: 4.0000 mg | Freq: Four times a day (QID) | INTRAMUSCULAR | Status: DC | PRN

## 2023-04-27 MED ORDER — FENTANYL CITRATE (PF) 250 MCG/5ML IJ SOLN
INTRAMUSCULAR | Status: AC
  Filled 2023-04-27: qty 5

## 2023-04-27 MED ORDER — BUPIVACAINE HCL 0.25 % IJ SOLN
INTRAMUSCULAR | Status: DC | PRN
  Administered 2023-04-27: 10 mL

## 2023-04-27 MED ORDER — PROPOFOL 10 MG/ML IV BOLUS
INTRAVENOUS | Status: AC
  Filled 2023-04-27: qty 20

## 2023-04-27 MED ORDER — LIDOCAINE 2% (20 MG/ML) 5 ML SYRINGE
INTRAMUSCULAR | Status: DC | PRN
  Administered 2023-04-27: 40 mg via INTRAVENOUS

## 2023-04-27 MED ORDER — SODIUM CHLORIDE 0.9 % IV SOLN: INTRAVENOUS | Status: DC | PRN

## 2023-04-27 MED ORDER — SODIUM CHLORIDE 0.9 % IV BOLUS
500.0000 mL | Freq: Once | INTRAVENOUS | Status: AC
  Administered 2023-04-27: 500 mL via INTRAVENOUS

## 2023-04-27 MED ORDER — LIDOCAINE 2% (20 MG/ML) 5 ML SYRINGE
INTRAMUSCULAR | Status: AC
  Filled 2023-04-27: qty 5

## 2023-04-27 MED ORDER — PHENYLEPHRINE 80 MCG/ML (10ML) SYRINGE FOR IV PUSH (FOR BLOOD PRESSURE SUPPORT)
PREFILLED_SYRINGE | INTRAVENOUS | Status: DC | PRN
  Administered 2023-04-27: 40 ug via INTRAVENOUS
  Administered 2023-04-27 (×2): 160 ug via INTRAVENOUS
  Administered 2023-04-27 (×2): 80 ug via INTRAVENOUS
  Administered 2023-04-27: 160 ug via INTRAVENOUS

## 2023-04-27 MED ORDER — ACETAMINOPHEN 500 MG PO TABS: 1000.0000 mg | ORAL_TABLET | Freq: Once | ORAL | Status: DC | PRN

## 2023-04-27 MED ORDER — ALUM & MAG HYDROXIDE-SIMETH 200-200-20 MG/5ML PO SUSP: 30.0000 mL | ORAL | Status: DC | PRN

## 2023-04-27 MED ORDER — BISACODYL 10 MG RE SUPP: 10.0000 mg | Freq: Every day | RECTAL | Status: DC | PRN

## 2023-04-27 MED ORDER — ACETAMINOPHEN 10 MG/ML IV SOLN
INTRAVENOUS | Status: AC
  Filled 2023-04-27: qty 100

## 2023-04-27 MED ORDER — SUGAMMADEX SODIUM 200 MG/2ML IV SOLN
INTRAVENOUS | Status: DC | PRN
  Administered 2023-04-27: 200 mg via INTRAVENOUS

## 2023-04-27 MED ORDER — ROCURONIUM BROMIDE 10 MG/ML (PF) SYRINGE
PREFILLED_SYRINGE | INTRAVENOUS | Status: AC
  Filled 2023-04-27: qty 10

## 2023-04-27 MED ORDER — CHLORHEXIDINE GLUCONATE 0.12 % MT SOLN
15.0000 mL | Freq: Once | OROMUCOSAL | Status: AC
Start: 2023-04-27 — End: 2023-04-27
  Administered 2023-04-27: 15 mL via OROMUCOSAL

## 2023-04-27 MED ORDER — DOCUSATE SODIUM 100 MG PO CAPS
100.0000 mg | ORAL_CAPSULE | Freq: Two times a day (BID) | ORAL | Status: DC
  Administered 2023-04-27 – 2023-04-28 (×3): 100 mg via ORAL
  Filled 2023-04-27 (×3): qty 1

## 2023-04-27 MED ORDER — CHLORHEXIDINE GLUCONATE 0.12 % MT SOLN
OROMUCOSAL | Status: AC
  Filled 2023-04-27: qty 15

## 2023-04-27 MED ORDER — FENTANYL CITRATE (PF) 100 MCG/2ML IJ SOLN
25.0000 ug | INTRAMUSCULAR | Status: DC | PRN
Start: 2023-04-27 — End: 2023-04-27

## 2023-04-27 SURGICAL SUPPLY — 50 items
ADH SKN CLS APL DERMABOND .7 (GAUZE/BANDAGES/DRESSINGS) ×1
APL SKNCLS STERI-STRIP NONHPOA (GAUZE/BANDAGES/DRESSINGS)
BAG COUNTER SPONGE SURGICOUNT (BAG) ×1 IMPLANT
BAG SPNG CNTER NS LX DISP (BAG) ×1
BENZOIN TINCTURE PRP APPL 2/3 (GAUZE/BANDAGES/DRESSINGS) IMPLANT
BIT DRILL CANNULATED (DRILL) IMPLANT
BNDG CMPR 5X6 CHSV STRCH STRL (GAUZE/BANDAGES/DRESSINGS)
BNDG COHESIVE 6X5 TAN ST LF (GAUZE/BANDAGES/DRESSINGS) IMPLANT
BOOTCOVER CLEANROOM LRG (PROTECTIVE WEAR) ×2 IMPLANT
CLSR STERI-STRIP ANTIMIC 1/2X4 (GAUZE/BANDAGES/DRESSINGS) IMPLANT
COVER PERINEAL POST (MISCELLANEOUS) ×1 IMPLANT
COVER SURGICAL LIGHT HANDLE (MISCELLANEOUS) ×1 IMPLANT
DERMABOND ADVANCED .7 DNX12 (GAUZE/BANDAGES/DRESSINGS) IMPLANT
DRAPE STERI IOBAN 125X83 (DRAPES) ×1 IMPLANT
DRESSING MEPILEX FLEX 4X4 (GAUZE/BANDAGES/DRESSINGS) ×1 IMPLANT
DRILL CANNULATED (DRILL) ×1
DRSG MEPILEX FLEX 4X4 (GAUZE/BANDAGES/DRESSINGS) ×1
DURAPREP 26ML APPLICATOR (WOUND CARE) ×1 IMPLANT
ELECT REM PT RETURN 9FT ADLT (ELECTROSURGICAL) ×1
ELECTRODE REM PT RTRN 9FT ADLT (ELECTROSURGICAL) ×1 IMPLANT
GAUZE PAD ABD 8X10 STRL (GAUZE/BANDAGES/DRESSINGS) IMPLANT
GLOVE BIO SURGEON STRL SZ7 (GLOVE) ×2 IMPLANT
GLOVE BIOGEL PI IND STRL 7.0 (GLOVE) ×1 IMPLANT
GLOVE ORTHO TXT STRL SZ7.5 (GLOVE) ×1 IMPLANT
GOWN STRL REUS W/ TWL LRG LVL3 (GOWN DISPOSABLE) IMPLANT
GOWN STRL REUS W/ TWL XL LVL3 (GOWN DISPOSABLE) ×1 IMPLANT
GOWN STRL REUS W/TWL LRG LVL3 (GOWN DISPOSABLE)
GOWN STRL REUS W/TWL XL LVL3 (GOWN DISPOSABLE) ×1
GUIDE PIN 3.2MM (PIN) ×3
GUIDE PIN ORTH 12X3.2X (PIN) IMPLANT
KIT BASIN OR (CUSTOM PROCEDURE TRAY) ×1 IMPLANT
KIT TURNOVER KIT B (KITS) ×1 IMPLANT
MANIFOLD NEPTUNE II (INSTRUMENTS) ×1 IMPLANT
NDL 22X1.5 STRL (OR ONLY) (MISCELLANEOUS) ×1 IMPLANT
NEEDLE 22X1.5 STRL (OR ONLY) (MISCELLANEOUS) ×1 IMPLANT
NS IRRIG 1000ML POUR BTL (IV SOLUTION) ×1 IMPLANT
PACK GENERAL/GYN (CUSTOM PROCEDURE TRAY) ×1 IMPLANT
PAD ARMBOARD 7.5X6 YLW CONV (MISCELLANEOUS) ×2 IMPLANT
SCREW CANN RVR CUT FLUT 90X16X (Screw) IMPLANT
SCREW CANNULATED 6.5X90 (Screw) ×2 IMPLANT
SCREW CANNULATED 6.5X95 (Screw) IMPLANT
SUT VIC AB 2-0 FS1 27 (SUTURE) ×1 IMPLANT
SUT VIC AB 2-0 SH 27 (SUTURE)
SUT VIC AB 2-0 SH 27XBRD (SUTURE) IMPLANT
SUT VIC AB 3-0 SH 27 (SUTURE)
SUT VIC AB 3-0 SH 27X BRD (SUTURE) IMPLANT
SYR CONTROL 10ML LL (SYRINGE) ×1 IMPLANT
TOWEL GREEN STERILE (TOWEL DISPOSABLE) ×1 IMPLANT
TOWEL GREEN STERILE FF (TOWEL DISPOSABLE) ×1 IMPLANT
WATER STERILE IRR 1000ML POUR (IV SOLUTION) ×1 IMPLANT

## 2023-04-27 NOTE — Progress Notes (Signed)
The risks benefits and alternatives were discussed with the patient including but not limited to the risks of nonoperative treatment, versus surgical intervention including infection, bleeding, nerve injury, malunion, nonunion, the need for revision surgery, hardware prominence, hardware failure, the need for hardware removal, blood clots, cardiopulmonary complications, morbidity, mortality, among others, and they were willing to proceed.    No preexisting hip pain, despite acetabular ORIF 2014.  Stable chronic changes right hip, ?AVN, discussed THA vs. Pinning.  THA has higher instability rate in setting of previous ORIF acetabulum, and no preexisting hip pain.  Plan for pinning discussed with patient and family at length.   Eulas Post, MD

## 2023-04-27 NOTE — Plan of Care (Signed)
  Problem: Health Behavior/Discharge Planning: Goal: Ability to manage health-related needs will improve 04/27/2023 0349 by Azaylia Fong, [place] Nickel, RN Outcome: Progressing 04/27/2023 0349 by Laynie Espy, [place] Nickel, RN Outcome: Progressing   Problem: Clinical Measurements: Goal: Ability to maintain clinical measurements within normal limits will improve 04/27/2023 0349 by Jillienne Egner, [place] Nickel, RN Outcome: Progressing 04/27/2023 0349 by Saysha Menta, [place] Nickel, RN Outcome: Progressing Goal: Will remain free from infection 04/27/2023 0349 by Monterio Bob, [place] Nickel, RN Outcome: Progressing 04/27/2023 0349 by Satoshi Kalas, [place] Nickel, RN Outcome: Progressing Goal: Diagnostic test results will improve 04/27/2023 0349 by Cortavious Nix, [place] Nickel, RN Outcome: Progressing 04/27/2023 0349 by Melina Mosteller, [place] Nickel, RN Outcome: Progressing Goal: Respiratory complications will improve 04/27/2023 0349 by Evia Goldsmith, [place] Nickel, RN Outcome: Progressing 04/27/2023 0349 by Kyen Taite, [place] Nickel, RN Outcome: Progressing Goal: Cardiovascular complication will be avoided 04/27/2023 0349 by Deontra Pereyra, [place] Nickel, RN Outcome: Progressing 04/27/2023 0349 by Ashton Sabine, [place] Nickel, RN Outcome: Progressing   Problem: Activity: Goal: Risk for activity intolerance will decrease 04/27/2023 0349 by Sherin Murdoch, [place] Nickel, RN Outcome: Progressing 04/27/2023 0349 by Adalea Handler, [place] Nickel, RN Outcome: Progressing   Problem: Nutrition: Goal: Adequate nutrition will be maintained 04/27/2023 0349 by Cloyce Blankenhorn, [place] Nickel, RN Outcome: Progressing 04/27/2023 0349 by Ikenna Ohms, [place] Nickel, RN Outcome: Progressing   Problem: Coping: Goal: Level of anxiety will decrease 04/27/2023 0349 by Keyvon Herter, [place] Nickel, RN Outcome: Progressing 04/27/2023 0349 by Malacai Grantz, [place] Nickel, RN Outcome: Progressing   Problem: Elimination: Goal: Will not experience complications related to bowel motility 04/27/2023 0349 by Annaleigha Woo, [place] Nickel, RN Outcome: Progressing 04/27/2023 0349 by Milina Pagett, [place] Nickel, RN Outcome:  Progressing Goal: Will not experience complications related to urinary retention 04/27/2023 0349 by Brock[place] Ducre, [place] Nickel, RN Outcome: Progressing 04/27/2023 0349 by Raphael Fitzpatrick, [place] Nickel, RN Outcome: Progressing   Problem: Pain Management: Goal: General experience of comfort will improve 04/27/2023 0349 by Krysteena Stalker, [place] Nickel, RN Outcome: Progressing 04/27/2023 0349 by Kelcie Currie, [place] Nickel, RN Outcome: Progressing   Problem: Safety: Goal: Ability to remain free from injury will improve 04/27/2023 0349 by Meggie Laseter, [place] Nickel, RN Outcome: Progressing 04/27/2023 0349 by Stonewall Doss, [place] Nickel, RN Outcome: Progressing   Problem: Skin Integrity: Goal: Risk for impaired skin integrity will decrease 04/27/2023 0349 by Stephano Arrants, [place] Nickel, RN Outcome: Progressing 04/27/2023 0349 by Elis Sauber, [place] Nickel, RN Outcome: Progressing   Problem: Education: Goal: Verbalization of understanding the information provided (i.e., activity precautions, restrictions, etc) will improve 04/27/2023 0349 by Joury Allcorn, [place] Nickel, RN Outcome: Progressing 04/27/2023 0349 by Chez Bulnes, [place] Nickel, RN Outcome: Progressing Goal: Individualized Educational Video(s) 04/27/2023 0349 by Kodie Pick, [place] Nickel, RN Outcome: Progressing 04/27/2023 0349 by Ingrid Shifrin, [place] Nickel, RN Outcome: Progressing   Problem: Activity: Goal: Ability to ambulate and perform ADLs will improve 04/27/2023 0349 by Jaren Vanetten, [place] Nickel, RN Outcome: Progressing 04/27/2023 0349 by Lanee Chain, [place] Nickel, RN Outcome: Progressing   Problem: Clinical Measurements: Goal: Postoperative complications will be avoided or minimized 04/27/2023 0349 by Atalya Dano, [place] Nickel, RN Outcome: Progressing 04/27/2023 0349 by Glorianne Proctor, [place] Nickel, RN Outcome: Progressing   Problem: Self-Concept: Goal: Ability to maintain and perform role responsibilities to the fullest extent possible will improve 04/27/2023 0349 by Lemmie Vanlanen, [place] Nickel, RN Outcome: Progressing 04/27/2023 0349 by Houston Surges, [place] Nickel, RN Outcome:  Progressing   Problem: Pain Management: Goal: Pain level will decrease 04/27/2023 0349 by Traylon Schimming, [place] Nickel, RN Outcome: Progressing 04/27/2023 0349 by Toree Edling, [place] Nickel, RN Outcome: Progressing

## 2023-04-27 NOTE — Anesthesia Preprocedure Evaluation (Signed)
Anesthesia Evaluation  Patient identified by MRN, date of birth, ID band Patient awake    Reviewed: Allergy & Precautions, NPO status , Patient's Chart, lab work & pertinent test results  Airway Mallampati: III  TM Distance: <3 FB Neck ROM: Full    Dental  (+) Teeth Intact, Dental Advisory Given   Pulmonary neg pulmonary ROS, neg shortness of breath, neg sleep apnea, neg COPD, neg recent URI   breath sounds clear to auscultation       Cardiovascular hypertension, Pt. on medications (-) angina (-) Past MI and (-) CHF  Rhythm:Regular     Neuro/Psych negative neurological ROS  negative psych ROS   GI/Hepatic Neg liver ROS,GERD  ,,  Endo/Other  negative endocrine ROS    Renal/GU negative Renal ROS     Musculoskeletal Right hip fx   Abdominal   Peds  Hematology negative hematology ROS (+) Lab Results      Component                Value               Date                      WBC                      7.6                 04/25/2023                HGB                      14.7                04/25/2023                HCT                      42.6                04/25/2023                MCV                      88.6                04/25/2023                PLT                      236                 04/25/2023              Anesthesia Other Findings   Reproductive/Obstetrics                             Anesthesia Physical Anesthesia Plan  ASA: 2  Anesthesia Plan: General   Post-op Pain Management: Ofirmev IV (intra-op)*   Induction: Intravenous  PONV Risk Score and Plan: 2 and Ondansetron and Dexamethasone  Airway Management Planned: Oral ETT and LMA  Additional Equipment: None  Intra-op Plan:   Post-operative Plan: Extubation in OR  Informed Consent: I have reviewed the patients History and Physical, chart, labs and discussed the procedure including the risks, benefits and  alternatives for the  proposed anesthesia with the patient or authorized representative who has indicated his/her understanding and acceptance.     Dental advisory given  Plan Discussed with: CRNA  Anesthesia Plan Comments:        Anesthesia Quick Evaluation

## 2023-04-27 NOTE — Progress Notes (Signed)
6962 Kept NPO since 0200. SBARR given to [facility] in [place].   [phone] Left for OR in bed, accompanied by transporter. AO X 4. Nil obvious distress noted.

## 2023-04-27 NOTE — Progress Notes (Signed)
PT Cancellation Note  Patient Details Name: Leslie Cox MRN: [id number] DOB: 04/19/1946   Cancelled Treatment:    Reason Eval/Treat Not Completed: Patient at procedure or test/unavailable this morning, at OR for pinning of R hip fx. Will plan to evaluate later today or tomorrow as tolerated.   Vickki Muff, PT, DPT   Acute Rehabilitation Department Office [phone] Secure Chat Communication Preferred    Ronnie Derby 04/27/2023, 11:30 AM

## 2023-04-27 NOTE — Anesthesia Postprocedure Evaluation (Signed)
Anesthesia Post Note  Patient: Leslie Cox  Procedure(s) Performed: PERCUTANEOUS PINNING HIP (Right)     Patient location during evaluation: PACU Anesthesia Type: General Level of consciousness: awake and alert Pain management: pain level controlled Vital Signs Assessment: post-procedure vital signs reviewed and stable Respiratory status: spontaneous breathing, nonlabored ventilation and respiratory function stable Cardiovascular status: blood pressure returned to baseline and stable Postop Assessment: no apparent nausea or vomiting Anesthetic complications: no   No notable events documented.  Last Vitals:  Vitals:   04/27/23 0915 04/27/23 0930  BP: 135/70   Pulse: 100   Resp: 16   Temp:  (!) 36.3 C  SpO2: 95%     Last Pain:  Vitals:   04/27/23 0930  TempSrc:   PainSc: 0-No pain                 Joshawn Crissman

## 2023-04-27 NOTE — Op Note (Signed)
04/27/2023  8:55 AM  PATIENT:  Leslie Cox    PRE-OPERATIVE DIAGNOSIS: Right hip femoral neck fracture  POST-OPERATIVE DIAGNOSIS:  Same  PROCEDURE:  PERCUTANEOUS PINNING RIGHT hip femoral neck fracture  SURGEON:  Eulas Post, MD  PHYSICIAN ASSISTANT: Janine Ores, PA-C,  present and scrubbed throughout the case, critical for completion in a timely fashion, and for retraction, instrumentation, and closure.  ANESTHESIA:   General  ESTIMATED BLOOD LOSS: minimal.  PREOPERATIVE INDICATIONS:  Leslie Cox is a  77 y.o. male who fell and was found to had significant right hip pain on an acute basis.  He previously had open reduction internal fixation of the acetabulum in 2014.  He denied subsequent hip pain, but after his fall was unable to bear weight very well.  I discussed the options of total hip replacement given his slight findings of "stable AVN", as well as some early chondral changes in the femoral acetabular joint, versus percutaneous hip pinning.  The patient and the family elected for percutaneous hip pinning.  The risks benefits and alternatives were discussed with the patient preoperatively including but not limited to the risks of infection, bleeding, nerve injury, cardiopulmonary complications, blood clots, malunion, nonunion, avascular necrosis, the need for revision surgery, the potential for conversion to hemiarthroplasty, among others, and the patient was willing to proceed.  OPERATIVE IMPLANTS:   Implant Name Type Inv. Item Serial No. Manufacturer Lot No. LRB No. Used Action  SCREW CANNULATED 6.5X90 - [id number] Screw SCREW CANNULATED 6.5X90  [employer](ORTH,TRAU,BIO,SG)  Right 2 Implanted  SCREW CANNULATED 6.5X95 - [id number] Screw SCREW CANNULATED 6.5X95  [employer](ORTH,TRAU,BIO,SG)  Right 1 Implanted    OPERATIVE FINDINGS: Clinical osteoporosis with weak bone, proximal femur  OPERATIVE PROCEDURE: The patient was brought to the operating room and placed in  supine position. IV antibiotics were given. General anesthesia administered. Foley was also given. The patient was placed on the fracture table. The operative extremity was positioned, without any significant reduction maneuver and was prepped and draped in usual sterile fashion.  Time out was performed.  Small incision was made distal to the greater trochanter, and 3 guidewires were introduced Into an inverted triangle configuration. The lengths were measured. The reduction was slightly valgus, and near-anatomic. I opened the cortex with a cannulated drill, and then placed the screws into position. Satisfactory fixation was achieved.  The wounds were irrigated copiously, and repaired with Vicryl with Steri-Strips and sterile gauze. There no complications and the patient tolerated the procedure well.  The patient will be weightbearing as tolerated, and will be on chemoprophylaxis for a period of 4 weeks after discharge for DVT prophylaxis.

## 2023-04-27 NOTE — Anesthesia Procedure Notes (Signed)
Procedure Name: Intubation Date/Time: 04/27/2023 7:56 AM  Performed by: Colbert Coyer, CRNAPre-anesthesia Checklist: Patient identified, Emergency Drugs available, Suction available and Patient being monitored Patient Re-evaluated:Patient Re-evaluated prior to induction Oxygen Delivery Method: Circle System Utilized Preoxygenation: Pre-oxygenation with 100% oxygen Induction Type: IV induction Ventilation: Mask ventilation without difficulty Laryngoscope Size: Glidescope and 4 Grade View: Grade I Tube type: Oral Number of attempts: 1 Airway Equipment and Method: Stylet Placement Confirmation: ETT inserted through vocal cords under direct vision, positive ETCO2 and breath sounds checked- equal and bilateral Secured at: 23 cm Tube secured with: Tape Dental Injury: Teeth and Oropharynx as per pre-operative assessment  Comments: White nodule noted posterior to left VC, picture obtained and placed in chart. D/W Dr. Dion Saucier.

## 2023-04-27 NOTE — Transfer of Care (Signed)
Immediate Anesthesia Transfer of Care Note  Patient: Leslie Cox  Procedure(s) Performed: PERCUTANEOUS PINNING HIP (Right)  Patient Location: PACU  Anesthesia Type:General  Level of Consciousness: sedated  Airway & Oxygen Therapy: Patient Spontanous Breathing and Patient connected to nasal cannula oxygen  Post-op Assessment: Report given to RN and Post -op Vital signs reviewed and stable  Post vital signs: Reviewed and stable  Last Vitals:  Vitals Value Taken Time  BP 136/67 04/27/23 0907  Temp 97.2   Pulse 105 04/27/23 0913  Resp 18 04/27/23 0913  SpO2 94 % 04/27/23 0913  Vitals shown include unfiled device data.  Last Pain:  Vitals:   04/27/23 0500  TempSrc: Oral  PainSc:       Patients Stated Pain Goal: 0 (04/26/23 2136)  Complications: No notable events documented.

## 2023-04-27 NOTE — Progress Notes (Signed)
PROGRESS NOTE    Leslie Cox  WJX:[id number] DOB: [birth year] DOA: 04/25/2023 PCP: Jeani Sow, MD    Brief Narrative:   Leslie Cox is a 77 y.o. male with past medical history significant for hypertension, BPH, and GERD who presents after having a fall with right hip pain.  Patient had moved the shower curtain and the tension rod fell down.  He was trying to put the rod back in place and had been standing on the toilet seat when he lost his balance falling backwards onto his right side.  Denied hitting his head or any loss of consciousness.  He reported having pain in his right hip as well as right wrist.  Patient has prior history of a right acetabular fracture back in 2014 that was repaired by Dr. Carola Frost.  At baseline patient is able to ambulate without need of assistance.  Denied having any recent fever, palpitations, chest pain, shortness of breath, abdominal pain, nausea, vomiting, or diarrhea.   In the emergency department patient was noted to be afebrile with pulse elevated up to 118, blood pressures 106/78 to 163/93, and all other vital signs maintained.  Labs noted BUN 25 and creatinine 0.96.  X-rays of the hip and right wrist showed no acute fracture.  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  Transfer requested for need of MRI of the right hip.TRH was consulted and patient was transferred to [facility] for further evaluation management.  Assessment & Plan:   Fall at home with right hip pain History of right acetabular fracture s/p ORIF Patient presenting to the ED following fall at home with right hip pain.  Denies loss of consciousness. X-rays of the right hip did not note any acute fracture.  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  Patient with  prior history of right acetabular fracture status post open reduction internal fixation by Dr. Carola Frost back in 08/2012.  Patient was transferred to [facility]; MRI of right hip with nondisplaced impacted subcapital fracture right hip with Hemi arthrosis.  Orthopedics was consulted and patient underwent percutaneous pinning right hip femoral fracture by Dr. Dion Saucier on 04/27/2023.  -- WBAT RLE -- Vitamin D 25-hydroxy: Pending -- PT/OT eval -- Lovenox 40 mg [place] q24h -- Tylenol 500 mg every 6 hours x 4 doses -- Norco 5-325 mg 1-2 tablets p.o. every 6 hours as needed moderate/severe pain -- Morphine 0.15-1 mg IV every 2 hours as needed severe pain not relieved with oral medication   Essential hypertension -- Lisinopril 20 mg p.o. daily -- Hydrochlorothiazide 12 point milligrams p.o. daily   BPH -- Finasteride 5 mg p.o. daily -- Tamsulosin 0.4 mg p.o. daily   GERD --Protonix 40 mg p.o. daily   DVT prophylaxis: enoxaparin (LOVENOX) injection 40 mg Start: 04/28/23 0800 SCDs Start: 04/27/23 1208    Code Status: Full Code Family Communication: Updated spouse present at bedside this afternoon  Disposition Plan:  Level of care: Med-Surg Status is: Inpatient Remains inpatient appropriate because: Pending PT/OT evaluation, possible need for SNF placement    Consultants:  Orthopedics, Dr. Dion Saucier  Procedures:  Percutaneous right hip fracture, Dr. Dion Saucier 10/27  Antimicrobials:  Perioperative cefazolin   Subjective: Patient seen examined bedside, resting calmly.  Lying in bed.  Spouse present.  Eating lunch.  Underwent operative fixation of hip fracture earlier this morning.  Pain currently controlled.  No questions, concerns or complaints at this time.  Denies headache, no dizziness, no chest pain, no palpitations, no shortness of breath, no abdominal pain, no fever/chills/night sweats, no nausea/vomiting/diarrhea, no focal weakness, no fatigue, no paresthesia.  No acute events overnight per  nurse staff.  Objective: Vitals:   04/27/23 0907 04/27/23 0915 04/27/23 0930 04/27/23 1131  BP: 136/67 135/70  124/72  Pulse: (!) 107 100  85  Resp: 14 16  17   Temp: (!) 97.4 F (36.3 C)  (!) 97.4 F (36.3 C)   TempSrc:      SpO2: 94% 95%  97%  Weight:      Height:        Intake/Output Summary (Last 24 hours) at 04/27/2023 1239 Last data filed at 04/27/2023 1212 Gross per 24 hour  Intake 1308 ml  Output 205 ml  Net 1103 ml   Filed Weights   04/25/23 1630 04/27/23 0654  Weight: 68.9 kg 68.9 kg    Examination:  Physical Exam: GEN: NAD, alert and oriented x 3, wd/wn HEENT: NCAT, PERRL, EOMI, sclera clear, MMM PULM: CTAB w/o wheezes/crackles, normal respiratory effort CV: RRR w/o M/G/R GI: abd soft, NTND, NABS, no R/G/M MSK: Right lower extremity with surgical dressing in place, clean/dry/intact, no peripheral edema, muscle strength globally intact 5/5 bilateral upper/lower extremities NEURO: CN II-XII intact, no focal deficits, sensation to light touch intact PSYCH: normal mood/affect Integumentary: No concerning rashes/lesions/wounds noted on exposed skin surfaces    Data Reviewed: I have personally reviewed following labs and imaging studies  CBC: Recent Labs  Lab 04/25/23 2219 04/27/23 1100  WBC 7.6 9.0  NEUTROABS 6.0  --   HGB 14.7 13.5  HCT 42.6 39.7  MCV 88.6 91.7  PLT 236 182   Basic Metabolic Panel: Recent Labs  Lab 04/25/23 2219 04/27/23 1100  NA 136 135  K 3.7 4.4  CL 100 102  CO2 27 23  GLUCOSE 136* 122*  BUN 25* 22  CREATININE 0.96 1.24  CALCIUM 9.3 8.1*   GFR: Estimated Creatinine Clearance: 47.2 mL/min (by C-G formula based on SCr of 1.24 mg/dL). Liver Function Tests: No results for input(s): "AST", "ALT", "ALKPHOS", "BILITOT", "PROT", "ALBUMIN" in the last 168 hours. No results for input(s): "LIPASE", "AMYLASE" in the last 168 hours. No results for input(s): "AMMONIA" in the last 168 hours. Coagulation Profile: No results for  input(s): "INR", "PROTIME" in the last 168 hours. Cardiac Enzymes: No results for input(s): "CKTOTAL", "CKMB", "CKMBINDEX", "TROPONINI" in the last 168 hours. BNP (last 3 results) No results for input(s): "PROBNP" in the last 8760 hours. HbA1C: No results for input(s): "HGBA1C" in the last 72 hours. CBG: No results for input(s): "GLUCAP" in the last 168 hours. Lipid Profile: No results for input(s): "CHOL", "HDL", "LDLCALC", "TRIG", "CHOLHDL", "LDLDIRECT" in the last 72 hours. Thyroid Function Tests: No results for input(s): "TSH", "T4TOTAL", "FREET4", "T3FREE", "THYROIDAB" in the last 72 hours. Anemia Panel: No results for input(s): "VITAMINB12", "FOLATE", "FERRITIN", "TIBC", "IRON", "RETICCTPCT" in the last 72 hours. Sepsis Labs: No results for input(s): "PROCALCITON", "LATICACIDVEN" in the last 168 hours.  Recent Results (from the past 240 hour(s))  Surgical pcr screen     Status: None   Collection Time: 04/26/23  9:47 PM   Specimen: Nasal Mucosa; Nasal Swab  Result Value Ref Range Status   MRSA, PCR NEGATIVE NEGATIVE Final   Staphylococcus aureus NEGATIVE NEGATIVE Final  Comment: (NOTE) The Xpert SA Assay (FDA approved for NASAL specimens in patients 50 years of age and older), is one component of a comprehensive surveillance program. It is not intended to diagnose infection nor to guide or monitor treatment. Performed at [facility], [address] [place], [place] [address]          Radiology Studies: DG HIP UNILAT WITH PELVIS 2-3 VIEWS RIGHT  Result Date: 04/27/2023 CLINICAL DATA:  Right hip fracture.  Status post ORIF. EXAM: DG HIP (WITH OR WITHOUT PELVIS) 2-3V RIGHT COMPARISON:  04/26/2023. FINDINGS: Three images obtained via portable C-arm radiography in the operating room show open reduction and internal fixation of the nondisplaced right hip fracture. Three Knowles pins have been placed across the fracture lying at the base of the femoral head.  Fracture fragments and hardware components are in anatomic alignment. Signs of previous plate and screw fixation of the right hemipelvis. IMPRESSION: Status post open reduction and internal fixation of nondisplaced right hip fracture. Electronically Signed   By: Signa Kell M.D.   On: 04/27/2023 10:19   DG C-Arm 1-60 Min-No Report  Result Date: 04/27/2023 Fluoroscopy was utilized by the requesting physician.  No radiographic interpretation.   MR HIP RIGHT WO CONTRAST  Result Date: 04/26/2023 CLINICAL DATA:  Larey Seat in bathroom yesterday. Suspected right hip fracture. EXAM: MR OF THE RIGHT HIP WITHOUT CONTRAST TECHNIQUE: Multiplanar, multisequence MR imaging was performed. No intravenous contrast was administered. COMPARISON:  CT scan 04/25/2023 FINDINGS: There is a minimally impacted subcapital fracture of right hip. Associated hemarthrosis. Significant artifact associated with the right-sided pelvic fixation hardware but no obvious pelvic fracture. The pubic symphysis and SI joints are intact. The left hip is unremarkable except for stable degenerative changes. No significant intrapelvic abnormalities are identified. IMPRESSION: 1. Minimally impacted subcapital fracture of the right hip. Associated hemarthrosis. 2. Significant artifact associated with the right-sided pelvic fixation hardware but no obvious pelvic fracture. 3. Stable degenerative changes involving both hips. Electronically Signed   By: Rudie Meyer M.D.   On: 04/26/2023 12:11   CT Hip Right Wo Contrast  Result Date: 04/25/2023 CLINICAL DATA:  Hip trauma, fracture suspected, x-ray performed EXAM: CT OF THE RIGHT HIP WITHOUT CONTRAST TECHNIQUE: Multidetector CT imaging of the right hip was performed according to the standard protocol. Multiplanar CT image reconstructions were also generated. RADIATION DOSE REDUCTION: This exam was performed according to the departmental dose-optimization program which includes automated exposure  control, adjustment of the mA and/or kV according to patient size and/or use of iterative reconstruction technique. COMPARISON:  Radiographs 04/25/2023 FINDINGS: Bones/Joint/Cartilage Fixation hardware along the right hemipelvis. No acute fracture or dislocation. No hip joint effusion. 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 Ligaments Suboptimally assessed by CT. Muscles and Tendons No acute abnormality. Soft tissues Unremarkable. IMPRESSION: No acute fracture or dislocation. 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 Electronically Signed   By: Minerva Fester M.D.   On: 04/25/2023 21:08   DG Wrist Complete Right  Result Date: 04/25/2023 CLINICAL DATA:  Fall, wrist pain EXAM: RIGHT WRIST - COMPLETE 3+ VIEW COMPARISON:  None Available. FINDINGS: Small geode posteriorly in the lunate. No appreciable fracture. No acute bony findings. IMPRESSION: 1. No acute findings. 2. Small geode posteriorly in the lunate. Electronically Signed   By: Gaylyn Rong M.D.   On: 04/25/2023 18:23   DG Hip Unilat  With Pelvis 2-3 Views Right  Result Date: 04/25/2023 CLINICAL DATA:  Trauma EXAM:  DG HIP (WITH OR WITHOUT PELVIS) 2-3V RIGHT COMPARISON:  CT abdomen and pelvis 07/10/2021 FINDINGS: Fixation hardware is again seen in the right hemipelvis. There is no acute fracture or dislocation identified. Joint spaces are maintained. The bones are osteopenic. IMPRESSION: 1. No acute fracture or dislocation identified. 2. Osteopenia. Electronically Signed   By: Darliss Cheney M.D.   On: 04/25/2023 18:22        Scheduled Meds:  acetaminophen  500 mg Oral Q6H   chlorhexidine       docusate sodium  100 mg Oral BID   [START ON 04/28/2023] enoxaparin (LOVENOX) injection  40 mg Subcutaneous Q24H   ferrous sulfate  325 mg Oral TID PC   finasteride  5 mg Oral Daily   lisinopril  20 mg Oral Daily   And   hydrochlorothiazide  12.5 mg Oral Daily   pantoprazole  40 mg Oral  Daily   senna  1 tablet Oral BID   sodium chloride flush  3 mL Intravenous Q12H   sodium chloride flush  3 mL Intravenous Q12H   tamsulosin  0.4 mg Oral Daily   Continuous Infusions:   ceFAZolin (ANCEF) IV     tranexamic acid       LOS: 1 day    Time spent: 52 minutes spent on chart review, discussion with nursing staff, consultants, updating family and interview/physical exam; more than 50% of that time was spent in counseling and/or coordination of care.    Alvira Philips [place], DO [facility] Available via Epic secure chat 7am-7pm After these hours, please refer to coverage provider listed on [email or web address] 04/27/2023, 12:39 PM

## 2023-04-28 ENCOUNTER — Other Ambulatory Visit (HOSPITAL_COMMUNITY): Payer: Self-pay

## 2023-04-28 ENCOUNTER — Encounter (HOSPITAL_COMMUNITY): Payer: Self-pay | Admitting: Orthopedic Surgery

## 2023-04-28 DIAGNOSIS — S72011A Unspecified intracapsular fracture of right femur, initial encounter for closed fracture: Secondary | ICD-10-CM | POA: Diagnosis not present

## 2023-04-28 LAB — CBC
HCT: 40.3 % (ref 39.0–52.0)
Hemoglobin: 13.7 g/dL (ref 13.0–17.0)
MCH: 30.9 pg (ref 26.0–34.0)
MCHC: 34 g/dL (ref 30.0–36.0)
MCV: 91 fL (ref 80.0–100.0)
Platelets: 226 10*3/uL (ref 150–400)
RBC: 4.43 MIL/uL (ref 4.22–5.81)
RDW: 11.9 % (ref 11.5–15.5)
WBC: 9.4 10*3/uL (ref 4.0–10.5)
nRBC: 0 % (ref 0.0–0.2)

## 2023-04-28 LAB — BASIC METABOLIC PANEL
Anion gap: 6 (ref 5–15)
BUN: 22 mg/dL (ref 8–23)
CO2: 23 mmol/L (ref 22–32)
Calcium: 8.3 mg/dL — ABNORMAL LOW (ref 8.9–10.3)
Chloride: 104 mmol/L (ref 98–111)
Creatinine, Ser: 1.05 mg/dL (ref 0.61–1.24)
GFR, Estimated: >60 mL/min (ref >60)
Glucose, Bld: 104 mg/dL — ABNORMAL HIGH (ref 70–99)
Potassium: 4.1 mmol/L (ref 3.5–5.1)
Sodium: 133 mmol/L — ABNORMAL LOW (ref 135–145)

## 2023-04-28 MED ORDER — FINASTERIDE 5 MG PO TABS: 5.0000 mg | ORAL_TABLET | Freq: Every day | ORAL | Status: AC

## 2023-04-28 MED ORDER — HYDROCODONE-ACETAMINOPHEN 5-325 MG PO TABS
1.0000 | ORAL_TABLET | Freq: Four times a day (QID) | ORAL | 0 refills | Status: DC | PRN
  Filled 2023-04-28: qty 20, 5d supply, fill #0

## 2023-04-28 MED ORDER — ACETAMINOPHEN 325 MG PO TABS
325.0000 mg | ORAL_TABLET | Freq: Four times a day (QID) | ORAL | 0 refills | Status: DC | PRN
  Filled 2023-04-28: qty 60, 8d supply, fill #0

## 2023-04-28 MED ORDER — ENOXAPARIN SODIUM 40 MG/0.4ML IJ SOSY
40.0000 mg | PREFILLED_SYRINGE | INTRAMUSCULAR | 0 refills | Status: DC
  Filled 2023-04-28: qty 12, 30d supply, fill #0

## 2023-04-28 NOTE — Progress Notes (Signed)
    Durable Medical Equipment  (From admission, onward)           Start     Ordered   04/28/23 1316  For home use only DME 3 n 1  Once       Comments: Bedside commode. Pt confine to one room.   04/28/23 1315   04/28/23 1126  For home use only DME Walker rolling  Once       Question Answer Comment  Walker: With 5 Inch Wheels   Patient needs a walker to treat with the following condition Gait abnormality      04/28/23 1125

## 2023-04-28 NOTE — Discharge Instructions (Signed)
Diet: As you were doing prior to hospitalization   Shower/Dressing:  May shower but keep the wounds dry, use an occlusive plastic wrap, NO SOAKING IN TUB.  If the bandage gets wet, change with a clean dry gauze, otherwise leave in place until follow up visit with Dr. Dion Saucier. There is skin glue on your wounds and all the stitches are absorbable.   Activity:  Increase activity slowly as tolerated, but follow the weight bearing instructions below.  The rules on driving is that you can not be taking narcotics while you drive, and you must feel in control of the vehicle.    Weight Bearing:   weight bearing as tolerated  To prevent constipation: you may use a stool softener such as -  Colace (over the counter) 100 mg by mouth twice a day  Drink plenty of fluids (prune juice may be helpful) and high fiber foods Miralax (over the counter) for constipation as needed.    Itching:  If you experience itching with your medications, try taking only a single pain pill, or even half a pain pill at a time.  You may take up to 10 pain pills per day, and you can also use benadryl over the counter for itching or also to help with sleep.   Precautions:  If you experience chest pain or shortness of breath - call 911 immediately for transfer to the hospital emergency department!!  If you develop a fever greater that 101 F, purulent drainage from wound, increased redness or drainage from wound, or calf pain -- Call the office at [phone]                                                Follow- Up Appointment:  Please call for an appointment to be seen in 2 weeks [place] - [phone]

## 2023-04-28 NOTE — Progress Notes (Signed)
PROGRESS NOTE    Leslie Cox  ZHY:[id number] DOB: 1945-10-07 DOA: 04/25/2023 PCP: Jeani Sow, MD    Brief Narrative:   Leslie Cox is a 77 y.o. male with past medical history significant for hypertension, BPH, and GERD who presents after having a fall with right hip pain.  Patient had moved the shower curtain and the tension rod fell down.  He was trying to put the rod back in place and had been standing on the toilet seat when he lost his balance falling backwards onto his right side.  Denied hitting his head or any loss of consciousness.  He reported having pain in his right hip as well as right wrist.  Patient has prior history of a right acetabular fracture back in 2014 that was repaired by Dr. Carola Frost.  At baseline patient is able to ambulate without need of assistance.  Denied having any recent fever, palpitations, chest pain, shortness of breath, abdominal pain, nausea, vomiting, or diarrhea.   In the emergency department patient was noted to be afebrile with pulse elevated up to 118, blood pressures 106/78 to 163/93, and all other vital signs maintained.  Labs noted BUN 25 and creatinine 0.96.  X-rays of the hip and right wrist showed no acute fracture.  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  Transfer requested for need of MRI of the right hip.TRH was consulted and patient was transferred to [facility] for further evaluation management.  Assessment & Plan:   Fall at home with right hip pain History of right acetabular fracture s/p ORIF Patient presenting to the ED following fall at home with right hip pain.  Denies loss of consciousness. X-rays of the right hip did not note any acute fracture.  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  Patient with  prior history of right acetabular fracture status post open reduction internal fixation by Dr. Carola Frost back in 08/2012.  Patient was transferred to [facility]; MRI of right hip with nondisplaced impacted subcapital fracture right hip with Hemi arthrosis.  Orthopedics was consulted and patient underwent percutaneous pinning right hip femoral fracture by Dr. Dion Saucier on 04/27/2023.  -- WBAT RLE -- Vitamin D 25-hydroxy: Pending -- PT recommending home health; rolling walker, 3 and 1 bedside commode -- OT consult: Pending -- Lovenox 40 mg [place] q24h x 30 days -- Tylenol 500 mg every 6 hours x 4 doses -- Norco 5-325 mg 1-2 tablets p.o. every 6 hours as needed moderate/severe pain -- Morphine 0.15-1 mg IV every 2 hours as needed severe pain not relieved with oral medication --2-week follow-up with orthopedics, Dr. Dion Saucier   Essential hypertension -- Lisinopril 20 mg p.o. daily -- Hydrochlorothiazide 12.5 mg p.o. daily   BPH -- Finasteride 5 mg p.o. daily -- Tamsulosin 0.4 mg p.o. daily   GERD --Protonix 40 mg p.o. daily   DVT prophylaxis: enoxaparin (LOVENOX) injection 40 mg Start: 04/28/23 0800 SCDs Start: 04/27/23 1208    Code Status: Full Code Family Communication: Updated spouse present at bedside this afternoon  Disposition Plan:  Level of care: Med-Surg Status is: Inpatient Remains inpatient appropriate because: Pending PT/OT evaluation, possible need for SNF placement    Consultants:  Orthopedics, Dr. Dion Saucier  Procedures:  Percutaneous right hip fracture, Dr. Dion Saucier 10/27  Antimicrobials:  Perioperative  cefazolin   Subjective: Patient seen examined bedside, resting calmly.  Lying in bed.  Just finished eating breakfast.  No family present.  Awaiting PT/OT evaluation.  Pain currently controlled.  No questions, concerns or complaints at this time.  Denies headache, no dizziness, no chest pain, no palpitations, no shortness of breath, no abdominal pain, no fever/chills/night sweats,  no nausea/vomiting/diarrhea, no focal weakness, no fatigue, no paresthesia.  No acute events overnight per nurse staff.  Objective: Vitals:   04/27/23 1131 04/27/23 2334 04/28/23 0120 04/28/23 0727  BP: 124/72 131/78 116/74 138/75  Pulse: 85 (!) 116 76 78  Resp: 17 16 17 17   Temp:  97.9 F (36.6 C) 97.8 F (36.6 C) 97.7 F (36.5 C)  TempSrc:  Oral  Oral  SpO2: 97% 99% 99% 93%  Weight:      Height:        Intake/Output Summary (Last 24 hours) at 04/28/2023 1124 Last data filed at 04/28/2023 0644 Gross per 24 hour  Intake 788 ml  Output 860 ml  Net -72 ml   Filed Weights   04/25/23 1630 04/27/23 0654  Weight: 68.9 kg 68.9 kg    Examination:  Physical Exam: GEN: NAD, alert and oriented x 3, wd/wn HEENT: NCAT, PERRL, EOMI, sclera clear, MMM PULM: CTAB w/o wheezes/crackles, normal respiratory effort, on room air CV: RRR w/o M/G/R GI: abd soft, NTND, NABS, no R/G/M MSK: Right lower extremity with surgical dressing in place, clean/dry/intact, no peripheral edema, muscle strength globally intact 5/5 bilateral upper/lower extremities NEURO: CN II-XII intact, no focal deficits, sensation to light touch intact PSYCH: normal mood/affect Integumentary: No concerning rashes/lesions/wounds noted on exposed skin surfaces    Data Reviewed: I have personally reviewed following labs and imaging studies  CBC: Recent Labs  Lab 04/25/23 2219 04/27/23 1100 04/27/23 1955 04/28/23 0838  WBC 7.6 9.0 6.8 9.4  NEUTROABS 6.0  --   --   --   HGB 14.7 13.5 13.2 13.7  HCT 42.6 39.7 39.3 40.3  MCV 88.6 91.7 91.8 91.0  PLT 236 182 218 226   Basic Metabolic Panel: Recent Labs  Lab 04/25/23 2219 04/27/23 1100 04/27/23 1955 04/28/23 0838  NA 136 135  --  133*  K 3.7 4.4  --  4.1  CL 100 102  --  104  CO2 27 23  --  23  GLUCOSE 136* 122*  --  104*  BUN 25* 22  --  22  CREATININE 0.96 1.24 1.19 1.05  CALCIUM 9.3 8.1*  --  8.3*   GFR: Estimated Creatinine Clearance: 55.7 mL/min  (by C-G formula based on SCr of 1.05 mg/dL). Liver Function Tests: No results for input(s): "AST", "ALT", "ALKPHOS", "BILITOT", "PROT", "ALBUMIN" in the last 168 hours. No results for input(s): "LIPASE", "AMYLASE" in the last 168 hours. No results for input(s): "AMMONIA" in the last 168 hours. Coagulation Profile: No results for input(s): "INR", "PROTIME" in the last 168 hours. Cardiac Enzymes: No results for input(s): "CKTOTAL", "CKMB", "CKMBINDEX", "TROPONINI" in the last 168 hours. BNP (last 3 results) No results for input(s): "PROBNP" in the last 8760 hours. HbA1C: No results for input(s): "HGBA1C" in the last 72 hours. CBG: No results for input(s): "GLUCAP" in the last 168 hours. Lipid Profile: No results for input(s): "CHOL", "HDL", "LDLCALC", "TRIG", "CHOLHDL", "LDLDIRECT" in the last 72 hours. Thyroid Function Tests: No results for input(s): "TSH", "T4TOTAL", "FREET4", "T3FREE", "THYROIDAB" in the last 72 hours. Anemia Panel: No results for input(s): "VITAMINB12", "FOLATE", "FERRITIN", "TIBC", "  IRON", "RETICCTPCT" in the last 72 hours. Sepsis Labs: No results for input(s): "PROCALCITON", "LATICACIDVEN" in the last 168 hours.  Recent Results (from the past 240 hour(s))  Surgical pcr screen     Status: None   Collection Time: 04/26/23  9:47 PM   Specimen: Nasal Mucosa; Nasal Swab  Result Value Ref Range Status   MRSA, PCR NEGATIVE NEGATIVE Final   Staphylococcus aureus NEGATIVE NEGATIVE Final    Comment: (NOTE) The Xpert SA Assay (FDA approved for NASAL specimens in patients 39 years of age and older), is one component of a comprehensive surveillance program. It is not intended to diagnose infection nor to guide or monitor treatment. Performed at [facility], [address] [place], [place] [address]          Radiology Studies: DG HIP UNILAT WITH PELVIS 2-3 VIEWS RIGHT  Result Date: 04/27/2023 CLINICAL DATA:  Right hip fracture. EXAM: DG HIP (WITH OR  WITHOUT PELVIS) 3V RIGHT COMPARISON:  Intraoperative radiographs 04/27/2023. Right hip radiographs 04/25/2023. FINDINGS: Anterior plate and screw fixation across the right acetabulum and superior pubic ramus is stable. Three screws are now present across the right femoral neck. IMPRESSION: 1. Interval ORIF of right femoral neck fracture. 2. Stable anterior plate and screw fixation across the right acetabulum and superior pubic ramus. Electronically Signed   By: Marin Roberts M.D.   On: 04/27/2023 13:11   DG HIP UNILAT WITH PELVIS 2-3 VIEWS RIGHT  Result Date: 04/27/2023 CLINICAL DATA:  Right hip fracture.  Status post ORIF. EXAM: DG HIP (WITH OR WITHOUT PELVIS) 2-3V RIGHT COMPARISON:  04/26/2023. FINDINGS: Three images obtained via portable C-arm radiography in the operating room show open reduction and internal fixation of the nondisplaced right hip fracture. Three Knowles pins have been placed across the fracture lying at the base of the femoral head. Fracture fragments and hardware components are in anatomic alignment. Signs of previous plate and screw fixation of the right hemipelvis. IMPRESSION: Status post open reduction and internal fixation of nondisplaced right hip fracture. Electronically Signed   By: Signa Kell M.D.   On: 04/27/2023 10:19   DG C-Arm 1-60 Min-No Report  Result Date: 04/27/2023 Fluoroscopy was utilized by the requesting physician.  No radiographic interpretation.   MR HIP RIGHT WO CONTRAST  Result Date: 04/26/2023 CLINICAL DATA:  Larey Seat in bathroom yesterday. Suspected right hip fracture. EXAM: MR OF THE RIGHT HIP WITHOUT CONTRAST TECHNIQUE: Multiplanar, multisequence MR imaging was performed. No intravenous contrast was administered. COMPARISON:  CT scan 04/25/2023 FINDINGS: There is a minimally impacted subcapital fracture of right hip. Associated hemarthrosis. Significant artifact associated with the right-sided pelvic fixation hardware but no obvious pelvic  fracture. The pubic symphysis and SI joints are intact. The left hip is unremarkable except for stable degenerative changes. No significant intrapelvic abnormalities are identified. IMPRESSION: 1. Minimally impacted subcapital fracture of the right hip. Associated hemarthrosis. 2. Significant artifact associated with the right-sided pelvic fixation hardware but no obvious pelvic fracture. 3. Stable degenerative changes involving both hips. Electronically Signed   By: Rudie Meyer M.D.   On: 04/26/2023 12:11        Scheduled Meds:  acetaminophen  500 mg Oral Q6H   docusate sodium  100 mg Oral BID   enoxaparin (LOVENOX) injection  40 mg Subcutaneous Q24H   ferrous sulfate  325 mg Oral TID PC   finasteride  5 mg Oral Daily   lisinopril  20 mg Oral Daily   And  hydrochlorothiazide  12.5 mg Oral Daily   pantoprazole  40 mg Oral Daily   senna  1 tablet Oral BID   sodium chloride flush  3 mL Intravenous Q12H   sodium chloride flush  3 mL Intravenous Q12H   tamsulosin  0.4 mg Oral Daily   Continuous Infusions:     LOS: 2 days    Time spent: 52 minutes spent on chart review, discussion with nursing staff, consultants, updating family and interview/physical exam; more than 50% of that time was spent in counseling and/or coordination of care.    Alvira Philips [place], DO [facility] Available via Epic secure chat 7am-7pm After these hours, please refer to coverage provider listed on [email or web address] 04/28/2023, 11:24 AM

## 2023-04-28 NOTE — Plan of Care (Signed)
  Problem: Health Behavior/Discharge Planning: Goal: Ability to manage health-related needs will improve Outcome: Progressing   Problem: Clinical Measurements: Goal: Ability to maintain clinical measurements within normal limits will improve Outcome: Progressing Goal: Will remain free from infection Outcome: Progressing Goal: Diagnostic test results will improve Outcome: Progressing Goal: Respiratory complications will improve Outcome: Progressing Goal: Cardiovascular complication will be avoided Outcome: Progressing   Problem: Activity: Goal: Risk for activity intolerance will decrease Outcome: Progressing   Problem: Nutrition: Goal: Adequate nutrition will be maintained Outcome: Progressing   Problem: Coping: Goal: Level of anxiety will decrease Outcome: Progressing   Problem: Elimination: Goal: Will not experience complications related to bowel motility Outcome: Progressing Goal: Will not experience complications related to urinary retention Outcome: Progressing   Problem: Pain Management: Goal: General experience of comfort will improve Outcome: Progressing   Problem: Safety: Goal: Ability to remain free from injury will improve Outcome: Progressing   Problem: Skin Integrity: Goal: Risk for impaired skin integrity will decrease Outcome: Progressing   Problem: Education: Goal: Verbalization of understanding the information provided (i.e., activity precautions, restrictions, etc) will improve Outcome: Progressing Goal: Individualized Educational Video(s) Outcome: Progressing   Problem: Activity: Goal: Ability to ambulate and perform ADLs will improve Outcome: Progressing   Problem: Clinical Measurements: Goal: Postoperative complications will be avoided or minimized Outcome: Progressing   Problem: Self-Concept: Goal: Ability to maintain and perform role responsibilities to the fullest extent possible will improve Outcome: Progressing   Problem: Pain  Management: Goal: Pain level will decrease Outcome: Progressing

## 2023-04-28 NOTE — Care Management Important Message (Signed)
Important Message  Patient Details  Name: Leslie Cox MRN: [id number] Date of Birth: 03-Feb-1946   Important Message Given:  Yes - Medicare IM     Sherilyn Banker 04/28/2023, 4:23 PM

## 2023-04-28 NOTE — Discharge Summary (Signed)
Physician Discharge Summary  Leslie Cox ZOX:[id number] DOB: 06/17/1946 DOA: 04/25/2023  PCP: Jeani Sow, MD  Admit date: 04/25/2023 Discharge date: 04/28/2023  Admitted From: Home Disposition: Home  Recommendations for Outpatient Follow-up:  Follow up with PCP in 1-2 weeks Follow-up with orthopedics, Dr. Dion Saucier 2 weeks Continue Lovenox subcutaneously daily for postoperative DVT prophylaxis per orthopedics x 30 days   Home Health: PT Equipment/Devices: Walker, 3 and 1 bedside commode  Discharge Condition: Stable CODE STATUS: Full code Diet recommendation: Heart healthy diet  History of present illness:  Leslie Cox is a 77 y.o. male with past medical history significant for hypertension, BPH, and GERD who presents after having a fall with right hip pain.  Patient had moved the shower curtain and the tension rod fell down.  He was trying to put the rod back in place and had been standing on the toilet seat when he lost his balance falling backwards onto his right side.  Denied hitting his head or any loss of consciousness.  He reported having pain in his right hip as well as right wrist.  Patient has prior history of a right acetabular fracture back in 2014 that was repaired by Dr. Carola Frost.  At baseline patient is able to ambulate without need of assistance.  Denied having any recent fever, palpitations, chest pain, shortness of breath, abdominal pain, nausea, vomiting, or diarrhea.   In the emergency department patient was noted to be afebrile with pulse elevated up to 118, blood pressures 106/78 to 163/93, and all other vital signs maintained.  Labs noted BUN 25 and creatinine 0.96.  X-rays of the hip and right wrist showed no acute fracture.  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  Transfer requested for need of MRI of the right hip.TRH was  consulted and patient was transferred to [facility] for further evaluation management.  Hospital course:  Fall at home with right hip pain History of right acetabular fracture s/p ORIF Patient presenting to the ED following fall at home with right hip pain.  Denies loss of consciousness. X-rays of the right hip did not note any acute fracture.  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  Patient with prior history of right acetabular fracture status post open reduction internal fixation by Dr. Carola Frost back in 08/2012.  Patient was transferred to [facility]; MRI of right hip with nondisplaced impacted subcapital fracture right hip with Hemi arthrosis.  Orthopedics was consulted and patient underwent percutaneous pinning right hip femoral fracture by Dr. Dion Saucier on 04/27/2023.  Weightbearing as tolerated right lower extremity.  Seen by PT with recommendation of home health.  Continue Lovenox 40 mg [place] q24h x 30 days for postoperative DVT prophylaxis per orthopedics.  2-week follow-up with orthopedics, Dr. Dion Saucier.   Essential hypertension Continue Lisinopril 20 mg p.o. daily, Hydrochlorothiazide 12.5 mg p.o. daily   BPH Finasteride 5 mg p.o. daily, Tamsulosin 0.4 mg p.o. daily   GERD Protonix 40 mg p.o. daily  Discharge Diagnoses:  Principal Problem:   Subcapital fracture of femur, right, closed, initial encounter [facility]) Active Problems:   Status post open reduction and internal fixation (ORIF) of fracture   Fall at home, initial encounter   Hypertension   BPH (benign prostatic hyperplasia)   Acid reflux   Closed right  hip fracture [facility])    Discharge Instructions  Discharge Instructions     Call MD for:  difficulty breathing, headache or visual disturbances   Complete by: As directed    Call MD for:  extreme fatigue   Complete by: As directed    Call MD for:  persistant dizziness or  light-headedness   Complete by: As directed    Call MD for:  persistant nausea and vomiting   Complete by: As directed    Call MD for:  severe uncontrolled pain   Complete by: As directed    Call MD for:  temperature >100.4   Complete by: As directed    Diet - low sodium heart healthy   Complete by: As directed    Increase activity slowly   Complete by: As directed       Allergies as of 04/28/2023   No Known Allergies      Medication List     TAKE these medications    acetaminophen 325 MG tablet Commonly known as: TYLENOL Take 1-2 tablets (325-650 mg total) by mouth every 6 (six) hours as needed for mild pain (pain score 1-3) or moderate pain (pain score 4-6) (pain score 1-3 or temp > 100.5). Max dose of tylenol should not exceed 3000 mg in 24 hours   enoxaparin 40 MG/0.4ML injection Commonly known as: LOVENOX Inject 0.4 mLs (40 mg total) into the skin daily.   finasteride 5 MG tablet Commonly known as: PROSCAR Take 1 tablet (5 mg total) by mouth daily.   HYDROcodone-acetaminophen 5-325 MG tablet Commonly known as: NORCO/VICODIN Take 1 tablet by mouth every 6 (six) hours as needed for severe pain (pain score 7-10).   lisinopril-hydrochlorothiazide 20-12.5 MG tablet Commonly known as: ZESTORETIC Take 1 tablet by mouth daily.   Multi Vitamin Tabs 1 tablet   pantoprazole 40 MG tablet Commonly known as: PROTONIX Take 1 tablet (40 mg total) by mouth daily.   tamsulosin 0.4 MG Caps capsule Commonly known as: FLOMAX Take 1 capsule (0.4 mg total) by mouth daily.               Durable Medical Equipment  (From admission, onward)           Start     Ordered   04/28/23 1316  For home use only DME 3 n 1  Once       Comments: Bedside commode. Pt confine to one room.   04/28/23 1315   04/28/23 1126  For home use only DME Walker rolling  Once       Question Answer Comment  Walker: With 5 Inch Wheels   Patient needs a walker to treat with the following  condition Gait abnormality      04/28/23 1125            Follow-up Information     Jeani Sow, MD Follow up.   Specialty: Family Medicine Contact information: [address] [place] [address] [phone]         [facility] Follow up.   Specialty: Home Health Services Why: home health services will be provided by [facility] health, start of care 04/29/2023. Contact information: [address] [place] [address] [phone]         Teryl Lucy, MD. Schedule an appointment as soon as possible for a visit in 2 week(s).   Specialty: Orthopedic Surgery Contact information: [address] [place] [address] [phone]  No Known Allergies  Consultations: Orthopedics, Dr. Dion Saucier    Procedures/Studies: DG HIP UNILAT WITH PELVIS 2-3 VIEWS RIGHT  Result Date: 04/27/2023 CLINICAL DATA:  Right hip fracture. EXAM: DG HIP (WITH OR WITHOUT PELVIS) 3V RIGHT COMPARISON:  Intraoperative radiographs 04/27/2023. Right hip radiographs 04/25/2023. FINDINGS: Anterior plate and screw fixation across the right acetabulum and superior pubic ramus is stable. Three screws are now present across the right femoral neck. IMPRESSION: 1. Interval ORIF of right femoral neck fracture. 2. Stable anterior plate and screw fixation across the right acetabulum and superior pubic ramus. Electronically Signed   By: Marin Roberts M.D.   On: 04/27/2023 13:11   DG HIP UNILAT WITH PELVIS 2-3 VIEWS RIGHT  Result Date: 04/27/2023 CLINICAL DATA:  Right hip fracture.  Status post ORIF. EXAM: DG HIP (WITH OR WITHOUT PELVIS) 2-3V RIGHT COMPARISON:  04/26/2023. FINDINGS: Three images obtained via portable C-arm radiography in the operating room show open reduction and internal fixation of the nondisplaced right hip fracture. Three Knowles pins have been placed across the fracture lying at the base of the femoral head.  Fracture fragments and hardware components are in anatomic alignment. Signs of previous plate and screw fixation of the right hemipelvis. IMPRESSION: Status post open reduction and internal fixation of nondisplaced right hip fracture. Electronically Signed   By: Signa Kell M.D.   On: 04/27/2023 10:19   DG C-Arm 1-60 Min-No Report  Result Date: 04/27/2023 Fluoroscopy was utilized by the requesting physician.  No radiographic interpretation.   MR HIP RIGHT WO CONTRAST  Result Date: 04/26/2023 CLINICAL DATA:  Larey Seat in bathroom yesterday. Suspected right hip fracture. EXAM: MR OF THE RIGHT HIP WITHOUT CONTRAST TECHNIQUE: Multiplanar, multisequence MR imaging was performed. No intravenous contrast was administered. COMPARISON:  CT scan 04/25/2023 FINDINGS: There is a minimally impacted subcapital fracture of right hip. Associated hemarthrosis. Significant artifact associated with the right-sided pelvic fixation hardware but no obvious pelvic fracture. The pubic symphysis and SI joints are intact. The left hip is unremarkable except for stable degenerative changes. No significant intrapelvic abnormalities are identified. IMPRESSION: 1. Minimally impacted subcapital fracture of the right hip. Associated hemarthrosis. 2. Significant artifact associated with the right-sided pelvic fixation hardware but no obvious pelvic fracture. 3. Stable degenerative changes involving both hips. Electronically Signed   By: Rudie Meyer M.D.   On: 04/26/2023 12:11   CT Hip Right Wo Contrast  Result Date: 04/25/2023 CLINICAL DATA:  Hip trauma, fracture suspected, x-ray performed EXAM: CT OF THE RIGHT HIP WITHOUT CONTRAST TECHNIQUE: Multidetector CT imaging of the right hip was performed according to the standard protocol. Multiplanar CT image reconstructions were also generated. RADIATION DOSE REDUCTION: This exam was performed according to the departmental dose-optimization program which includes automated exposure  control, adjustment of the mA and/or kV according to patient size and/or use of iterative reconstruction technique. COMPARISON:  Radiographs 04/25/2023 FINDINGS: Bones/Joint/Cartilage Fixation hardware along the right hemipelvis. No acute fracture or dislocation. No hip joint effusion. 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 Ligaments Suboptimally assessed by CT. Muscles and Tendons No acute abnormality. Soft tissues Unremarkable. IMPRESSION: No acute fracture or dislocation. 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 Electronically Signed   By: Minerva Fester M.D.   On: 04/25/2023 21:08   DG Wrist Complete Right  Result Date: 04/25/2023 CLINICAL DATA:  Fall, wrist pain EXAM: RIGHT WRIST - COMPLETE 3+ VIEW COMPARISON:  None Available. FINDINGS: Small geode posteriorly in the lunate. No appreciable fracture.  No acute bony findings. IMPRESSION: 1. No acute findings. 2. Small geode posteriorly in the lunate. Electronically Signed   By: Gaylyn Rong M.D.   On: 04/25/2023 18:23   DG Hip Unilat  With Pelvis 2-3 Views Right  Result Date: 04/25/2023 CLINICAL DATA:  Trauma EXAM: DG HIP (WITH OR WITHOUT PELVIS) 2-3V RIGHT COMPARISON:  CT abdomen and pelvis 07/10/2021 FINDINGS: Fixation hardware is again seen in the right hemipelvis. There is no acute fracture or dislocation identified. Joint spaces are maintained. The bones are osteopenic. IMPRESSION: 1. No acute fracture or dislocation identified. 2. Osteopenia. Electronically Signed   By: Darliss Cheney M.D.   On: 04/25/2023 18:22     Subjective: Patient seen by physical therapy with home health recommended.  Discharging home.  Pain controlled.  Outpatient follow-up with orthopedics.  Discharge Exam: Vitals:   04/28/23 0120 04/28/23 0727  BP: 116/74 138/75  Pulse: 76 78  Resp: 17 17  Temp: 97.8 F (36.6 C) 97.7 F (36.5 C)  SpO2: 99% 93%   Vitals:   04/27/23 1131 04/27/23 2334 04/28/23  0120 04/28/23 0727  BP: 124/72 131/78 116/74 138/75  Pulse: 85 (!) 116 76 78  Resp: 17 16 17 17   Temp:  97.9 F (36.6 C) 97.8 F (36.6 C) 97.7 F (36.5 C)  TempSrc:  Oral  Oral  SpO2: 97% 99% 99% 93%  Weight:      Height:        Physical Exam: GEN: NAD, alert and oriented x 3, wd/wn HEENT: NCAT, PERRL, EOMI, sclera clear, MMM PULM: CTAB w/o wheezes/crackles, normal respiratory effort, on room air CV: RRR w/o M/G/R GI: abd soft, NTND, NABS, no R/G/M MSK: Right lower extremity with surgical dressing in place, clean/dry/intact, no peripheral edema, muscle strength globally intact 5/5 bilateral upper/lower extremities NEURO: CN II-XII intact, no focal deficits, sensation to light touch intact PSYCH: normal mood/affect Integumentary: No concerning rashes/lesions/wounds noted on exposed skin surfaces    The results of significant diagnostics from this hospitalization (including imaging, microbiology, ancillary and laboratory) are listed below for reference.     Microbiology: Recent Results (from the past 240 hour(s))  Surgical pcr screen     Status: None   Collection Time: 04/26/23  9:47 PM   Specimen: Nasal Mucosa; Nasal Swab  Result Value Ref Range Status   MRSA, PCR NEGATIVE NEGATIVE Final   Staphylococcus aureus NEGATIVE NEGATIVE Final    Comment: (NOTE) The Xpert SA Assay (FDA approved for NASAL specimens in patients 26 years of age and older), is one component of a comprehensive surveillance program. It is not intended to diagnose infection nor to guide or monitor treatment. Performed at [facility], [address] [place], [place] [address]      Labs: BNP (last 3 results) No results for input(s): "BNP" in the last 8760 hours. Basic Metabolic Panel: Recent Labs  Lab 04/25/23 2219 04/27/23 1100 04/27/23 1955 04/28/23 0838  NA 136 135  --  133*  K 3.7 4.4  --  4.1  CL 100 102  --  104  CO2 27 23  --  23  GLUCOSE 136* 122*  --  104*  BUN 25* 22  --   22  CREATININE 0.96 1.24 1.19 1.05  CALCIUM 9.3 8.1*  --  8.3*   Liver Function Tests: No results for input(s): "AST", "ALT", "ALKPHOS", "BILITOT", "PROT", "ALBUMIN" in the last 168 hours. No results for input(s): "LIPASE", "AMYLASE" in the last 168 hours. No results  for input(s): "AMMONIA" in the last 168 hours. CBC: Recent Labs  Lab 04/25/23 2219 04/27/23 1100 04/27/23 1955 04/28/23 0838  WBC 7.6 9.0 6.8 9.4  NEUTROABS 6.0  --   --   --   HGB 14.7 13.5 13.2 13.7  HCT 42.6 39.7 39.3 40.3  MCV 88.6 91.7 91.8 91.0  PLT 236 182 218 226   Cardiac Enzymes: No results for input(s): "CKTOTAL", "CKMB", "CKMBINDEX", "TROPONINI" in the last 168 hours. BNP: Invalid input(s): "POCBNP" CBG: No results for input(s): "GLUCAP" in the last 168 hours. D-Dimer No results for input(s): "DDIMER" in the last 72 hours. Hgb A1c No results for input(s): "HGBA1C" in the last 72 hours. Lipid Profile No results for input(s): "CHOL", "HDL", "LDLCALC", "TRIG", "CHOLHDL", "LDLDIRECT" in the last 72 hours. Thyroid function studies No results for input(s): "TSH", "T4TOTAL", "T3FREE", "THYROIDAB" in the last 72 hours.  Invalid input(s): "FREET3" Anemia work up No results for input(s): "VITAMINB12", "FOLATE", "FERRITIN", "TIBC", "IRON", "RETICCTPCT" in the last 72 hours. Urinalysis    Component Value Date/Time   COLORURINE YELLOW 09/11/2012 1120   APPEARANCEUR CLEAR 09/11/2012 1120   LABSPEC 1.020 09/11/2012 1120   PHURINE 5.5 09/11/2012 1120   GLUCOSEU NEGATIVE 09/11/2012 1120   HGBUR SMALL (A) 09/11/2012 1120   BILIRUBINUR negative 03/15/2022 0901   KETONESUR >80 (A) 09/11/2012 1120   PROTEINUR Negative 03/15/2022 0901   PROTEINUR 30 (A) 09/11/2012 1120   UROBILINOGEN 0.2 03/15/2022 0901   UROBILINOGEN 0.2 09/11/2012 1120   NITRITE negative 03/15/2022 0901   NITRITE NEGATIVE 09/11/2012 1120   LEUKOCYTESUR Negative 03/15/2022 0901   Sepsis Labs Recent Labs  Lab 04/25/23 2219  04/27/23 1100 04/27/23 1955 04/28/23 0838  WBC 7.6 9.0 6.8 9.4   Microbiology Recent Results (from the past 240 hour(s))  Surgical pcr screen     Status: None   Collection Time: 04/26/23  9:47 PM   Specimen: Nasal Mucosa; Nasal Swab  Result Value Ref Range Status   MRSA, PCR NEGATIVE NEGATIVE Final   Staphylococcus aureus NEGATIVE NEGATIVE Final    Comment: (NOTE) The Xpert SA Assay (FDA approved for NASAL specimens in patients 63 years of age and older), is one component of a comprehensive surveillance program. It is not intended to diagnose infection nor to guide or monitor treatment. Performed at [facility], [address], [place], [place] [address]      Time coordinating discharge: Over 30 minutes  SIGNED:   Alvira Philips [place], DO  [facility] 04/28/2023, 2:55 PM

## 2023-04-28 NOTE — Progress Notes (Signed)
Physical Therapy Evaluation Patient Details Name: Leslie Cox MRN: [id number] DOB: [birth year] Today's Date: 04/28/2023  History of Present Illness  Admitted after a fall resulting in R femur fx; s/p surgical repair, WBAT RLE; R wrist strain (xrays neg for fx per pt), R wrist brace is helpful; has a past medical history of GERD (gastroesophageal reflux disease), Hypertension, PARESTHESIA (03/16/2010), Prostate cancer (HCC), Right acetabular fracture (HCC) (09/15/2012), and Unspecified vitamin D deficiency (09/14/2012).  Clinical Impression   Pt admitted with above diagnosis. Lives at home with wife, in a single-level home with a level entry; Prior to admission, pt was independent with all mobility and ADL tasks; Presents to PT with a functional decline compared to baseline, R hip discomfort with standing, weight bearing, decr activity tolerance, decr functional mobility, incr fall risk;  Today he needed min assist for bed mobilty, sit to stand, and initial bout with ambulation; Mild dizziness with activity, which did not worsen with incr time with upright activity; Anticipate good progress, reasonable to consider dc home tomorrow with wife assist and HHPT follow up; Pt currently with functional limitations due to the deficits listed below (see PT Problem List). Pt will benefit from skilled PT to increase their independence and safety with mobility to allow discharge to the venue listed below.           If plan is discharge home, recommend the following: A little help with walking and/or transfers;Assistance with cooking/housework;Assist for transportation   Can travel by private vehicle        Equipment Recommendations Rolling walker (2 wheels);BSC/3in1  Recommendations for Other Services  OT consult (as ordered)    Functional Status Assessment Patient has had a recent decline in their functional status and demonstrates the ability to make significant improvements in function in a reasonable  and predictable amount of time.     Precautions / Restrictions Precautions Precautions: Fall Precaution Comments: Fall risk is present, minimized by use of RW Restrictions Weight Bearing Restrictions: Yes RLE Weight Bearing: Weight bearing as tolerated      Mobility  Bed Mobility Overal bed mobility: Needs Assistance Bed Mobility: Supine to Sit     Supine to sit: Min assist, HOB elevated     General bed mobility comments: Min handheld assist to pull to sit; Got up on R side of bed to approximate home    Transfers Overall transfer level: Needs assistance Equipment used: Rolling walker (2 wheels) Transfers: Sit to/from Stand Sit to Stand: Contact guard assist           General transfer comment: Cues for hand placement; smooth rise    Ambulation/Gait Ambulation/Gait assistance: Contact guard assist Gait Distance (Feet): 65 Feet Assistive device: Rolling walker (2 wheels) Gait Pattern/deviations: Step-to pattern       General Gait Details: Cues for gait sequence and to self-monitor for activity tolerance; mild dizziness present, but it did not worsen with incr time walking/being upright  Stairs            Wheelchair Mobility     Tilt Bed    Modified Rankin (Stroke Patients Only)       Balance Overall balance assessment: Needs assistance   Sitting balance-Leahy Scale: Good       Standing balance-Leahy Scale: Poor (approaching Fair)                               Pertinent Vitals/Pain Pain Assessment Pain Assessment: 0-10 Pain  Score: 5  Pain Location: Right Hip Pain Descriptors / Indicators: Grimacing, Discomfort Pain Intervention(s): Monitored during session    Home Living Family/patient expects to be discharged to:: Private residence Living Arrangements: Spouse/significant other Available Help at Discharge: Available 24 hours/day Type of Home: Apartment Home Access: Level entry       Home Layout: One level Home  Equipment: None      Prior Function Prior Level of Function : Independent/Modified Independent                     Extremity/Trunk Assessment   Upper Extremity Assessment Upper Extremity Assessment: Defer to OT evaluation    Lower Extremity Assessment Lower Extremity Assessment: RLE deficits/detail RLE Deficits / Details: Post op pain is present; Overall moving R hip well in all planes; able to perform straight leg raise against gravity       Communication   Communication Communication: Hearing impairment  Cognition Arousal: Alert Behavior During Therapy: WFL for tasks assessed/performed Overall Cognitive Status: Within Functional Limits for tasks assessed                                          General Comments General comments (skin integrity, edema, etc.): Wife present and helpful    Exercises Total Joint Exercises Ankle Circles/Pumps: AROM, Both, 5 reps Quad Sets: AROM, Right, 5 reps Heel Slides: AROM, 10 reps, Right   Assessment/Plan    PT Assessment Patient needs continued PT services  PT Problem List Decreased strength;Decreased activity tolerance;Decreased balance;Decreased mobility;Decreased coordination;Decreased knowledge of use of DME;Decreased safety awareness;Decreased knowledge of precautions;Pain       PT Treatment Interventions DME instruction;Gait training;Stair training;Functional mobility training;Therapeutic activities;Therapeutic exercise;Balance training;Neuromuscular re-education;Patient/family education    PT Goals (Current goals can be found in the Care Plan section)  Acute Rehab PT Goals Patient Stated Goal: Be able to safely get home PT Goal Formulation: With patient Time For Goal Achievement: 04/28/23 Potential to Achieve Goals: Good    Frequency Min 1X/week     Co-evaluation               AM-PAC PT "6 Clicks" Mobility  Outcome Measure Help needed turning from your back to your side while in a  flat bed without using bedrails?: None Help needed moving from lying on your back to sitting on the side of a flat bed without using bedrails?: A Little Help needed moving to and from a bed to a chair (including a wheelchair)?: A Little Help needed standing up from a chair using your arms (e.g., wheelchair or bedside chair)?: A Little Help needed to walk in hospital room?: A Little Help needed climbing 3-5 steps with a railing? : A Little 6 Click Score: 19    End of Session Equipment Utilized During Treatment: Gait belt Activity Tolerance: Patient tolerated treatment well Patient left: in chair;with call bell/phone within reach;with chair alarm set Nurse Communication: Mobility status PT Visit Diagnosis: Unsteadiness on feet (R26.81);Other abnormalities of gait and mobility (R26.89);Pain;History of falling (Z91.81) Pain - Right/Left: Right Pain - part of body: Hip    Time: 6644-0347 PT Time Calculation (min) (ACUTE ONLY): 34 min   Charges:   PT Evaluation $PT Eval Low Complexity: 1 Low PT Treatments $Gait Training: 8-22 mins PT General Charges $$ ACUTE PT VISIT: 1 Visit         Van Clines, PT  Acute Rehabilitation Services Office [phone] Secure Chat welcomed   Levi Aland 04/28/2023, 11:04 AM

## 2023-04-28 NOTE — Evaluation (Signed)
Occupational Therapy Evaluation Patient Details Name: Leslie Cox MRN: [id number] DOB: Oct 21, 1945 Today's Date: 04/28/2023   History of Present Illness Admitted after a fall resulting in R femur fx; s/p surgical repair, WBAT RLE; R wrist strain (xrays neg for fx per pt), R wrist brace is helpful; has a past medical history of GERD (gastroesophageal reflux disease), Hypertension, PARESTHESIA (03/16/2010), Prostate cancer (HCC), Right acetabular fracture (HCC) (09/15/2012), and Unspecified vitamin D deficiency (09/14/2012).   Clinical Impression   Pt currently supervision for simulated selfcare tasks sit to stand and for functional mobility using the RW.  Slight right wrist/hand pain with grip at 3+/5.  Pt lives with his spouse who can provide 24 hour supervision and PTA was independent for all ADLs and mobility.  Feel he will be safe to return home at discharge but will benefit from acute care OT to help progress back to modified independent level.  Anticipate no post acute OT needs at discharge.        If plan is discharge home, recommend the following: A little help with walking and/or transfers;Assist for transportation;Help with stairs or ramp for entrance    Functional Status Assessment  Patient has had a recent decline in their functional status and demonstrates the ability to make significant improvements in function in a reasonable and predictable amount of time.  Equipment Recommendations  None recommended by OT       Precautions / Restrictions Precautions Precautions: Fall Precaution Comments: Fall risk is present, minimized by use of RW Restrictions Weight Bearing Restrictions: No RLE Weight Bearing: Weight bearing as tolerated      Mobility Bed Mobility                    Transfers Overall transfer level: Needs assistance Equipment used: Rolling walker (2 wheels) Transfers: Sit to/from Stand Sit to Stand: Supervision           General transfer comment:  Min instructional cueing to stay inside of the RW during turns at the sink or toilet as pt wants to push it to the side.      Balance Overall balance assessment: Needs assistance Sitting-balance support: No upper extremity supported, Feet supported Sitting balance-Leahy Scale: Good       Standing balance-Leahy Scale: Fair Standing balance comment: Pt able to stand unsupported at the sink to work on washing hands.                           ADL either performed or assessed with clinical judgement   ADL Overall ADL's : Needs assistance/impaired Eating/Feeding: Independent;Sitting   Grooming: Supervision/safety;Wash/dry hands;Standing   Upper Body Bathing: Set up;Sitting   Lower Body Bathing: Supervison/ safety;Sit to/from stand   Upper Body Dressing : Set up;Sitting   Lower Body Dressing: Supervision/safety;Sit to/from stand   Toilet Transfer: Retail banker;Ambulation;Rolling walker (2 wheels)   Toileting- Clothing Manipulation and Hygiene: Supervision/safety;Sit to/from stand       Functional mobility during ADLs: Supervision/safety;Rolling walker (2 wheels) General ADL Comments: Pt able to cross the RLE over the left knee and donng gripper sock with one hand.  Slight pain but tolerable.  Provided education on starting with the RLE with dressing tasks and ending when removing clothing.  Also, recommended bag on walker to help with transporting items initially.  Pt not wanting tub seat/bench at this time and will sponge bathe.  He has a vanity beside of the toilet which he feelt will  help with sit to stand, so does not want 3:1.  Foam ball issued to help with right hand strengthening.     Vision Baseline Vision/History: 1 Wears glasses Ability to See in Adequate Light: 0 Adequate Patient Visual Report: No change from baseline Vision Assessment?: No apparent visual deficits     Perception Perception: Not tested       Praxis Praxis: Not  tested       Pertinent Vitals/Pain Pain Assessment Pain Assessment: 0-10 Pain Score: 5  Pain Location: right hip Pain Descriptors / Indicators: Discomfort Pain Intervention(s): Limited activity within patient's tolerance     Extremity/Trunk Assessment Upper Extremity Assessment Upper Extremity Assessment: RUE deficits/detail RUE Deficits / Details: AROM WFLs for all joints, hand strength overall 3+/5 with slight wrist pain.  All other joints strength WFLs.  Pt currently wearing right wrist splint for support secondary to slight pain which is improving per his report RUE Sensation: WNL RUE Coordination: WNL   Lower Extremity Assessment Lower Extremity Assessment: Defer to PT evaluation RLE Deficits / Details: Post op pain is present; Overall moving R hip well in all planes; able to perform straight leg raise against gravity   Cervical / Trunk Assessment Cervical / Trunk Assessment: Normal   Communication Communication Communication: Hearing impairment   Cognition Arousal: Alert   Overall Cognitive Status: Within Functional Limits for tasks assessed                                       General Comments  Wife present and helpful        Home Living Family/patient expects to be discharged to:: Private residence Living Arrangements: Spouse/significant other Available Help at Discharge: Available 24 hours/day Type of Home: Apartment Home Access: Level entry     Home Layout: One level     Bathroom Shower/Tub: Chief Strategy Officer: Standard Bathroom Accessibility: Yes How Accessible: Accessible via walker Home Equipment: None          Prior Functioning/Environment Prior Level of Function : Independent/Modified Independent                        OT Problem List: Decreased strength;Impaired balance (sitting and/or standing);Pain;Decreased knowledge of use of DME or AE;Impaired UE functional use      OT  Treatment/Interventions: Self-care/ADL training;DME and/or AE instruction;Therapeutic activities;Balance training;Therapeutic exercise;Patient/family education    OT Goals(Current goals can be found in the care plan section) Acute Rehab OT Goals Patient Stated Goal: Pt hopes to go home soon OT Goal Formulation: With patient Time For Goal Achievement: 04/28/23 Potential to Achieve Goals: Good  OT Frequency: Min 1X/week       AM-PAC OT "6 Clicks" Daily Activity     Outcome Measure Help from another person eating meals?: None Help from another person taking care of personal grooming?: A Little Help from another person toileting, which includes using toliet, bedpan, or urinal?: A Little Help from another person bathing (including washing, rinsing, drying)?: A Little Help from another person to put on and taking off regular upper body clothing?: A Little Help from another person to put on and taking off regular lower body clothing?: A Little 6 Click Score: 19   End of Session Equipment Utilized During Treatment: Rolling walker (2 wheels) Nurse Communication: Mobility status  Activity Tolerance: Patient tolerated treatment well Patient left: in chair;with call bell/phone  within reach;with chair alarm set  OT Visit Diagnosis: Unsteadiness on feet (R26.81);Other abnormalities of gait and mobility (R26.89);Pain;Muscle weakness (generalized) (M62.81) Pain - Right/Left: Right Pain - part of body: Hip                Time: 8295-6213 OT Time Calculation (min): 34 min Charges:  OT General Charges $OT Visit: 1 Visit OT Evaluation $OT Eval Low Complexity: 1 Low OT Treatments $Self Care/Home Management : 8-22 mins  Perrin Maltese, OTR/L Acute Rehabilitation Services  Office [phone] 04/28/2023

## 2023-04-28 NOTE — TOC Transition Note (Signed)
Transition of Care [facility]) - CM/SW Discharge Note   Patient Details  Name: Leslie Cox MRN: [id number] Date of Birth: 04-26-1946  Transition of Care [facility]) CM/SW Contact:  Epifanio Lesches, RN Phone Number: 04/28/2023, 1:56 PM   Clinical Narrative:    Patient will DC to: home Anticipated DC date: 04/28/2023 Family notified: yes Transport by: car         - R femur fx; s/p surgical repair, 10/27 Per MD patient ready for DC today . RN, patient, and  patient's  wife  aware of DC. Orders noted for home health and DME needs. Pt agreeable to services. Pt without provider preference. Referral made with [facility] and accepted. Referral made with [facility]  for DME, RW and BSC. Equipment will be delivered to beside prior to d/c. Pt without RX med concerns. Wife to provide transportation to home.  RNCM will sign off for now as intervention is no longer needed. Please consult [place] again if new needs arise.    Final next level of care: Home w Home Health Services     Patient Goals and CMS Choice   Choice offered to / list presented to : Patient  Discharge Placement                         Discharge Plan and Services Additional resources added to the After Visit Summary for                  DME Arranged: Walker rolling, Bedside commode DME Agency: [facility] Date DME Agency Contacted: 04/28/23 Time DME Agency Contacted: [phone] Representative spoke with at DME Agency: Ian Malkin HH Arranged: PT HH Agency: [facility] Date [facility] Agency Contacted: 04/28/23 Time HH Agency Contacted: 1334 Representative spoke with at [facility] Agency: Tresa Endo  Social Determinants of Health (SDOH) Interventions SDOH Screenings   Food Insecurity: No Food Insecurity (04/26/2023)  Housing: Low Risk  (04/26/2023)  Transportation Needs: No Transportation Needs (04/26/2023)  Utilities: Not At Risk (04/26/2023)  Depression (PHQ2-9): Low Risk  (06/20/2022)  Recent Concern: Depression (PHQ2-9) -  Medium Risk (06/10/2022)  Financial Resource Strain: Low Risk  (06/20/2022)  Physical Activity: Insufficiently Active (12/08/2022)  Social Connections: Unknown (12/08/2022)  Stress: No Stress Concern Present (12/08/2022)  Tobacco Use: Low Risk  (04/27/2023)     Readmission Risk Interventions     No data to display

## 2023-04-28 NOTE — Progress Notes (Signed)
Lovenox teaching given. Patient verbalized understanding and all questions were answered.

## 2023-04-28 NOTE — Progress Notes (Addendum)
Subjective: 1 Day Post-Op s/p Procedure(s): PERCUTANEOUS PINNING HIP   Patient is alert, oriented. Reports pain to be 4/10. Denies chest pain, SOB, Calf pain. No nausea/vomiting. No other complaints. Concerned about how he is going to vote once he discharges from the hospital.   Objective:  PE: VITALS:   Vitals:   04/27/23 1131 04/27/23 2334 04/28/23 0120 04/28/23 0727  BP: 124/72 131/78 116/74 138/75  Pulse: 85 (!) 116 76 78  Resp: 17 16 17 17   Temp:  97.9 F (36.6 C) 97.8 F (36.6 C) 97.7 F (36.5 C)  TempSrc:  Oral  Oral  SpO2: 97% 99% 99% 93%  Weight:      Height:        ABD soft Sensation intact distally Intact pulses distally Dorsiflexion/Plantar flexion intact Incision: dressing C/D/I  LABS  Results for orders placed or performed during the hospital encounter of 04/25/23 (from the past 24 hour(s))  CBC     Status: None   Collection Time: 04/27/23 11:00 AM  Result Value Ref Range   WBC 9.0 4.0 - 10.5 K/uL   RBC 4.33 4.22 - 5.81 MIL/uL   Hemoglobin 13.5 13.0 - 17.0 g/dL   HCT 96.2 95.2 - 84.1 %   MCV 91.7 80.0 - 100.0 fL   MCH 31.2 26.0 - 34.0 pg   MCHC 34.0 30.0 - 36.0 g/dL   RDW 32.4 40.1 - 02.7 %   Platelets 182 150 - 400 K/uL   nRBC 0.0 0.0 - 0.2 %  Basic metabolic panel     Status: Abnormal   Collection Time: 04/27/23 11:00 AM  Result Value Ref Range   Sodium 135 135 - 145 mmol/L   Potassium 4.4 3.5 - 5.1 mmol/L   Chloride 102 98 - 111 mmol/L   CO2 23 22 - 32 mmol/L   Glucose, Bld 122 (H) 70 - 99 mg/dL   BUN 22 8 - 23 mg/dL   Creatinine, Ser 2.53 0.61 - 1.24 mg/dL   Calcium 8.1 (L) 8.9 - 10.3 mg/dL   GFR, Estimated >66 >44 mL/min   Anion gap 10 5 - 15  VITAMIN D 25 Hydroxy (Vit-D Deficiency, Fractures)     Status: None   Collection Time: 04/27/23 11:00 AM  Result Value Ref Range   Vit D, 25-Hydroxy 34.81 30 - 100 ng/mL  CBC     Status: None   Collection Time: 04/27/23  7:55 PM  Result Value Ref Range   WBC 6.8 4.0 - 10.5 K/uL   RBC  4.28 4.22 - 5.81 MIL/uL   Hemoglobin 13.2 13.0 - 17.0 g/dL   HCT 03.4 74.2 - 59.5 %   MCV 91.8 80.0 - 100.0 fL   MCH 30.8 26.0 - 34.0 pg   MCHC 33.6 30.0 - 36.0 g/dL   RDW 63.8 75.6 - 43.3 %   Platelets 218 150 - 400 K/uL   nRBC 0.0 0.0 - 0.2 %  Creatinine, serum     Status: None   Collection Time: 04/27/23  7:55 PM  Result Value Ref Range   Creatinine, Ser 1.19 0.61 - 1.24 mg/dL   GFR, Estimated >29 >51 mL/min    DG HIP UNILAT WITH PELVIS 2-3 VIEWS RIGHT  Result Date: 04/27/2023 CLINICAL DATA:  Right hip fracture. EXAM: DG HIP (WITH OR WITHOUT PELVIS) 3V RIGHT COMPARISON:  Intraoperative radiographs 04/27/2023. Right hip radiographs 04/25/2023. FINDINGS: Anterior plate and screw fixation across the right acetabulum and superior pubic ramus is stable. Three screws  are now present across the right femoral neck. IMPRESSION: 1. Interval ORIF of right femoral neck fracture. 2. Stable anterior plate and screw fixation across the right acetabulum and superior pubic ramus. Electronically Signed   By: Marin Roberts M.D.   On: 04/27/2023 13:11   DG HIP UNILAT WITH PELVIS 2-3 VIEWS RIGHT  Result Date: 04/27/2023 CLINICAL DATA:  Right hip fracture.  Status post ORIF. EXAM: DG HIP (WITH OR WITHOUT PELVIS) 2-3V RIGHT COMPARISON:  04/26/2023. FINDINGS: Three images obtained via portable C-arm radiography in the operating room show open reduction and internal fixation of the nondisplaced right hip fracture. Three Knowles pins have been placed across the fracture lying at the base of the femoral head. Fracture fragments and hardware components are in anatomic alignment. Signs of previous plate and screw fixation of the right hemipelvis. IMPRESSION: Status post open reduction and internal fixation of nondisplaced right hip fracture. Electronically Signed   By: Signa Kell M.D.   On: 04/27/2023 10:19   DG C-Arm 1-60 Min-No Report  Result Date: 04/27/2023 Fluoroscopy was utilized by the  requesting physician.  No radiographic interpretation.   MR HIP RIGHT WO CONTRAST  Result Date: 04/26/2023 CLINICAL DATA:  Larey Seat in bathroom yesterday. Suspected right hip fracture. EXAM: MR OF THE RIGHT HIP WITHOUT CONTRAST TECHNIQUE: Multiplanar, multisequence MR imaging was performed. No intravenous contrast was administered. COMPARISON:  CT scan 04/25/2023 FINDINGS: There is a minimally impacted subcapital fracture of right hip. Associated hemarthrosis. Significant artifact associated with the right-sided pelvic fixation hardware but no obvious pelvic fracture. The pubic symphysis and SI joints are intact. The left hip is unremarkable except for stable degenerative changes. No significant intrapelvic abnormalities are identified. IMPRESSION: 1. Minimally impacted subcapital fracture of the right hip. Associated hemarthrosis. 2. Significant artifact associated with the right-sided pelvic fixation hardware but no obvious pelvic fracture. 3. Stable degenerative changes involving both hips. Electronically Signed   By: Rudie Meyer M.D.   On: 04/26/2023 12:11    Assessment/Plan: Right femoral neck fracture in patient with history of right acetabulum ORIF done by Dr. Carola Frost in 2014  1 Day Post-Op s/p Procedure(s): PERCUTANEOUS PINNING HIP  Weightbearing: WBAT RLE Insicional and dressing care: Reinforce dressings as needed VTE prophylaxis: Lovenox 40mg  qd x 30 days Pain control: continue current regimen Follow - up plan: 2 weeks with Dr. Dion Saucier Dispo: pending PT evals today  Contact information:   Janine Ores, PA-C Weekdays 8-5  After hours and holidays please check [email or web address] for group call information for [facility]  Armida Sans 04/28/2023, 8:43 AM

## 2023-04-29 ENCOUNTER — Telehealth: Payer: Self-pay

## 2023-04-29 NOTE — Transitions of Care (Post Inpatient/ED Visit) (Signed)
04/29/2023  Name: Leslie Cox MRN: [id number] DOB: 15-Aug-1945  Today's TOC FU Call Status: Today's TOC FU Call Status:: Successful TOC FU Call Completed TOC FU Call Complete Date: 04/29/23 Patient's Name and Date of Birth confirmed.  Transition Care Management Follow-up Telephone Call Date of Discharge: 04/28/23 Discharge Facility: [facility]) Type of Discharge: Inpatient Admission Primary Inpatient Discharge Diagnosis:: Subcapital Fracture of Right Femur How have you been since you were released from the hospital?: Same Any questions or concerns?: No  Items Reviewed: Did you receive and understand the discharge instructions provided?: Yes Medications obtained,verified, and reconciled?: Yes (Medications Reviewed) Any new allergies since your discharge?: No Dietary orders reviewed?: Yes Type of Diet Ordered:: Heart healthy diet Do you have support at home?: Yes People in Home: spouse  Medications Reviewed Today: Medications Reviewed Today     Reviewed by Anthoney Harada, LPN (Licensed Practical Nurse) on 04/29/23 at 1108  Med List Status: <None>   Medication Order Taking? Sig Documenting Provider Last Dose Status Informant  acetaminophen (TYLENOL) 325 MG tablet [id number] Yes Take 1-2 tablets (325-650 mg total) by mouth every 6 (six) hours as needed for mild pain (pain score 1-3) or moderate pain (pain score 4-6) (pain score 1-3 or temp > 100.5). Max dose of tylenol should not exceed 3000 mg in 24 hours Janine Ores K, PA-C Taking Active   enoxaparin (LOVENOX) 40 MG/0.4ML injection [id number] Yes Inject 0.4 mLs (40 mg total) into the skin daily. Armida Sans, PA-C Taking Active   finasteride (PROSCAR) 5 MG tablet [id number] Yes Take 1 tablet (5 mg total) by mouth daily. [place], Alvira Philips, DO Taking Active   HYDROcodone-acetaminophen (NORCO/VICODIN) 5-325 MG tablet [id number] Yes Take 1 tablet by mouth every 6 (six) hours as needed for severe pain (pain score 7-10). Armida Sans, PA-C Taking Active   lisinopril-hydrochlorothiazide (ZESTORETIC) 20-12.5 MG tablet [id number] Yes Take 1 tablet by mouth daily. Jeani Sow, MD Taking Active Self, Pharmacy Records           Med Note [facility] Ronnald Nian   ZDG Apr 26, 2023  3:25 PM) Patient adamant of taking this medication. Dispense report does not support this claim.  Multiple Vitamin (MULTI VITAMIN) TABS [id number] Yes 1 tablet [provider] Taking Active Self, Pharmacy Records  pantoprazole (PROTONIX) 40 MG tablet [id number] Yes Take 1 tablet (40 mg total) by mouth daily. Jeani Sow, MD Taking Active Self, Pharmacy Records           Med Note [facility] Ronnald Nian   EPP Apr 26, 2023  3:25 PM) Patient adamant of taking this medication. Dispense report does not support this claim.  tamsulosin (FLOMAX) 0.4 MG CAPS capsule [id number] Yes Take 1 capsule (0.4 mg total) by mouth daily. Jeani Sow, MD Taking Active Self, Pharmacy Records           Med Note [facility] Ronnald Nian   SAY Apr 26, 2023  3:25 PM) Patient adamant of taking this medication. Dispense report does not support this claim.            Home Care and Equipment/Supplies: Were Home Health Services Ordered?: Yes Name of Home Health Agency:: [facility] Has Agency set up a time to come to your home?: No EMR reviewed for Home Health Orders: Orders present/patient has not received call (refer to CM for follow-up) Any new equipment or medical supplies ordered?: Yes Name of Medical supply agency?: n/a Were you able to  get the equipment/medical supplies?: Yes Do you have any questions related to the use of the equipment/supplies?: No  Functional Questionnaire: Do you need assistance with bathing/showering or dressing?: Yes Do you need assistance with meal preparation?: Yes Do you need assistance with eating?: No Do you have difficulty maintaining continence: No Do you need assistance with getting out of  bed/getting out of a chair/moving?: No Do you have difficulty managing or taking your medications?: No  Follow up appointments reviewed: PCP Follow-up appointment confirmed?: NA (patient declined to schedule appointment) Specialist Hospital Follow-up appointment confirmed?: No Follow-Up Specialty Provider:: Dr. Dion Saucier Reason Specialist Follow-Up Not Confirmed: Patient has Specialist Provider Number and will Call for Appointment Do you need transportation to your follow-up appointment?: No Do you understand care options if your condition(s) worsen?: Yes-patient verbalized understanding    SIGNATURE Kandis Fantasia, LPN [facility] Advisor [place] [facility] You Are. We Are. [facility] Direct Dial [phone]

## 2023-04-29 NOTE — Transitions of Care (Post Inpatient/ED Visit) (Signed)
04/29/2023  Name: Leslie Cox MRN: [id number] DOB: 08-Dec-1945  Today's TOC FU Call Status: Today's TOC FU Call Status:: Unsuccessful Call (1st Attempt) Unsuccessful Call (1st Attempt) Date: 04/29/23  Unsuccessful outreach attempt to engage pt for 30-day TOC program.  Follow Up Plan: Additional outreach attempts will be made to reach the patient to complete the Transitions of Care (Post Inpatient/ED visit) call.   Antionette Fairy, RN,BSN,CCM RN Care Manager Transitions of Care  [place]/Population Health  Direct Phone: [phone] Toll Free: [phone] Fax: [phone]

## 2023-04-30 ENCOUNTER — Telehealth: Payer: Self-pay

## 2023-04-30 NOTE — Transitions of Care (Post Inpatient/ED Visit) (Signed)
04/30/2023  Name: Tavita Rodela MRN: [id number] DOB: 1946/02/26  Today's TOC FU Call Status: Today's TOC FU Call Status:: Unsuccessful Call (3rd Attempt) Unsuccessful Call (3rd Attempt) Date: 04/30/23   Unsuccessful outreach attempt to engage pt for 30-day TOC program.   Follow Up Plan: No further outreach attempts will be made at this time. We have been unable to contact the patient.    Antionette Fairy, RN,BSN,CCM RN Care Manager Transitions of Care  [place]Population [facility]  Direct Phone: [phone] Toll Free: [phone] Fax: [phone]

## 2023-04-30 NOTE — Transitions of Care (Post Inpatient/ED Visit) (Addendum)
04/30/2023  Name: Leslie Cox MRN: [id number] DOB: 1945/07/03  Today's TOC FU Call Status: Today's TOC FU Call Status:: Unsuccessful Call (2nd Attempt) Unsuccessful Call (2nd Attempt) Date: 04/30/23  Unsuccessful outreach attempt to engage pt for 30-day TOC program.  Follow Up Plan: Additional outreach attempts will be made to reach the patient to complete the Transitions of Care (Post Inpatient/ED visit) call.     Antionette Fairy, RN,BSN,CCM RN Care Manager Transitions of Care  [place]/Population Health  Direct Phone: [phone] Toll Free: [phone] Fax: [phone]

## 2023-05-12 DIAGNOSIS — S72041A Displaced fracture of base of neck of right femur, initial encounter for closed fracture: Secondary | ICD-10-CM | POA: Diagnosis not present

## 2023-05-22 ENCOUNTER — Encounter: Payer: Self-pay | Admitting: Neurology

## 2023-05-22 ENCOUNTER — Ambulatory Visit: Payer: Medicare Other | Admitting: Neurology

## 2023-05-22 VITALS — BP 122/72 | Ht 66.0 in | Wt 147.0 lb

## 2023-05-22 DIAGNOSIS — I679 Cerebrovascular disease, unspecified: Secondary | ICD-10-CM | POA: Diagnosis not present

## 2023-05-22 DIAGNOSIS — I6381 Other cerebral infarction due to occlusion or stenosis of small artery: Secondary | ICD-10-CM

## 2023-05-22 MED ORDER — ASPIRIN 81 MG PO TBEC: 81.0000 mg | DELAYED_RELEASE_TABLET | Freq: Every day | ORAL | 3 refills | Status: AC

## 2023-05-22 NOTE — Progress Notes (Signed)
Chief Complaint  Patient presents with   New Patient (Initial Visit)    Rm 14, NP, chronic stroke, microvascular changes. With wife and sister in law,       ASSESSMENT AND PLAN  Leslie Cox is a 77 y.o. 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   MEDICAL HISTORY:  Leslie Cox, is a 77 year old male, seen in request by her primary care doctor Jeani Sow for evaluation of abnormal MRI scan, she is accompanied by his wife and sister-in-law at today's visit May 22, 2023    History is obtained from the patient and review of electronic medical records. I personally reviewed pertinent available imaging films in PACS.   PMHx of  Prostate hypertrophy HTN GERD Right hip fracture in 2014, then fracture again had percutaneous pinning in Apr 27 2023.  He recently fell had right hip fracture, required percutaneous pinning on April 27, 2023, taking Lovenox subcutaneous 40 mg daily as DVT prevention  He had a gradual onset hearing loss, for that reason he had MRI of the brain with without contrast on February 03, 2023, I do not have the film from [facility] on he had the report, mild to moderate generalized atrophy, multifocal encephalomalacia, lacunar infarction at left genu of internal capsule, bilateral caudate nuclei, small vessel disease  He is not on any antiplatelet agent treatment, prior to his injury, he  still work as a Conservation officer, nature at [employer], slow reaction noted during today's visit, wife reported that is just due to hearing loss  Laboratory evaluation since June 2024, normal A1c, CMP, PSA, CBC, lipid panel showed mild elevation of LDL 124, triglyceride of 189,   PHYSICAL EXAM:   Vitals:   05/22/23 1406  BP: 122/72  Weight: 147 lb (66.7 kg)  Height: 5\' 6"  (1.676 m)   Not recorded     Body mass index is 23.73 kg/m.  PHYSICAL EXAMNIATION:  Gen: NAD, conversant, well nourised, well groomed                     Cardiovascular: Regular rate rhythm, no peripheral edema, warm, nontender. Eyes: Conjunctivae clear without exudates or hemorrhage Neck: Supple, no carotid bruits. Pulmonary: Clear to auscultation bilaterally   NEUROLOGICAL EXAM:  MENTAL STATUS: Speech/cognition: Hard of hearing, slow reaction time, oriented to history taking CRANIAL NERVES: CN II: Visual fields are full to confrontation. Pupils are round equal and briskly reactive to light. CN III, IV, VI: extraocular movement are normal. No ptosis. CN V: Facial sensation is intact to light touch CN VII: Face is symmetric with normal eye closure  CN VIII: Hard of hearing,   CN IX, X: Phonation is normal. CN XI: Head turning and shoulder shrug are intact  MOTOR: There is no pronator drift of out-stretched arms. Muscle bulk and tone are normal. Muscle strength is normal.  REFLEXES: Reflexes are 2+ and symmetric at the biceps, triceps, knees, and ankles. Plantar responses are flexor.  SENSORY: Intact to light  touch, pinprick and vibratory sensation are intact in fingers and toes.  COORDINATION: There is no trunk or limb dysmetria noted.  GAIT/STANCE: Push-up to get up from seated position, rely on his cane, antalgic due to right hip pain  REVIEW OF SYSTEMS:  Full 14 system review of systems performed and notable only for as above All other review of systems were negative.   ALLERGIES: No Known  Allergies  HOME MEDICATIONS: Current Outpatient Medications  Medication Sig Dispense Refill   acetaminophen (TYLENOL) 325 MG tablet Take 1-2 tablets (325-650 mg total) by mouth every 6 (six) hours as needed for mild pain (pain score 1-3) or moderate pain (pain score 4-6) (pain score 1-3 or temp > 100.5). Max dose of tylenol should not exceed 3000 mg in 24 hours 60 tablet 0   enoxaparin (LOVENOX) 40 MG/0.4ML injection Inject 0.4 mLs (40 mg total) into the skin daily. 12 mL 0   finasteride (PROSCAR) 5 MG tablet Take 1 tablet (5 mg total) by mouth daily.     HYDROcodone-acetaminophen (NORCO/VICODIN) 5-325 MG tablet Take 1 tablet by mouth every 6 (six) hours as needed for severe pain (pain score 7-10). 20 tablet 0   lisinopril-hydrochlorothiazide (ZESTORETIC) 20-12.5 MG tablet Take 1 tablet by mouth daily. 90 tablet 3   Multiple Vitamin (MULTI VITAMIN) TABS 1 tablet     pantoprazole (PROTONIX) 40 MG tablet Take 1 tablet (40 mg total) by mouth daily. 90 tablet 3   tamsulosin (FLOMAX) 0.4 MG CAPS capsule Take 1 capsule (0.4 mg total) by mouth daily. 90 capsule 3   No current facility-administered medications for this visit.    PAST MEDICAL HISTORY: Past Medical History:  Diagnosis Date   GERD (gastroesophageal reflux disease)    Hypertension    PARESTHESIA 03/16/2010   Qualifier: Diagnosis of  By: Amador Cunas  MD, Janett Labella    Prostate cancer [facility])    Right acetabular fracture (HCC) 09/15/2012   Unspecified vitamin D deficiency 09/14/2012    PAST SURGICAL HISTORY: Past Surgical History:  Procedure Laterality Date   EXTERNAL FIXATION LEG Right 09/05/2012   Procedure: Application of traction bow externally;  Surgeon: Shelda Pal, MD;  Location: [facility] OR;  Service: Orthopedics;  Laterality: Right;   ORIF ACETABULAR FRACTURE Right 09/08/2012   Procedure: OPEN REDUCTION INTERNAL FIXATION (ORIF) ACETABULAR FRACTURE;  Surgeon: Budd Palmer, MD;  Location: [facility] OR;  Service: Orthopedics;   Laterality: Right;   PERCUTANEOUS PINNING Right 04/27/2023   Procedure: PERCUTANEOUS PINNING HIP;  Surgeon: Teryl Lucy, MD;  Location: [facility] OR;  Service: Orthopedics;  Laterality: Right;   TONSILLECTOMY      FAMILY HISTORY: Family History  Problem Relation Age of Onset   Breast cancer Mother    Cancer Neg Hx    Colon cancer Neg Hx     SOCIAL HISTORY: Social History   Socioeconomic History   Marital status: Married    Spouse name: Not on file   Number of children: 1   Years of education: Not on file   Highest education level: 12th grade  Occupational History   Occupation: Lobbyist: [employer]   Occupation: Lobbyist: [employer]  Tobacco Use   Smoking status: Never   Smokeless tobacco: Never  Vaping Use   Vaping status: Never Used  Substance and Sexual Activity   Alcohol use: No   Drug use: No   Sexual activity: Not on file  Other Topics Concern   Not  on file  Social History Narrative   2 grands   Retired-food Geophysicist/field seismologist   Lives with wife   Caffeine-2 cups daily   Recent rip hip surgery   Social Determinants of Health   Financial Resource Strain: Low Risk  (06/20/2022)   Overall Financial Resource Strain (CARDIA)    Difficulty of Paying Living Expenses: Not hard at all  Food Insecurity: No Food Insecurity (04/26/2023)   Hunger Vital Sign    Worried About Running Out of Food in the Last Year: Never true    Ran Out of Food in the Last Year: Never true  Transportation Needs: No Transportation Needs (04/26/2023)   PRAPARE - Administrator, Civil Service (Medical): No    Lack of Transportation (Non-Medical): No  Physical Activity: Insufficiently Active (12/08/2022)   Exercise Vital Sign    Days of Exercise per Week: 3 days    Minutes of Exercise per Session: 30 min  Stress: No Stress Concern Present (12/08/2022)   [employer] of Occupational Health - Occupational Stress Questionnaire    Feeling of Stress : Not  at all  Social Connections: Unknown (12/08/2022)   Social Connection and Isolation Panel [NHANES]    Frequency of Communication with Friends and Family: Patient declined    Frequency of Social Gatherings with Friends and Family: Patient declined    Attends Religious Services: Patient declined    Database administrator or Organizations: Patient declined    Attends Banker Meetings: Never    Marital Status: Married  Catering manager Violence: Not At Risk (04/26/2023)   Humiliation, Afraid, Rape, and Kick questionnaire    Fear of Current or Ex-Partner: No    Emotionally Abused: No    Physically Abused: No    Sexually Abused: No      Levert Feinstein, M.D. Ph.D.  [facility] [address] [place], [place] [address] Ph: [phone] Fax: [phone]  CC:  Jeani Sow, MD [address] [place],  [place] [address]  Jeani Sow, MD

## 2023-06-06 ENCOUNTER — Telehealth: Payer: Self-pay | Admitting: Family Medicine

## 2023-06-06 NOTE — Telephone Encounter (Signed)
Pt would like to transfer to dr [place]

## 2023-06-11 NOTE — Telephone Encounter (Signed)
Fine with me. BJ 

## 2023-06-11 NOTE — Telephone Encounter (Signed)
Pt has been sch

## 2023-06-13 DIAGNOSIS — S72041D Displaced fracture of base of neck of right femur, subsequent encounter for closed fracture with routine healing: Secondary | ICD-10-CM | POA: Diagnosis not present

## 2023-06-17 ENCOUNTER — Ambulatory Visit (HOSPITAL_COMMUNITY)
Admission: RE | Admit: 2023-06-17 | Discharge: 2023-06-17 | Disposition: A | Discharge status: Home / Self Care | Payer: Medicare Other | Source: Ambulatory Visit | Attending: Neurology | Admitting: Neurology

## 2023-06-17 ENCOUNTER — Ambulatory Visit (HOSPITAL_COMMUNITY)
Admission: RE | Admit: 2023-06-17 | Discharge: 2023-06-17 | Discharge status: Home / Self Care | Payer: Medicare Other | Source: Ambulatory Visit | Attending: Neurology | Admitting: Neurology

## 2023-06-17 DIAGNOSIS — I10 Essential (primary) hypertension: Secondary | ICD-10-CM | POA: Diagnosis not present

## 2023-06-17 DIAGNOSIS — Z8673 Personal history of transient ischemic attack (TIA), and cerebral infarction without residual deficits: Secondary | ICD-10-CM | POA: Diagnosis not present

## 2023-06-17 DIAGNOSIS — I6381 Other cerebral infarction due to occlusion or stenosis of small artery: Secondary | ICD-10-CM

## 2023-06-17 DIAGNOSIS — I6389 Other cerebral infarction: Secondary | ICD-10-CM | POA: Diagnosis not present

## 2023-06-17 DIAGNOSIS — I679 Cerebrovascular disease, unspecified: Secondary | ICD-10-CM

## 2023-06-18 LAB — ECHOCARDIOGRAM COMPLETE
AR max vel: 3.4 cm2
AV Area VTI: 3.26 cm2
AV Area mean vel: 3.55 cm2
AV Mean grad: 3 mm[Hg]
AV Peak grad: 4.9 mm[Hg]
Ao pk vel: 1.11 m/s
Area-P 1/2: 5.27 cm2
S' Lateral: 2.6 cm

## 2023-07-29 ENCOUNTER — Ambulatory Visit: Payer: Medicare Other | Admitting: Physician Assistant

## 2023-08-05 ENCOUNTER — Encounter: Payer: Self-pay | Admitting: Family Medicine

## 2023-08-05 ENCOUNTER — Ambulatory Visit (INDEPENDENT_AMBULATORY_CARE_PROVIDER_SITE_OTHER): Payer: Medicare Other | Admitting: Family Medicine

## 2023-08-05 VITALS — BP 120/80 | HR 70 | Resp 16 | Ht 66.0 in | Wt 149.5 lb

## 2023-08-05 DIAGNOSIS — M25512 Pain in left shoulder: Secondary | ICD-10-CM | POA: Diagnosis not present

## 2023-08-05 DIAGNOSIS — K148 Other diseases of tongue: Secondary | ICD-10-CM | POA: Diagnosis not present

## 2023-08-05 DIAGNOSIS — I679 Cerebrovascular disease, unspecified: Secondary | ICD-10-CM

## 2023-08-05 DIAGNOSIS — I10 Essential (primary) hypertension: Secondary | ICD-10-CM | POA: Diagnosis not present

## 2023-08-05 DIAGNOSIS — E785 Hyperlipidemia, unspecified: Secondary | ICD-10-CM | POA: Diagnosis not present

## 2023-08-05 DIAGNOSIS — C61 Malignant neoplasm of prostate: Secondary | ICD-10-CM

## 2023-08-05 LAB — BASIC METABOLIC PANEL
BUN: 26 mg/dL — ABNORMAL HIGH (ref 6–23)
CO2: 27 meq/L (ref 19–32)
Calcium: 9.2 mg/dL (ref 8.4–10.5)
Chloride: 101 meq/L (ref 96–112)
Creatinine, Ser: 1.03 mg/dL (ref 0.40–1.50)
GFR: 71.24 mL/min (ref >60.00)
Glucose, Bld: 106 mg/dL — ABNORMAL HIGH (ref 70–99)
Potassium: 4.2 meq/L (ref 3.5–5.1)
Sodium: 138 meq/L (ref 135–145)

## 2023-08-05 LAB — LIPID PANEL
Cholesterol: 198 mg/dL (ref 0–200)
HDL: 45.7 mg/dL (ref >39.00)
LDL Cholesterol: 131 mg/dL — ABNORMAL HIGH (ref 0–99)
NonHDL: 151.99
Total CHOL/HDL Ratio: 4
Triglycerides: 106 mg/dL (ref 0.0–149.0)
VLDL: 21.2 mg/dL (ref 0.0–40.0)

## 2023-08-05 MED ORDER — DICLOFENAC SODIUM 1 % EX GEL: 2.0000 g | Freq: Four times a day (QID) | CUTANEOUS | 1 refills | Status: AC

## 2023-08-05 MED ORDER — NYSTATIN 100000 UNIT/ML MT SUSP: 5.0000 mL | Freq: Four times a day (QID) | OROMUCOSAL | 0 refills | Status: AC

## 2023-08-05 NOTE — Progress Notes (Signed)
 HPI: Leslie Cox is a 78 y.o. male with a PMHx significant for HTN, CVA, prostate cancer, BPH, sensorineural hearing loss, and HLD who is here today to establish care.  Former PCP: Jenkins Earnie Carrel, MD Last preventive routine visit: 12/09/2022  Chronic medical problems:   Hypertension:  Medications: Currently on lisinopril -hydrochlorothiazide  20-12.5 mg daily.  Checking BP at home: He occasionally checks his BP at home, but has not lately.  Vision: UTD on routine vision care.  Negative for unusual or severe headache, visual changes, exertional chest pain, dyspnea,  focal weakness, or edema.  Lab Results  Component Value Date   CREATININE 1.05 04/28/2023   BUN 22 04/28/2023   NA 133 (L) 04/28/2023   K 4.1 04/28/2023   CL 104 04/28/2023   CO2 23 04/28/2023   GERD:  Currently on pantoprazole  40 mg daily.  Follows with GI, last seen on 01/23/23  Prostate cancer:  Prostate adenocarcinoma Dx'ed in 09/2022.He opted for conservative treatment, surveillance. Currently on Proscar  5 mg daily and Flomax  0.4 mg daily.  Follows with urologist. Last PSA 0.9 in 04/2023.  Hearing loss:  He is getting hearing aids through the [facility] on 2/20.  He endorses persistent tinnitus.  Follows regularly with audiology and has seen ENT.   CVD He saw neurology in 05/2023 because an MRI had shown a past lacunar infarction.  He is not currently on statin medication, but is taking aspirin  81 mg daily.  Lab Results  Component Value Date   CHOL 204 (H) 12/09/2022   HDL 41.60 12/09/2022   LDLCALC 124 (H) 12/09/2022   TRIG 189.0 (H) 12/09/2022   CHOLHDL 5 12/09/2022   Vit D deficiency:Not taking vitamin D  supplementation.  Lab Results  Component Value Date   VD25OH 34.81 04/27/2023   Left shoulder pain:  Patient complains of intermittent left shoulder pain for about a month. He says the pain occurs at night when lying down at night. It is not interfering with his sleep. He rates the pain as a  4-5/10.  No problems during the day.  He has been taking tylenol  for his pain.  No recent injuries.  He has not noted edema or erythema.  Review of Systems  Constitutional:  Negative for activity change, appetite change and fever.  HENT:  Negative for nosebleeds and sore throat.   Respiratory:  Negative for cough and wheezing.   Gastrointestinal:  Negative for abdominal pain, nausea and vomiting.  Endocrine: Negative for cold intolerance and heat intolerance.  Genitourinary:  Negative for decreased urine volume, dysuria and hematuria.  Musculoskeletal:  Positive for arthralgias. Negative for gait problem.  Neurological:  Negative for syncope and facial asymmetry.  Psychiatric/Behavioral:  Negative for confusion and hallucinations.   See other pertinent positives and negatives in HPI.  Current Outpatient Medications on File Prior to Visit  Medication Sig Dispense Refill   acetaminophen  (TYLENOL ) 325 MG tablet Take 1-2 tablets (325-650 mg total) by mouth every 6 (six) hours as needed for mild pain (pain score 1-3) or moderate pain (pain score 4-6) (pain score 1-3 or temp > 100.5). Max dose of tylenol  should not exceed 3000 mg in 24 hours 60 tablet 0   aspirin  EC 81 MG tablet Take 1 tablet (81 mg total) by mouth daily. Swallow whole. 90 tablet 3   enoxaparin  (LOVENOX ) 40 MG/0.4ML injection Inject 0.4 mLs (40 mg total) into the skin daily. 12 mL 0   finasteride  (PROSCAR ) 5 MG tablet Take 1 tablet (5 mg total)  by mouth daily.     HYDROcodone -acetaminophen  (NORCO/VICODIN) 5-325 MG tablet Take 1 tablet by mouth every 6 (six) hours as needed for severe pain (pain score 7-10). 20 tablet 0   lisinopril -hydrochlorothiazide  (ZESTORETIC ) 20-12.5 MG tablet Take 1 tablet by mouth daily. 90 tablet 3   Multiple Vitamin (MULTI VITAMIN) TABS 1 tablet     pantoprazole  (PROTONIX ) 40 MG tablet Take 1 tablet (40 mg total) by mouth daily. 90 tablet 3   tamsulosin  (FLOMAX ) 0.4 MG CAPS capsule Take 1 capsule (0.4  mg total) by mouth daily. 90 capsule 3   No current facility-administered medications on file prior to visit.   Past Medical History:  Diagnosis Date   GERD (gastroesophageal reflux disease)    Hypertension    PARESTHESIA 03/16/2010   Qualifier: Diagnosis of  By: Jame  MD, Maude FALCON    Prostate cancer [facility])    Right acetabular fracture (HCC) 09/15/2012   Unspecified vitamin D  deficiency 09/14/2012   No Known Allergies  Family History  Problem Relation Age of Onset   Breast cancer Mother    Cancer Neg Hx    Colon cancer Neg Hx    Social History   Socioeconomic History   Marital status: Married    Spouse name: Not on file   Number of children: 1   Years of education: Not on file   Highest education level: 12th grade  Occupational History   Occupation: Lobbyist: [employer]   Occupation: Lobbyist: [employer]  Tobacco Use   Smoking status: Never   Smokeless tobacco: Never  Vaping Use   Vaping status: Never Used  Substance and Sexual Activity   Alcohol use: No   Drug use: No   Sexual activity: Not on file  Other Topics Concern   Not on file  Social History Narrative   2 grands   Retired-food geophysicist/field seismologist   Lives with wife   Caffeine-2 cups daily   Recent rip hip surgery   Social Drivers of Health   Financial Resource Strain: Low Risk  (06/20/2022)   Overall Financial Resource Strain (CARDIA)    Difficulty of Paying Living Expenses: Not hard at all  Food Insecurity: No Food Insecurity (04/26/2023)   Hunger Vital Sign    Worried About Running Out of Food in the Last Year: Never true    Ran Out of Food in the Last Year: Never true  Transportation Needs: No Transportation Needs (04/26/2023)   PRAPARE - Administrator, Civil Service (Medical): No    Lack of Transportation (Non-Medical): No  Physical Activity: Insufficiently Active (12/08/2022)   Exercise Vital Sign    Days of Exercise per Week: 3 days    Minutes  of Exercise per Session: 30 min  Stress: No Stress Concern Present (12/08/2022)   [employer] of Occupational Health - Occupational Stress Questionnaire    Feeling of Stress : Not at all  Social Connections: Unknown (12/08/2022)   Social Connection and Isolation Panel [NHANES]    Frequency of Communication with Friends and Family: Patient declined    Frequency of Social Gatherings with Friends and Family: Patient declined    Attends Religious Services: Patient declined    Database Administrator or Organizations: Patient declined    Attends Banker Meetings: Not on file    Marital Status: Married   Today's Vitals   08/05/23 0840  BP: 120/80  Pulse: 70  Resp: 16  SpO2: 98%  Weight: 149 lb 8 oz (67.8 kg)  Height: 5' 6 (1.676 m)   Body mass index is 24.13 kg/m.  Physical Exam Vitals and nursing note reviewed.  Constitutional:      General: He is not in acute distress.    Appearance: He is well-developed.  HENT:     Head: Normocephalic and atraumatic.     Mouth/Throat:     Mouth: Mucous membranes are moist.     Tongue: Lesions present. Tongue does not deviate from midline.     Pharynx: Oropharynx is clear.   Eyes:     Conjunctiva/sclera:     Right eye: Right conjunctiva is injected.     Left eye: Left conjunctiva is injected.     Comments: Bilateral conjunctival injection L>R  Cardiovascular:     Rate and Rhythm: Normal rate and regular rhythm.     Pulses:          Posterior tibial pulses are 2+ on the right side and 2+ on the left side.     Heart sounds: No murmur heard. Pulmonary:     Effort: Pulmonary effort is normal. No respiratory distress.     Breath sounds: Normal breath sounds.  Abdominal:     Palpations: Abdomen is soft. There is no hepatomegaly or mass.     Tenderness: There is no abdominal tenderness.  Musculoskeletal:     Left shoulder: No bony tenderness. Normal range of motion.     Right lower leg: No edema.     Left lower leg: No  edema.     Comments: Left shoulder: No pain with palpation. Vonzell' test positive , drop arm rotator cuff test negative, empty can supraspinatus test positive,  lift-Off Subscapularis test elicits mild pain.  Lymphadenopathy:     Cervical: No cervical adenopathy.  Skin:    General: Skin is warm.     Findings: No erythema or rash.  Neurological:     Mental Status: He is alert and oriented to person, place, and time.     Gait: Gait normal.  Psychiatric:        Mood and Affect: Mood and affect normal.    ASSESSMENT AND PLAN:  Mr. Diantonio was seen today to establish care.  Lab Results  Component Value Date   CHOL 198 08/05/2023   HDL 45.70 08/05/2023   LDLCALC 131 (H) 08/05/2023   TRIG 106.0 08/05/2023   CHOLHDL 4 08/05/2023   Lab Results  Component Value Date   NA 138 08/05/2023   CL 101 08/05/2023   K 4.2 08/05/2023   CO2 27 08/05/2023   BUN 26 (H) 08/05/2023   CREATININE 1.03 08/05/2023   GFR 71.24 08/05/2023   CALCIUM  9.2 08/05/2023   ALBUMIN  4.6 12/09/2022   GLUCOSE 106 (H) 08/05/2023   Left shoulder pain, unspecified chronicity ? Rotator cuff tendinitis, it is going on for a month. I do not think imaging is needed at this time. He is not interested in PT or sport med referral. ROM exercises recommended. Topical Voltaren  qid prn.  -     Diclofenac  Sodium; Apply 2 g topically 4 (four) times daily.  Dispense: 150 g; Refill: 1  Lesion of tongue He is not aware of lesions, asymptomatic. ? Thrush. Trial of topical Nystatin  recommended. Monitor for changes.  -     Nystatin ; Take 5 mLs (500,000 Units total) by mouth 4 (four) times daily for 14 days.  Dispense: 473 mL; Refill: 0  Primary hypertension Assessment &  Plan: BP otherwise adequately controlled. Continue lisinopril -hydrochlorothiazide  20-12.5 mg daily and low salt diet. F/U in 6 months.  Orders: -     Basic metabolic panel; Future  Cerebral vascular disease Assessment & Plan: Brain MRI 01/2023: Old  lacunar infection genu left internal capsule. 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  No residual deficit. Currently on Aspirin  81 mg daily, he is not on statin. Evaluated by neurologist on 05/22/23.  Orders: -     Lipid panel; Future  Hyperlipidemia, unspecified hyperlipidemia type Assessment & Plan: Last LDL 124 in 11/2022. + CVD. We discussed some CV benefits of statins. Further recommendations according to FLP result.  Orders: -     Lipid panel; Future  Prostate cancer (HCC)  Adenocarcinoma Dx'ed in 09/2022. He opted not to treat with radiation or surgery. Follows with urologist regularly.  Return in about 6 months (around 02/02/2024) for chronic problems.  I, Leonce PARAS Wierda, acting as a scribe for Viktor Philipp, MD., have documented all relevant documentation on the behalf of Wildon Cuevas, MD, as directed by  Lennon Richins, MD while in the presence of Aquinnah Devin, MD.   I, Ashia Dehner, MD, have reviewed all documentation for this visit. The documentation on 08/05/23 for the exam, diagnosis, procedures, and orders are all accurate and complete.  Yoshiko Keleher G. Koben Daman, MD  [facility] [place] office.

## 2023-08-05 NOTE — Assessment & Plan Note (Signed)
Adenocarcinoma Dx'ed in 09/2022. He opted not to treat with radiation or surgery. Follows with urologist regularly.

## 2023-08-05 NOTE — Assessment & Plan Note (Signed)
Last LDL 124 in 11/2022. + CVD. We discussed some CV benefits of statins. Further recommendations according to FLP result.

## 2023-08-05 NOTE — Assessment & Plan Note (Addendum)
Brain MRI 01/2023: Old lacunar infection genu left internal capsule. 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  No residual deficit. Currently on Aspirin 81 mg daily, he is not on statin. Evaluated by neurologist on 05/22/23.

## 2023-08-05 NOTE — Assessment & Plan Note (Signed)
BP otherwise adequately controlled. Continue lisinopril-hydrochlorothiazide 20-12.5 mg daily and low salt diet. F/U in 6 months.

## 2023-08-05 NOTE — Patient Instructions (Addendum)
 A few things to remember from today's visit:  Lesion of tongue  Primary hypertension - Plan: Basic metabolic panel  Cerebral vascular disease - Plan: Lipid panel  Hyperlipidemia, unspecified hyperlipidemia type - Plan: Lipid panel  Left shoulder pain, unspecified chronicity - Plan: diclofenac  Sodium (VOLTAREN ) 1 % GEL  We will need cholesterol meds top prevent future strokes. Monitor lesions on tongue. Range of motion exercises for left shoulder and topical voltaren .  If you need refills for medications you take chronically, please call your pharmacy. Do not use My Chart to request refills or for acute issues that need immediate attention. If you send a my chart message, it may take a few days to be addressed, specially if I am not in the office.  Please be sure medication list is accurate. If a new problem present, please set up appointment sooner than planned today.

## 2023-08-08 DIAGNOSIS — S72041D Displaced fracture of base of neck of right femur, subsequent encounter for closed fracture with routine healing: Secondary | ICD-10-CM | POA: Diagnosis not present

## 2023-08-11 ENCOUNTER — Telehealth: Payer: Self-pay | Admitting: Family Medicine

## 2023-08-11 MED ORDER — ROSUVASTATIN CALCIUM 10 MG PO TABS: 10.0000 mg | ORAL_TABLET | Freq: Every day | ORAL | 3 refills | Status: DC

## 2023-08-11 NOTE — Telephone Encounter (Signed)
 Copied from CRM [id number]. Topic: General - Call Back - No Documentation >> Aug 11, 2023  2:08 PM Marlan Silva wrote: Reason for CRM: Patient states that he had a missed call. Patient is requesting a call back.

## 2023-08-11 NOTE — Telephone Encounter (Signed)
 See result note.

## 2023-08-23 ENCOUNTER — Other Ambulatory Visit: Payer: Self-pay | Admitting: Adult Health

## 2023-08-23 DIAGNOSIS — C61 Malignant neoplasm of prostate: Secondary | ICD-10-CM

## 2023-09-07 ENCOUNTER — Other Ambulatory Visit: Payer: Self-pay | Admitting: Family Medicine

## 2023-09-07 DIAGNOSIS — K219 Gastro-esophageal reflux disease without esophagitis: Secondary | ICD-10-CM

## 2023-09-15 ENCOUNTER — Other Ambulatory Visit: Payer: Self-pay

## 2023-09-15 DIAGNOSIS — N401 Enlarged prostate with lower urinary tract symptoms: Secondary | ICD-10-CM

## 2023-09-15 MED ORDER — TAMSULOSIN HCL 0.4 MG PO CAPS: 0.4000 mg | ORAL_CAPSULE | Freq: Every day | ORAL | 3 refills | Status: AC

## 2023-10-06 ENCOUNTER — Ambulatory Visit
Admission: RE | Admit: 2023-10-06 | Discharge: 2023-10-06 | Disposition: A | Discharge status: Home / Self Care | Payer: Medicare Other | Source: Ambulatory Visit | Attending: Adult Health | Admitting: Adult Health

## 2023-10-06 DIAGNOSIS — C61 Malignant neoplasm of prostate: Secondary | ICD-10-CM

## 2023-10-06 MED ORDER — GADOPICLENOL 0.5 MMOL/ML IV SOLN
7.0000 mL | Freq: Once | INTRAVENOUS | Status: AC | PRN
  Administered 2023-10-06: 7 mL via INTRAVENOUS

## 2023-10-13 ENCOUNTER — Encounter: Payer: Self-pay | Admitting: Family Medicine

## 2023-10-15 MED ORDER — LISINOPRIL-HYDROCHLOROTHIAZIDE 20-12.5 MG PO TABS
1.0000 | ORAL_TABLET | Freq: Every day | ORAL | 3 refills | Status: AC
Start: 2023-10-15 — End: ?

## 2023-10-24 DIAGNOSIS — R3911 Hesitancy of micturition: Secondary | ICD-10-CM | POA: Diagnosis not present

## 2023-10-24 DIAGNOSIS — R300 Dysuria: Secondary | ICD-10-CM | POA: Diagnosis not present

## 2024-03-16 ENCOUNTER — Encounter: Payer: Self-pay | Admitting: Family Medicine

## 2024-03-16 ENCOUNTER — Ambulatory Visit: Admitting: Family Medicine

## 2024-03-16 VITALS — BP 128/80 | HR 79 | Temp 97.6°F | Resp 16 | Ht 66.0 in | Wt 151.4 lb

## 2024-03-16 DIAGNOSIS — S40869A Insect bite (nonvenomous) of unspecified upper arm, initial encounter: Secondary | ICD-10-CM | POA: Diagnosis not present

## 2024-03-16 DIAGNOSIS — L989 Disorder of the skin and subcutaneous tissue, unspecified: Secondary | ICD-10-CM | POA: Diagnosis not present

## 2024-03-16 DIAGNOSIS — R0789 Other chest pain: Secondary | ICD-10-CM | POA: Diagnosis not present

## 2024-03-16 DIAGNOSIS — B078 Other viral warts: Secondary | ICD-10-CM

## 2024-03-16 DIAGNOSIS — W57XXXA Bitten or stung by nonvenomous insect and other nonvenomous arthropods, initial encounter: Secondary | ICD-10-CM

## 2024-03-16 DIAGNOSIS — Z23 Encounter for immunization: Secondary | ICD-10-CM

## 2024-03-16 NOTE — Patient Instructions (Addendum)
 A few things to remember from today's visit:  Insect bite of upper arm, unspecified laterality, initial encounter  Other chest pain  You can try over the counter Cortizone cream 2 times daily for 2 weeks and Zyrtec 10 mg daily for a couple weeks. Insect repellent to prevent bites. Monitor for new symptoms.  If chest pain presents again you need to see immediate medical attention.  If you need refills for medications you take chronically, please call your pharmacy. Do not use My Chart to request refills or for acute issues that need immediate attention. If you send a my chart message, it may take a few days to be addressed, specially if I am not in the office.  Please be sure medication list is accurate. If a new problem present, please set up appointment sooner than planned today.

## 2024-03-16 NOTE — Progress Notes (Unsigned)
 ACUTE VISIT Chief Complaint  Patient presents with   Rash   HPI: [name] is a 78 y.o. male, who is here today complaining of *** Rash   Discussed the use of AI scribe software for clinical note transcription with the patient, who gave verbal consent to proceed.  History of Present Illness Leslie Cox is a 78 year old male who presents with a skin rash and itching for the past two weeks.  He has been experiencing a skin rash for the past two weeks, which is itchy and located on his left leg and left arm. The rash is new, and he denies any recent changes in medications, detergents, or travel that could have triggered it. He has not used any over-the-counter treatments for the rash. The itching is more pronounced at night, sometimes affecting his sleep. Some lesions have improved spontaneously without treatment.  He mentions a skin tag on his trunk, which is not itchy and is different from the rash. He has not sought treatment for the skin tag and is unsure if it requires removal.  Additionally, he experienced a brief episode of chest pain on the left side, which occurred without any specific activity and lasted only a few minutes. No associated symptoms such as shortness of breath, palpitations, or radiation of the pain to the jaw or arm. The pain was mild and achy.  He reports a cracking sound in his left jaw when chewing, but denies any pain or limitation of movement.  No history of eczema, fever, chills, changes in appetite, or lesions in the mouth. He has not noticed similar rashes in anyone close to him.   Review of Systems  Skin:  Positive for rash.   See other pertinent positives and negatives in HPI.  Current Outpatient Medications on File Prior to Visit  Medication Sig Dispense Refill   aspirin  EC 81 MG tablet Take 1 tablet (81 mg total) by mouth daily. Swallow whole. 90 tablet 3   diclofenac  Sodium (VOLTAREN ) 1 % GEL Apply 2 g topically 4 (four) times daily. 150 g  1   finasteride  (PROSCAR ) 5 MG tablet Take 1 tablet (5 mg total) by mouth daily.     lisinopril -hydrochlorothiazide  (ZESTORETIC ) 20-12.5 MG tablet Take 1 tablet by mouth daily. 90 tablet 3   Multiple Vitamin (MULTI VITAMIN) TABS 1 tablet     rosuvastatin  (CRESTOR ) 10 MG tablet Take 1 tablet (10 mg total) by mouth daily. 90 tablet 3   tamsulosin  (FLOMAX ) 0.4 MG CAPS capsule Take 1 capsule (0.4 mg total) by mouth daily. 90 capsule 3   pantoprazole  (PROTONIX ) 40 MG tablet TAKE 1 TABLET BY MOUTH DAILY 100 tablet 2   No current facility-administered medications on file prior to visit.    Past Medical History:  Diagnosis Date   GERD (gastroesophageal reflux disease)    Hypertension    PARESTHESIA 03/16/2010   Qualifier: Diagnosis of  By: Jame  MD, Maude FALCON    Prostate cancer [facility])    Right acetabular fracture (HCC) 09/15/2012   Unspecified vitamin D  deficiency 09/14/2012   No Known Allergies  Social History   Socioeconomic History   Marital status: Married    Spouse name: Not on file   Number of children: 1   Years of education: Not on file   Highest education level: 12th grade  Occupational History   Occupation: Lobbyist: [employer]   Occupation: Lobbyist: [employer]  Tobacco Use  Smoking status: Never   Smokeless tobacco: Never  Vaping Use   Vaping status: Never Used  Substance and Sexual Activity   Alcohol use: No   Drug use: No   Sexual activity: Not on file  Other Topics Concern   Not on file  Social History Narrative   2 grands   Retired-food Geophysicist/field seismologist   Lives with wife   Caffeine-2 cups daily   Recent rip hip surgery   Social Drivers of Health   Financial Resource Strain: Low Risk  (06/20/2022)   Overall Financial Resource Strain (CARDIA)    Difficulty of Paying Living Expenses: Not hard at all  Food Insecurity: No Food Insecurity (04/26/2023)   Hunger Vital Sign    Worried About Running Out of Food in the Last  Year: Never true    Ran Out of Food in the Last Year: Never true  Transportation Needs: No Transportation Needs (04/26/2023)   PRAPARE - Administrator, Civil Service (Medical): No    Lack of Transportation (Non-Medical): No  Physical Activity: Insufficiently Active (12/08/2022)   Exercise Vital Sign    Days of Exercise per Week: 3 days    Minutes of Exercise per Session: 30 min  Stress: No Stress Concern Present (12/08/2022)   [employer] of Occupational Health - Occupational Stress Questionnaire    Feeling of Stress : Not at all  Social Connections: Unknown (12/08/2022)   Social Connection and Isolation Panel    Frequency of Communication with Friends and Family: Patient declined    Frequency of Social Gatherings with Friends and Family: Patient declined    Attends Religious Services: Patient declined    Database administrator or Organizations: Patient declined    Attends Banker Meetings: Not on file    Marital Status: Married    Vitals:   03/16/24 1423  BP: 128/80  Pulse: 79  Resp: 16  Temp: 97.6 F (36.4 C)  SpO2: 97%   Body mass index is 24.44 kg/m.  Physical Exam Vitals and nursing note reviewed.  Constitutional:      General: He is not in acute distress.    Appearance: He is well-developed.  HENT:     Head: Normocephalic and atraumatic.  Eyes:     Conjunctiva/sclera: Conjunctivae normal.  Neck:     Trachea: No tracheal deviation.  Pulmonary:     Effort: Pulmonary effort is normal. No respiratory distress.     Breath sounds: Normal breath sounds.  Lymphadenopathy:     Cervical: No cervical adenopathy.  Skin:    General: Skin is warm.     Findings: Rash present. No erythema. Rash is papular. Rash is not vesicular.         Comments: 3-4 mm pediculated, firm lesion.***  Neurological:     General: No focal deficit present.     Mental Status: He is oriented to person, place, and time.  Psychiatric:     Comments: Well groomed, good  eye contact.     ASSESSMENT AND PLAN: Insect bite of upper arm, unspecified laterality, initial encounter  Other chest pain  Other viral warts  Need for vaccination -     Flu vaccine HIGH DOSE PF(Fluzone Trivalent)    Return if symptoms worsen or fail to improve, for keep next appointment.  Jacquelyn Antony G. [place], MD  [facility]. [place] office.  Discharge Instructions   None

## 2024-03-18 DIAGNOSIS — L989 Disorder of the skin and subcutaneous tissue, unspecified: Secondary | ICD-10-CM | POA: Diagnosis not present

## 2024-03-18 DIAGNOSIS — Z23 Encounter for immunization: Secondary | ICD-10-CM | POA: Diagnosis not present

## 2024-03-18 DIAGNOSIS — S40869A Insect bite (nonvenomous) of unspecified upper arm, initial encounter: Secondary | ICD-10-CM | POA: Diagnosis not present

## 2024-04-28 ENCOUNTER — Ambulatory Visit: Payer: Self-pay

## 2024-04-28 NOTE — Telephone Encounter (Signed)
 FYI Only or Action Required?: FYI only for provider: appointment scheduled on 04/29/2024.  Patient was last seen in primary care on 03/16/2024 by Jordan, Betty G, MD.  Called Nurse Triage reporting Dizziness.  Symptoms began today.  Interventions attempted: Nothing.  Symptoms are: unchanged.  Triage Disposition: See PCP When Office is Open (Within 3 Days)  Patient/caregiver understands and will follow disposition?: Yes      Copied from CRM [id number]. Topic: Clinical - Red Word Triage >> Apr 28, 2024  2:17 PM Alexandria E wrote: [facility] that prompted transfer to Nurse Triage: Dizziness, started 5-10 minutes ago with a blood pressure reading of 154/82. Reason for Disposition  [1] MILD dizziness (e.g., walking normally) AND [2] has NOT been evaluated by doctor (or NP/PA) for this  (Exception: Dizziness caused by heat exposure, sudden standing, or poor fluid intake.)  Answer Assessment - Initial Assessment Questions Pt states he takes lisinopril  for BP, denies missing any recent doses. BP 154/82 and 153/90 HR 83  today. Pt advised to go to ED/UC for worsening symptoms. Currently denies any weakness or numbness and states symptoms are mild.   1. DESCRIPTION: Describe your dizziness.     Feeling woozy  2. LIGHTHEADED: Do you feel lightheaded? (e.g., somewhat faint, woozy, weak upon standing)     Denies feeling lightheaded but states he feels  3. VERTIGO: Do you feel like either you or the room is spinning or tilting? (i.e., vertigo)     Denies  4. SEVERITY: How bad is it?  Do you feel like you are going to faint? Can you stand and walk?     Can walk around fine  5. ONSET:  When did the dizziness begin?     5-10 minutes  6. AGGRAVATING FACTORS: Does anything make it worse? (e.g., standing, change in head position)     Standing  7. HEART RATE: Can you tell me your heart rate? How many beats in 15 seconds?  (Note: Not all patients can do this.)       Denies feeling  like it is racing  8. CAUSE: What do you think is causing the dizziness? (e.g., decreased fluids or food, diarrhea, emotional distress, heat exposure, new medicine, sudden standing, vomiting; unknown)     Unknown  9. RECURRENT SYMPTOM: Have you had dizziness before? If Yes, ask: When was the last time? What happened that time?     Denies  10. OTHER SYMPTOMS: Do you have any other symptoms? (e.g., fever, chest pain, vomiting, diarrhea, bleeding)       Feels sweaty  Protocols used: Dizziness - Lightheadedness-A-AH

## 2024-04-29 ENCOUNTER — Encounter: Payer: Self-pay | Admitting: Family Medicine

## 2024-04-29 ENCOUNTER — Ambulatory Visit: Admitting: Family Medicine

## 2024-04-29 VITALS — BP 120/72 | HR 95 | Temp 98.8°F | Wt 150.8 lb

## 2024-04-29 DIAGNOSIS — I10 Essential (primary) hypertension: Secondary | ICD-10-CM | POA: Diagnosis not present

## 2024-04-29 DIAGNOSIS — R42 Dizziness and giddiness: Secondary | ICD-10-CM

## 2024-04-29 DIAGNOSIS — Z91138 Patient's unintentional underdosing of medication regimen for other reason: Secondary | ICD-10-CM

## 2024-04-29 NOTE — Progress Notes (Signed)
 Established Patient Office Visit   Subjective  Patient ID: Leslie Cox, male    DOB: 04-01-1946  Age: 78 y.o. MRN: [id number]  Chief Complaint  Patient presents with   Acute Visit    Dizziness started yesterday, patient's Bp was 154/78, patient did not take Bp meds that day     Patient is a 78 year old male followed by Dr. Jordan and seen for acute concern.  Patient states he felt dizzy yesterday, unclear on time of day, possibly early afternoon.  BP yesterday was 154/78 at the time.  Pt realized he may have forgotten to take 4 different medications including his bp med.  Pt also had not eaten anything.  Feeling better today.  Took meds.  Bp readings at home now normal 1teens-130/70.  Pt denies HAs, CP, n/v, changes in vision.  Mentions he read online that looking at the ipad for too long can also cause dizziness.  Pt inquires about what he can take for back pain from arthritis.    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   Status post open reduction and internal fixation (ORIF) of fracture 04/26/2023   Fall at home, initial encounter 04/26/2023   BPH (benign prostatic hyperplasia) 04/26/2023   Subcapital fracture of femur, right, closed, initial encounter (HCC) 04/26/2023   Closed right hip fracture (HCC) 04/26/2023   Hyperglycemia 12/09/2022   Prostate cancer (HCC) 12/09/2022   Benign prostatic hyperplasia with nocturia 12/10/2021   Bilateral shoulder pain 08/21/2018   Abnormal x-ray 07/14/2018   Right inguinal hernia 02/23/2014   Hypertension 10/02/2012   Acid reflux 09/07/2012   Past Medical History:  Diagnosis Date   GERD (gastroesophageal reflux disease)    Hypertension    PARESTHESIA 03/16/2010   Qualifier: Diagnosis of  By: Jame  MD, Maude FALCON    Prostate cancer [facility])    Right acetabular fracture (HCC) 09/15/2012   Unspecified vitamin D   deficiency 09/14/2012   Past Surgical History:  Procedure Laterality Date   EXTERNAL FIXATION LEG Right 09/05/2012   Procedure: Application of traction bow externally;  Surgeon: Donnice JONETTA Car, MD;  Location: [facility] OR;  Service: Orthopedics;  Laterality: Right;   ORIF ACETABULAR FRACTURE Right 09/08/2012   Procedure: OPEN REDUCTION INTERNAL FIXATION (ORIF) ACETABULAR FRACTURE;  Surgeon: Ozell VEAR Bruch, MD;  Location: [facility] OR;  Service: Orthopedics;  Laterality: Right;   PERCUTANEOUS PINNING Right 04/27/2023   Procedure: PERCUTANEOUS PINNING HIP;  Surgeon: Josefina Chew, MD;  Location: [facility] OR;  Service: Orthopedics;  Laterality: Right;   TONSILLECTOMY     Social History   Tobacco Use   Smoking status: Never   Smokeless tobacco: Never  Vaping Use   Vaping status: Never Used  Substance Use Topics   Alcohol use: No   Drug use: No   Family History  Problem Relation Age of Onset   Breast cancer Mother    Cancer Neg Hx    Colon cancer Neg Hx    No Known Allergies  ROS Negative unless stated above    Objective:     BP 120/72 (BP Location: Left Arm, Patient Position: Sitting, Cuff Size: Large)   Pulse 95   Temp 98.8 F (37.1 C)   Wt 150 lb 12.8 oz (68.4 kg)   SpO2 98%   BMI 24.34 kg/m  BP Readings from Last 3 Encounters:  04/29/24 120/72  03/16/24 128/80  08/05/23 120/80  Wt Readings from Last 3 Encounters:  04/29/24 150 lb 12.8 oz (68.4 kg)  03/16/24 151 lb 6.4 oz (68.7 kg)  08/05/23 149 lb 8 oz (67.8 kg)      Physical Exam Constitutional:      General: He is not in acute distress.    Appearance: Normal appearance.  HENT:     Head: Normocephalic and atraumatic.     Nose: Nose normal.     Mouth/Throat:     Mouth: Mucous membranes are moist.  Eyes:     Extraocular Movements:     Left eye: No nystagmus.  Cardiovascular:     Rate and Rhythm: Normal rate and regular rhythm.     Heart sounds: Normal heart sounds. No murmur heard.    No gallop.  Pulmonary:      Effort: Pulmonary effort is normal. No respiratory distress.     Breath sounds: Normal breath sounds. No wheezing, rhonchi or rales.  Skin:    General: Skin is warm and dry.  Neurological:     Mental Status: He is alert and oriented to person, place, and time.        08/05/2023    8:51 AM 06/20/2022   10:33 AM 06/10/2022    9:00 AM  Depression screen PHQ 2/9  Decreased Interest 0 0 0  Down, Depressed, Hopeless 0 0 0  PHQ - 2 Score 0 0 0  Altered sleeping  0 3  Tired, decreased energy  0 0  Change in appetite  0 0  Feeling bad or failure about yourself   0 1  Trouble concentrating  0 0  Moving slowly or fidgety/restless  0 3  Suicidal thoughts  0 0  PHQ-9 Score  0 7  Difficult doing work/chores  Not difficult at all Somewhat difficult       No data to display           No results found for any visits on 04/29/24.    Assessment & Plan:   Dizziness  Medication dose missed  Essential hypertension  Acute episode of dizziness yesterday like due to missed medications and hypoglycemia/dehydration.  Pt encouraged to an alarm in his phone to remind him to take medications.  Pt also encouraged to eat consistent meals and stay hydrated.  Continue monitoring bp which is now controlled.  OTC meds including topicals and heat advised for back sx.  Return if symptoms worsen or fail to improve.   Clotilda JONELLE Single, MD

## 2024-05-04 ENCOUNTER — Telehealth: Payer: Self-pay | Admitting: *Deleted

## 2024-05-04 NOTE — Telephone Encounter (Signed)
 Copied from CRM [id number]. Topic: General - Other >> May 04, 2024 11:16 AM Carlyon D wrote: Reason for CRM: Jenaea from [facility] calling in regards to seeing pt today for follow up on prostate cancer had two falls in last 3 weeks one being last week and the other being a couple days ago. Did urine test No infection in urine. Jenaea from [facility]  feels the pt needs to be seen by pcp as pt usually does not fall like this. Please call wife # to get pt scheduled [phone] Sharlee Wife)  Call back # [phone] option 6

## 2024-05-05 NOTE — Telephone Encounter (Signed)
 Please schedule for acute visit. If any MS change or focal weakness, he needs to go to the ED. Thanks, BJ

## 2024-05-06 ENCOUNTER — Other Ambulatory Visit: Payer: Self-pay | Admitting: Adult Health

## 2024-05-06 DIAGNOSIS — C61 Malignant neoplasm of prostate: Secondary | ICD-10-CM

## 2024-05-06 NOTE — Telephone Encounter (Signed)
 Appointment scheduled for tomorrow, May 07, 2024 at 1430 with Dr. Jordan.

## 2024-05-07 ENCOUNTER — Ambulatory Visit (INDEPENDENT_AMBULATORY_CARE_PROVIDER_SITE_OTHER)

## 2024-05-07 ENCOUNTER — Encounter: Payer: Self-pay | Admitting: Family Medicine

## 2024-05-07 ENCOUNTER — Ambulatory Visit (INDEPENDENT_AMBULATORY_CARE_PROVIDER_SITE_OTHER): Admitting: Family Medicine

## 2024-05-07 VITALS — BP 130/88 | HR 78 | Temp 98.1°F | Resp 16 | Ht 66.0 in | Wt 150.4 lb

## 2024-05-07 DIAGNOSIS — N481 Balanitis: Secondary | ICD-10-CM

## 2024-05-07 DIAGNOSIS — R296 Repeated falls: Secondary | ICD-10-CM

## 2024-05-07 DIAGNOSIS — I10 Essential (primary) hypertension: Secondary | ICD-10-CM

## 2024-05-07 DIAGNOSIS — L989 Disorder of the skin and subcutaneous tissue, unspecified: Secondary | ICD-10-CM

## 2024-05-07 DIAGNOSIS — R682 Dry mouth, unspecified: Secondary | ICD-10-CM

## 2024-05-07 DIAGNOSIS — M5442 Lumbago with sciatica, left side: Secondary | ICD-10-CM

## 2024-05-07 DIAGNOSIS — M5441 Lumbago with sciatica, right side: Secondary | ICD-10-CM | POA: Diagnosis not present

## 2024-05-07 MED ORDER — PREDNISONE 20 MG PO TABS: ORAL_TABLET | ORAL | 0 refills | Status: AC

## 2024-05-07 NOTE — Patient Instructions (Addendum)
 A few things to remember from today's visit:  Acute bilateral low back pain with bilateral sciatica - Plan: DG Lumbar Spine Complete, Ambulatory referral to Physical Therapy, predniSONE  (DELTASONE ) 20 MG tablet  Falls frequently - Plan: Basic metabolic panel with GFR, CBC, Ambulatory referral to Physical Therapy  Non-healing skin lesion - Plan: Ambulatory referral to Dermatology  Primary hypertension  Prednisone  with breakfast. Topical icy hot and tylenol  500 mg 3-4 times per day as needed. Continue following with urologist for urinary concerns. No changes in rest.  If you need refills for medications you take chronically, please call your pharmacy. Do not use My Chart to request refills or for acute issues that need immediate attention. If you send a my chart message, it may take a few days to be addressed, specially if I am not in the office.  Please be sure medication list is accurate. If a new problem present, please set up appointment sooner than planned today.

## 2024-05-07 NOTE — Progress Notes (Signed)
 ACUTE VISIT Chief Complaint  Patient presents with   Fall    Pt is here with wife, Tien Spooner. Wife reports fall this week and one last month.  Was at [employer], miss a step and fell forward. Had mark on forehead, L knee and nose. Pt states his balance is not good, has to go slower   Discussed the use of AI scribe software for clinical note transcription with the patient, who gave verbal consent to proceed.  History of Present Illness Leslie Cox is a 78 year old male a PMHx significant for HTN, CVA, prostate cancer, BPH, sensorineural hearing loss, and HLD who presents with frequent falls and back pain. Recently he was at his urologist's office and message was sent concerning frequent falls. UA at that time was not suggestive of UTI.  He states that he has experienced two falls in the past month. The first fall occurred while leaving his sister's house, and he is unsure if he tripped or lost balance. The second fall happened this week at a [facility] store, where he fell headfirst onto the sidewalk, impacting his nose and forehead. He is able to get up by himself after falling.  No disorientation, headache, visual changes, nausea, vomiting, chest pain, difficulty breathing, palpitations, or bladder/bowel incontinence during these episodes.   He feels off balance and walks slower since the falls, with increased awareness of steps.  No current dizziness but had an episode of elevated SBP to 154 mmHg, attributed to missing his medication that day. His blood pressure has since stabilized. He is currently on Lisinopril -hydrochlorothiazide  20-12.5 mg daily.  Component     Latest Ref Rng 08/05/2023  Sodium     134 - 144 mmol/L 138   Potassium     3.5 - 5.2 mmol/L 4.2   Chloride     96 - 106 mmol/L 101   CO2     20 - 29 mmol/L 27   Glucose     70 - 99 mg/dL 893 (H)   BUN     8 - 27 mg/dL 26 (H)   Creatinine     0.76 - 1.27 mg/dL 8.96   GFR     >39.99 mL/min 71.24    Calcium      8.6 - 10.2 mg/dL 9.2     -He has been experiencing lower back pain for the past one to two weeks, which started before the falls. The pain is described as burning and is located on both sides of the lower back, sometimes radiating to the legs. No numbness or tingling in the legs but notes that his right foot becomes tingly when sitting on the toilet for extended periods. He had a lumbar MRI done years ago, 05/2004 due to lower back pain.  IMPRESSION:  1. 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 This could be symptomatic.   2. Mild disc bulge and facet degenerative changes at L4-5.  3. Mild disc bulge at L3-4.   4. Desiccation and mild bulging of the disc at T12-L1  He has not seen an orthopedist for his back.  He reports a had a hip fracture from a fall in 05/2023 and underwent surgery. Last follow up with ortho 06/13/23.  He has not had recent blood work since February/2025.  No fever, chills, or decreased appetite. He mentions feeling chilled this morning but without fever.  -He has  a persistent skin lesion LLE since his last visit, 03/16/24, when he was seen for rash; initially thought to be an insect bite. Rest of skin lesions have healed and resolved but one on inner aspect of left distal LE is still present. He denies irritating lesion with frequent scratching, today lesion bled with light scratch.  -He experiences dry mouth, which has been ongoing for a long time, years. It is worse in the morning when he wakes up.  He has been trying to increase his fluid intake recently. No oral lesions.  Review of Systems  Constitutional:  Negative for activity change, appetite change and unexpected weight change.  HENT:  Negative for sore throat and trouble swallowing.   Respiratory:  Negative for cough and wheezing.   Gastrointestinal:  Negative for abdominal pain, nausea and vomiting.   Endocrine: Negative for cold intolerance and heat intolerance.  Genitourinary:  Negative for decreased urine volume, dysuria and hematuria.  Neurological:  Negative for syncope, facial asymmetry and weakness.  Psychiatric/Behavioral:  Negative for confusion and hallucinations.   See other pertinent positives and negatives in HPI.  Current Outpatient Medications on File Prior to Visit  Medication Sig Dispense Refill   aspirin  EC 81 MG tablet Take 1 tablet (81 mg total) by mouth daily. Swallow whole. 90 tablet 3   diclofenac  Sodium (VOLTAREN ) 1 % GEL Apply 2 g topically 4 (four) times daily. 150 g 1   finasteride  (PROSCAR ) 5 MG tablet Take 1 tablet (5 mg total) by mouth daily.     lisinopril -hydrochlorothiazide  (ZESTORETIC ) 20-12.5 MG tablet Take 1 tablet by mouth daily. 90 tablet 3   Multiple Vitamin (MULTI VITAMIN) TABS 1 tablet     pantoprazole  (PROTONIX ) 40 MG tablet TAKE 1 TABLET BY MOUTH DAILY 100 tablet 2   rosuvastatin  (CRESTOR ) 10 MG tablet Take 1 tablet (10 mg total) by mouth daily. 90 tablet 3   tamsulosin  (FLOMAX ) 0.4 MG CAPS capsule Take 1 capsule (0.4 mg total) by mouth daily. 90 capsule 3   No current facility-administered medications on file prior to visit.   Past Medical History:  Diagnosis Date   GERD (gastroesophageal reflux disease)    Hypertension    PARESTHESIA 03/16/2010   Qualifier: Diagnosis of  By: Jame  MD, Maude FALCON    Prostate cancer [facility])    Right acetabular fracture (HCC) 09/15/2012   Unspecified vitamin D  deficiency 09/14/2012   No Known Allergies  Social History   Socioeconomic History   Marital status: Married    Spouse name: Not on file   Number of children: 1   Years of education: Not on file   Highest education level: 12th grade  Occupational History   Occupation: Lobbyist: [employer]   Occupation: Lobbyist: [employer]  Tobacco Use   Smoking status: Never   Smokeless tobacco: Never  Vaping Use    Vaping status: Never Used  Substance and Sexual Activity   Alcohol use: No   Drug use: No   Sexual activity: Not on file  Other Topics Concern   Not on file  Social History Narrative   2 grands   Retired-food geophysicist/field seismologist   Lives with wife   Caffeine-2 cups daily   Recent rip hip surgery   Social Drivers of Health   Financial Resource Strain: Patient Declined (05/06/2024)   Overall Financial Resource Strain (CARDIA)    Difficulty of Paying Living Expenses: Patient declined  Food Insecurity: Patient Declined (  05/06/2024)   Hunger Vital Sign    Worried About Running Out of Food in the Last Year: Patient declined    Ran Out of Food in the Last Year: Patient declined  Transportation Needs: No Transportation Needs (05/06/2024)   PRAPARE - Administrator, Civil Service (Medical): No    Lack of Transportation (Non-Medical): No  Physical Activity: Insufficiently Active (05/06/2024)   Exercise Vital Sign    Days of Exercise per Week: 2 days    Minutes of Exercise per Session: 30 min  Stress: Patient Declined (05/06/2024)   [employer] of Occupational Health - Occupational Stress Questionnaire    Feeling of Stress: Patient declined  Social Connections: Unknown (05/06/2024)   Social Connection and Isolation Panel    Frequency of Communication with Friends and Family: Patient declined    Frequency of Social Gatherings with Friends and Family: Patient declined    Attends Religious Services: Patient declined    Active Member of Clubs or Organizations: Patient declined    Attends Banker Meetings: Not on file    Marital Status: Patient declined    Today's Vitals   05/07/24 1448 05/07/24 1508  BP: (!) 136/90 130/88  Pulse: 78   Resp: 16   Temp: 98.1 F (36.7 C)   TempSrc: Oral   SpO2: 97%   Weight: 150 lb 6.4 oz (68.2 kg)   Height: 5' 6 (1.676 m)    Body mass index is 24.28 kg/m.  Physical Exam Vitals and nursing note reviewed.  Constitutional:       General: He is not in acute distress.    Appearance: He is well-developed.  HENT:     Head: Normocephalic and atraumatic.     Right Ear: Tympanic membrane and external ear normal.     Left Ear: External ear normal.     Mouth/Throat:     Mouth: Mucous membranes are moist.  Eyes:     Conjunctiva/sclera: Conjunctivae normal.  Cardiovascular:     Rate and Rhythm: Normal rate and regular rhythm.     Heart sounds: No murmur heard.    Comments: DP pulses palpable. Pulmonary:     Effort: Pulmonary effort is normal. No respiratory distress.     Breath sounds: Normal breath sounds.  Abdominal:     Palpations: Abdomen is soft. There is no hepatomegaly or mass.     Tenderness: There is no abdominal tenderness.  Musculoskeletal:     Lumbar back: Tenderness present. No bony tenderness. Negative right straight leg raise test and negative left straight leg raise test.     Comments: Pain elicited with movement on exam table during examination. No local edema or erythema appreciated, no suspicious lesions.  Skin:    General: Skin is warm.     Findings: No erythema.  Neurological:     General: No focal deficit present.     Mental Status: He is alert and oriented to person, place, and time.     Comments: Slow, mildly unstable gait not assisted.  Psychiatric:        Mood and Affect: Mood and affect normal.    ASSESSMENT AND PLAN:  Mr. Geroldine was seen for falls and back pain + other concerns. Orders Placed This Encounter  Procedures   DG Lumbar Spine Complete   Basic metabolic panel with GFR   CBC   Ambulatory referral to Dermatology   Ambulatory referral to Physical Therapy   Lab Results  Component Value Date   NA  132 (L) 05/07/2024   CL 93 (L) 05/07/2024   K 4.8 05/07/2024   CO2 23 05/07/2024   BUN 26 05/07/2024   CREATININE 1.05 05/07/2024   EGFR 74 05/07/2024   CALCIUM  9.6 05/07/2024   ALBUMIN  4.6 12/09/2022   GLUCOSE 98 05/07/2024   Lab Results  Component Value Date    WBC 6.8 05/07/2024   HGB 13.3 05/07/2024   HCT 41.2 05/07/2024   MCV 93 05/07/2024   PLT 262 05/07/2024   Acute bilateral low back pain with bilateral sciatica Reported as new 1-2 weeks but before he started falling, which he reports has been going on for the past month. He had a lumbar MRI in 2005 , which showed some abnormalities, DDD. Lumbar X ray ordered today. Tylenol  500 mg 3-4 times per day as needed and topical icy hot.  Burning like sensation radiated to LE's. After discussing some side effects he agrees with taking Prednisone . PT will also be arranged.  -     DG Lumbar Spine Complete; Future -     Ambulatory referral to Physical Therapy -     predniSONE ; 2 tabs for 4 days,1 tabs for 3 days, and 1/2 tab for 3 days. Take tables together with breakfast.  Dispense: 13 tablet; Refill: 0  Falls frequently Fall precautions discussed. PT will be arranged. UA at his urologist's office negative for UTI. Further recommendations according to lab results.  -     Basic metabolic panel with GFR; Future -     CBC; Future -     Ambulatory referral to Physical Therapy  Non-healing skin lesion LLE skin lesion for 1-2 months. We discussed differential Dx, concerning for SCC. Dermatology referral placed.  -     Ambulatory referral to Dermatology  Primary hypertension Assessment & Plan: BP initially elevated, improved after a few minutes. Continue Lisinopril -hydrochlorothiazide  20-12.5 mg daily as well as low salt diet. Continue monitoring BP regularly.  Dry mouth Chronic. Not noted on examination today. We discussed possible causes, including dehydration, mouth breathing,and meds.  Return in about 2 months (around 07/07/2024), or if symptoms worsen or fail to improve.  Joachim Carton G. Carliyah Cotterman, MD  [facility]. [place] office.

## 2024-05-08 ENCOUNTER — Ambulatory Visit: Payer: Self-pay | Admitting: Family Medicine

## 2024-05-08 DIAGNOSIS — R918 Other nonspecific abnormal finding of lung field: Secondary | ICD-10-CM

## 2024-05-08 LAB — CBC
Hematocrit: 41.2 % (ref 37.5–51.0)
Hemoglobin: 13.3 g/dL (ref 13.0–17.7)
MCH: 30.2 pg (ref 26.6–33.0)
MCHC: 32.3 g/dL (ref 31.5–35.7)
MCV: 93 fL (ref 79–97)
Platelets: 262 x10E3/uL (ref 150–450)
RBC: 4.41 x10E6/uL (ref 4.14–5.80)
RDW: 11.6 % (ref 11.6–15.4)
WBC: 6.8 x10E3/uL (ref 3.4–10.8)

## 2024-05-08 LAB — BASIC METABOLIC PANEL WITH GFR
BUN/Creatinine Ratio: 25 — ABNORMAL HIGH (ref 10–24)
BUN: 26 mg/dL (ref 8–27)
CO2: 23 mmol/L (ref 20–29)
Calcium: 9.6 mg/dL (ref 8.6–10.2)
Chloride: 93 mmol/L — ABNORMAL LOW (ref 96–106)
Creatinine, Ser: 1.05 mg/dL (ref 0.76–1.27)
Glucose: 98 mg/dL (ref 70–99)
Potassium: 4.8 mmol/L (ref 3.5–5.2)
Sodium: 132 mmol/L — ABNORMAL LOW (ref 134–144)
eGFR: 74 mL/min/1.73 (ref >59)

## 2024-05-08 NOTE — Assessment & Plan Note (Signed)
 BP initially elevated, improved after a few minutes. Continue Lisinopril -hydrochlorothiazide  20-12.5 mg daily as well as low salt diet. Continue monitoring BP regularly.

## 2024-05-11 ENCOUNTER — Ambulatory Visit (INDEPENDENT_AMBULATORY_CARE_PROVIDER_SITE_OTHER)
Admission: RE | Admit: 2024-05-11 | Discharge: 2024-05-11 | Disposition: A | Discharge status: Home / Self Care | Source: Ambulatory Visit | Attending: Family Medicine | Admitting: Family Medicine

## 2024-05-11 DIAGNOSIS — R918 Other nonspecific abnormal finding of lung field: Secondary | ICD-10-CM

## 2024-05-18 ENCOUNTER — Ambulatory Visit: Payer: Self-pay | Admitting: Family Medicine

## 2024-06-19 ENCOUNTER — Other Ambulatory Visit: Payer: Self-pay | Admitting: Family Medicine

## 2024-06-19 ENCOUNTER — Other Ambulatory Visit: Payer: Self-pay | Admitting: Family

## 2024-06-19 DIAGNOSIS — K219 Gastro-esophageal reflux disease without esophagitis: Secondary | ICD-10-CM

## 2024-07-05 ENCOUNTER — Inpatient Hospital Stay: Admission: RE | Admit: 2024-07-05 | Source: Ambulatory Visit

## 2024-07-05 DIAGNOSIS — C61 Malignant neoplasm of prostate: Secondary | ICD-10-CM

## 2024-07-05 MED ORDER — GADOPICLENOL 0.5 MMOL/ML IV SOLN
7.5000 mL | Freq: Once | INTRAVENOUS | Status: AC | PRN
  Administered 2024-07-05: 7.5 mL via INTRAVENOUS

## 2024-07-09 ENCOUNTER — Other Ambulatory Visit: Payer: Self-pay

## 2024-07-09 DIAGNOSIS — E785 Hyperlipidemia, unspecified: Secondary | ICD-10-CM

## 2024-07-09 MED ORDER — ROSUVASTATIN CALCIUM 10 MG PO TABS: 10.0000 mg | ORAL_TABLET | Freq: Every day | ORAL | 2 refills | Status: AC
